# Patient Record
Sex: Male | Born: 1935 | Race: White | Hispanic: No | Marital: Married | Smoking: Former smoker
Health system: Southern US, Community
[De-identification: ages and names within clinical notes are randomized; demographics above are authoritative.]

## PROBLEM LIST (undated history)

## (undated) DIAGNOSIS — H9190 Unspecified hearing loss, unspecified ear: Secondary | ICD-10-CM

## (undated) DIAGNOSIS — Z9889 Other specified postprocedural states: Secondary | ICD-10-CM

## (undated) DIAGNOSIS — M1990 Unspecified osteoarthritis, unspecified site: Secondary | ICD-10-CM

## (undated) DIAGNOSIS — G4730 Sleep apnea, unspecified: Secondary | ICD-10-CM

## (undated) DIAGNOSIS — I255 Ischemic cardiomyopathy: Secondary | ICD-10-CM

## (undated) DIAGNOSIS — I739 Peripheral vascular disease, unspecified: Secondary | ICD-10-CM

## (undated) DIAGNOSIS — K219 Gastro-esophageal reflux disease without esophagitis: Secondary | ICD-10-CM

## (undated) DIAGNOSIS — G629 Polyneuropathy, unspecified: Secondary | ICD-10-CM

## (undated) DIAGNOSIS — I480 Paroxysmal atrial fibrillation: Secondary | ICD-10-CM

## (undated) DIAGNOSIS — I2510 Atherosclerotic heart disease of native coronary artery without angina pectoris: Secondary | ICD-10-CM

## (undated) DIAGNOSIS — I10 Essential (primary) hypertension: Secondary | ICD-10-CM

## (undated) DIAGNOSIS — Z950 Presence of cardiac pacemaker: Secondary | ICD-10-CM

## (undated) DIAGNOSIS — I350 Nonrheumatic aortic (valve) stenosis: Secondary | ICD-10-CM

## (undated) DIAGNOSIS — E7800 Pure hypercholesterolemia, unspecified: Secondary | ICD-10-CM

## (undated) HISTORY — PX: REPLACEMENT TOTAL KNEE BILATERAL: SUR1225

## (undated) HISTORY — PX: MANDIBLE SURGERY: SHX707

## (undated) HISTORY — PX: JOINT REPLACEMENT: SHX530

## (undated) HISTORY — PX: NECK SURGERY: SHX720

## (undated) HISTORY — PX: KNEE SURGERY: SHX244

## (undated) HISTORY — PX: OTHER SURGICAL HISTORY: SHX169

## (undated) HISTORY — PX: INSERT / REPLACE / REMOVE PACEMAKER: SUR710

---

## 1998-05-09 ENCOUNTER — Encounter: Admission: RE | Admit: 1998-05-09 | Discharge: 1998-08-07 | Payer: Self-pay | Admitting: Pulmonary Disease

## 1999-03-08 ENCOUNTER — Encounter: Admission: RE | Admit: 1999-03-08 | Discharge: 1999-06-06 | Payer: Self-pay | Admitting: Anesthesiology

## 2001-11-13 HISTORY — PX: OTHER SURGICAL HISTORY: SHX169

## 2004-07-04 ENCOUNTER — Ambulatory Visit: Payer: Self-pay | Admitting: Unknown Physician Specialty

## 2006-04-23 ENCOUNTER — Ambulatory Visit: Payer: Self-pay | Admitting: Internal Medicine

## 2007-06-17 ENCOUNTER — Ambulatory Visit: Payer: Self-pay | Admitting: Vascular Surgery

## 2007-07-02 ENCOUNTER — Ambulatory Visit: Payer: Self-pay | Admitting: Unknown Physician Specialty

## 2010-06-15 ENCOUNTER — Ambulatory Visit: Payer: Self-pay | Admitting: Internal Medicine

## 2010-06-28 ENCOUNTER — Ambulatory Visit: Payer: Self-pay | Admitting: Pain Medicine

## 2010-06-28 ENCOUNTER — Ambulatory Visit: Payer: Self-pay | Admitting: Internal Medicine

## 2010-10-26 ENCOUNTER — Other Ambulatory Visit: Payer: Self-pay | Admitting: Internal Medicine

## 2010-10-26 DIAGNOSIS — M545 Low back pain: Secondary | ICD-10-CM

## 2010-11-01 ENCOUNTER — Other Ambulatory Visit: Payer: Self-pay | Admitting: Internal Medicine

## 2010-11-01 ENCOUNTER — Ambulatory Visit
Admission: RE | Admit: 2010-11-01 | Discharge: 2010-11-01 | Disposition: A | Discharge status: Home / Self Care | Payer: Medicare Other | Source: Ambulatory Visit | Attending: Internal Medicine | Admitting: Internal Medicine

## 2010-11-01 DIAGNOSIS — M545 Low back pain: Secondary | ICD-10-CM

## 2010-11-01 DIAGNOSIS — M549 Dorsalgia, unspecified: Secondary | ICD-10-CM

## 2010-11-22 ENCOUNTER — Ambulatory Visit
Admission: RE | Admit: 2010-11-22 | Discharge: 2010-11-22 | Disposition: A | Discharge status: Home / Self Care | Payer: Medicare Other | Source: Ambulatory Visit | Attending: Internal Medicine | Admitting: Internal Medicine

## 2010-11-22 DIAGNOSIS — M545 Low back pain: Secondary | ICD-10-CM

## 2011-04-23 ENCOUNTER — Other Ambulatory Visit: Payer: Self-pay | Admitting: Internal Medicine

## 2011-04-23 DIAGNOSIS — M549 Dorsalgia, unspecified: Secondary | ICD-10-CM

## 2011-04-26 ENCOUNTER — Ambulatory Visit
Admission: RE | Admit: 2011-04-26 | Discharge: 2011-04-26 | Disposition: A | Discharge status: Home / Self Care | Payer: Medicare Other | Source: Ambulatory Visit | Attending: Internal Medicine | Admitting: Internal Medicine

## 2011-04-26 DIAGNOSIS — M549 Dorsalgia, unspecified: Secondary | ICD-10-CM

## 2011-04-26 MED ORDER — IOHEXOL 180 MG/ML  SOLN
1.0000 mL | Freq: Once | INTRAMUSCULAR | Status: AC | PRN
  Administered 2011-04-26: 1 mL via EPIDURAL

## 2011-04-26 MED ORDER — METHYLPREDNISOLONE ACETATE 40 MG/ML INJ SUSP (RADIOLOG
120.0000 mg | Freq: Once | INTRAMUSCULAR | Status: AC
  Administered 2011-04-26: 120 mg via EPIDURAL

## 2011-09-01 ENCOUNTER — Encounter (HOSPITAL_COMMUNITY): Payer: Self-pay | Admitting: *Deleted

## 2011-09-01 ENCOUNTER — Emergency Department (HOSPITAL_COMMUNITY)
Admission: EM | Admit: 2011-09-01 | Discharge: 2011-09-01 | Disposition: A | Discharge status: Rehabilitation Facility | Payer: Medicare Other | Attending: Emergency Medicine | Admitting: Emergency Medicine

## 2011-09-01 DIAGNOSIS — I82409 Acute embolism and thrombosis of unspecified deep veins of unspecified lower extremity: Secondary | ICD-10-CM | POA: Insufficient documentation

## 2011-09-01 DIAGNOSIS — R609 Edema, unspecified: Secondary | ICD-10-CM | POA: Insufficient documentation

## 2011-09-01 DIAGNOSIS — Z96659 Presence of unspecified artificial knee joint: Secondary | ICD-10-CM | POA: Insufficient documentation

## 2011-09-01 DIAGNOSIS — M79609 Pain in unspecified limb: Secondary | ICD-10-CM | POA: Insufficient documentation

## 2011-09-01 DIAGNOSIS — E7800 Pure hypercholesterolemia, unspecified: Secondary | ICD-10-CM | POA: Insufficient documentation

## 2011-09-01 DIAGNOSIS — I10 Essential (primary) hypertension: Secondary | ICD-10-CM | POA: Insufficient documentation

## 2011-09-01 DIAGNOSIS — M25569 Pain in unspecified knee: Secondary | ICD-10-CM | POA: Insufficient documentation

## 2011-09-01 HISTORY — DX: Essential (primary) hypertension: I10

## 2011-09-01 HISTORY — DX: Pure hypercholesterolemia, unspecified: E78.00

## 2011-09-01 LAB — DIFFERENTIAL
Eosinophils Absolute: 0.5 10*3/uL (ref 0.0–0.7)
Eosinophils Relative: 5 % (ref 0–5)
Lymphs Abs: 1.5 10*3/uL (ref 0.7–4.0)
Monocytes Relative: 10 % (ref 3–12)

## 2011-09-01 LAB — CBC
Hemoglobin: 10.7 g/dL — ABNORMAL LOW (ref 13.0–17.0)
MCH: 30.9 pg (ref 26.0–34.0)
MCV: 89.6 fL (ref 78.0–100.0)
RBC: 3.46 MIL/uL — ABNORMAL LOW (ref 4.22–5.81)

## 2011-09-01 LAB — PROTIME-INR
INR: 1.17 (ref 0.00–1.49)
Prothrombin Time: 15.1 seconds (ref 11.6–15.2)

## 2011-09-01 LAB — BASIC METABOLIC PANEL
BUN: 13 mg/dL (ref 6–23)
Calcium: 9.1 mg/dL (ref 8.4–10.5)
GFR calc non Af Amer: 84 mL/min — ABNORMAL LOW (ref >90)
Glucose, Bld: 115 mg/dL — ABNORMAL HIGH (ref 70–99)
Potassium: 3.9 mEq/L (ref 3.5–5.1)

## 2011-09-01 MED ORDER — ENOXAPARIN SODIUM 80 MG/0.8ML ~~LOC~~ SOLN: 80.0000 mg | Freq: Two times a day (BID) | SUBCUTANEOUS | Status: DC

## 2011-09-01 MED ORDER — WARFARIN SODIUM 5 MG PO TABS
5.0000 mg | ORAL_TABLET | Freq: Once | ORAL | Status: AC
  Administered 2011-09-01: 5 mg via ORAL
  Filled 2011-09-01 (×2): qty 1

## 2011-09-01 MED ORDER — WARFARIN SODIUM 5 MG PO TABS: 5.0000 mg | ORAL_TABLET | Freq: Once | ORAL | Status: DC

## 2011-09-01 MED ORDER — ENOXAPARIN SODIUM 80 MG/0.8ML ~~LOC~~ SOLN
80.0000 mg | Freq: Once | SUBCUTANEOUS | Status: AC
  Administered 2011-09-01: 80 mg via SUBCUTANEOUS
  Filled 2011-09-01 (×2): qty 0.8

## 2011-09-01 MED ORDER — HYDROCODONE-ACETAMINOPHEN 5-325 MG PO TABS
2.0000 | ORAL_TABLET | Freq: Once | ORAL | Status: AC
  Administered 2011-09-01: 2 via ORAL
  Filled 2011-09-01: qty 2

## 2011-09-01 NOTE — ED Notes (Signed)
Per EMS, pt from [facility] c/o left knee pain and swelling for 2 days, pt had surgery on left knee at [facility] about a week ago.

## 2011-09-01 NOTE — ED Notes (Signed)
Vascular tech at bedside. °

## 2011-09-01 NOTE — ED Notes (Signed)
JYN:[id number]<AO> Expected date:<BR> Expected time:<BR> Means of arrival:<BR> Comments:<BR> EMS post surgery

## 2011-09-01 NOTE — ED Notes (Signed)
Misty Stanley, Unit Secretary aware of venous duplex, to call vascular tech, will monitor.

## 2011-09-01 NOTE — ED Provider Notes (Signed)
History     CSN: [id number]  Arrival date & time 09/01/11  1024   First MD Initiated Contact with Patient 09/01/11 1033      Chief Complaint  Patient presents with  . Knee Pain    Left    (Consider location/radiation/quality/duration/timing/severity/associated sxs/prior treatment) HPI Comments: The patient presents today with a history of left calf pain x4 days. He underwent a total knee replacement for his left knee at [facility] 8 days ago and has been going to physical therapy everyday at his rehab facility since the surgery. About 4 days ago, at therapy, he noticed pain with dorsiflexion of his left foot and tenderness of his left calf. The onset was gradual and the pain has been worsening for the past 4 days. He describes the pain as achy as rest and sharp with dorsiflexion of the left foot. He denies any fever, CP, shortness of breath.   Patient is a 78 y.o. male presenting with knee pain. The history is provided by the patient.  Knee Pain Pertinent negatives include no chest pain or fever.    Past Medical History  Diagnosis Date  . Hypertension   . High cholesterol     Past Surgical History  Procedure Date  . Knee surgery     Left  . Neck surgery   . Carotid endarderectomy     Bilateral    History reviewed. No pertinent family history.  History  Substance Use Topics  . Smoking status: Former Smoker    Types: Cigarettes    Quit date: 08/31/1984  . Smokeless tobacco: Never Used  . Alcohol Use: No      Review of Systems  Constitutional: Negative for fever.  Respiratory: Negative for shortness of breath and wheezing.   Cardiovascular: Negative for chest pain.  All other systems reviewed and are negative.    Allergies  Percocet; Iodinated diagnostic agents; and Iodine  Home Medications   Current Outpatient Rx  Name Route Sig Dispense Refill  . AMLODIPINE BESY-BENAZEPRIL HCL 10-20 MG PO CAPS Oral Take 1 capsule by mouth daily.    . ASPIRIN EC 81 MG PO  TBEC Oral Take 81 mg by mouth daily.    . ATENOLOL 25 MG PO TABS Oral Take 25 mg by mouth daily.    Marland Kitchen CILOSTAZOL 100 MG PO TABS Oral Take 100 mg by mouth 2 (two) times daily.    Marland Kitchen CINNAMON 500 MG PO TABS Oral Take 500 mg by mouth daily.    . DUTASTERIDE 0.5 MG PO CAPS Oral Take 0.5 mg by mouth daily.    Marland Kitchen HYDROCODONE-ACETAMINOPHEN 7.5-325 MG PO TABS Oral Take 1 tablet by mouth every 6 (six) hours as needed. Take 1 tablet by mouth every 4 to 6 hours as needed for pain    . METHOCARBAMOL 500 MG PO TABS Oral Take 500 mg by mouth 3 (three) times daily as needed. For muscle spasms    . NIACIN ER (ANTIHYPERLIPIDEMIC) 1000 MG PO TBCR Oral Take 1,000 mg by mouth at bedtime.    . OXYCODONE-ACETAMINOPHEN 10-325 MG PO TABS Oral Take 1 tablet by mouth every 4 (four) hours as needed.      BP 138/73  Pulse 94  Temp(Src) 98 F (36.7 C) (Oral)  Resp 18  SpO2 99%  Physical Exam  Nursing note and vitals reviewed. Constitutional: He is oriented to person, place, and time. He appears well-developed and well-nourished.  HENT:  Head: Normocephalic and atraumatic.  Neck: Neck supple.  Cardiovascular:  Normal rate, regular rhythm and normal heart sounds.   Pulmonary/Chest: Breath sounds normal. No respiratory distress. He has no wheezes. He has no rales. He exhibits no tenderness.  Abdominal: Soft. Bowel sounds are normal. There is no tenderness.  Musculoskeletal:       Left lower leg: He exhibits tenderness and edema. He exhibits no bony tenderness.       Left knee with surgical incision, staples in place.  Incision is clean, dry, and intact.  Pt has appropriate active ROM for 8 days postop.  Distal pulses are intact.  Left calf with mild swelling, diffuse tenderness to palpation.    Neurological: He is alert and oriented to person, place, and time.    ED Course  Procedures (including critical care time)  Labs Reviewed  CBC - Abnormal; Notable for the following:    RBC 3.46 (*)    Hemoglobin 10.7 (*)     HCT 31.0 (*)    Platelets 448 (*)    All other components within normal limits  BASIC METABOLIC PANEL - Abnormal; Notable for the following:    Glucose, Bld 115 (*)    GFR calc non Af Amer 84 (*)    All other components within normal limits  DIFFERENTIAL  PROTIME-INR   No results found.  11:36 AM Patient seen and examined, discussed with Dr Juleen China who has also seen the patient.   12:22 PM Pt positive for DVT.   Dr Juleen China and I have both discussed the diagnosis and plan with patient.  Patient verbalizes understanding of treatment and plan.     1. DVT (deep venous thrombosis)       MDM  Patient with hx recent total left knee replacement with 3 days of left calf pain and mild swelling.  [place] is positive for early non-occlusive DVT.  Patient's surgical incision is clean, dry, and intact, the knee swelling appears appropriate for post-op day 8.  Pt is afebrile, does not have increased knee pain or complaints associated with his knee - doubt postop infection or septic joint.  Patient also denies chest pain and SOB - doubt PE.  Pt started on Lovenox and coumadin in ED and therapy explained to patient.  Pt currently in [facility] for rehab.  Pt have INR in 3-5 days at [facility] and will continue with PCP in [facility] for future INR checks and therapy adjustment.  Pt d/c back to rehab.  Patient verbalizes understanding and agrees with plan.          Rise Patience, [place] 09/01/11 1421

## 2011-09-01 NOTE — ED Provider Notes (Signed)
Medical screening examination/treatment/procedure(s) were conducted as a shared visit with non-physician practitioner(s) and myself.  I personally evaluated the patient during the encounter  73yM male with left calf pain. Patient is status post left knee surgery on February 7. Patient began having worsening calf pain about 2-3 days ago. Gradual onset and progressive. Pain is worse with palpation to the area and when ambulating. No fevers or chills.  No increased redness or drainage from incision. No nausea or vomiting. No numbness . Consider septic joint but doubt. Patient states his knee pain has been stable since surgery. There is some mild pain with range of motion of his knee but this seems appropriate given recent surgery. He is afebrile. Clinically well appearing. Ultrasound was done to evaluate for DVT. Discussed the ultrasound tech and she states that there may be early thrombus formation. Will start patient on Lovenox with a bridge to Coumadin. Outpatient followup.   Raeford Razor, MD 09/01/11 [phone]

## 2011-09-01 NOTE — Discharge Instructions (Signed)
Continue to take your previously prescribed pain medication as directed for pain.  Please take the coumadin by mouth once daily and the injection in your belly twice daily.  You will need to have your blood drawn for an INR check in 3-5 days and your doctor may then need to adjust your coumadin dosage.  If you develop chest pain or shortness of breath, return to the ER immediately.  You may return to the ER at any time for worsening condition or any new symptoms that concern you.       Deep Vein Thrombosis A deep vein thrombosis (DVT) is a blood clot (thrombus) that develops in a deep vein. A DVT is a clot in the deep, larger veins of the leg, arm, or pelvis. These are more dangerous than clots that might form in veins on the surface of the body. Deep vein thrombosis can lead to complications if the clot breaks off and travels in the bloodstream to the lungs. CAUSES Blood clots form in a vein for different reasons. Usually several things cause blood clots. They include:  The flow of blood slows down.   The inside of the vein is damaged in some way.   The person has a condition that makes blood clot more easily. These conditions may include:   Older age (especially over 52 years old).   Having a history of blood clots.   Having major or lengthy surgery. Hip surgery is particularly high-risk.   Breaking a hip or leg.   Sitting or lying still for a long time.   Cancer or cancer treatment.   Having a long, thin tube (catheter) placed inside a vein during a medical procedure.   Being overweight (obese).   Pregnancy and childbirth.   Medicines with estrogen.   Smoking.   Other circulation or heart problems.  SYMPTOMS When a clot forms, it can either partially or totally block the blood flow in that vein. Symptoms of a DVT can include:  Swelling of the leg or arm, especially if one side is much worse.   Warmth and redness of the leg or arm, especially if one side is much worse.     Pain in an arm or leg. If the clot is in the leg, symptoms may be more noticeable or worse when standing or walking.  If the blood clot travels to the lung, it may cause:  Shortness of breath.   Chest pain. The pain may be worsened by deep breaths.   Coughing up thick mucus (phlegm), possibly flecked with blood.  Anyone with these symptoms should get emergency medical treatment right away. Call your local emergency services (911 in [place] if you have these symptoms. DIAGNOSIS If a DVT is suspected, your caregiver will take a full medical history. He or she will also perform a physical exam. Tests that also may be required include:  Studies of the clotting properties of the blood.   An ultrasound scan.   X-rays to show the flow of blood when special dye is injected into the veins (venography).   Studies of your lungs if you have any chest symptoms.  PREVENTION  Exercise the legs regularly. Take a brisk 30 minute walk every day.   Maintain a weight that is appropriate for your height.   Avoid sitting or lying in bed for long periods of time without moving your legs.   Women, particularly those over the age of 70, should consider the risks and benefits of taking estrogen  medicines, including birth control pills.   Do not smoke, especially if you take estrogen medicines.   Long-distance travel can increase your risk. You should exercise your legs by walking or pumping the muscles every hour.   In hospital prevention:   Prevention may include medical and nonmedical measures.  TREATMENT  The most common treatment for DVT is blood thinning (anticoagulant) medicine, which reduces the blood's tendency to clot. Anticoagulants can stop new blood clots from forming and old ones from growing. They cannot dissolve existing clots. Your body does this by itself over time. Anticoagulants can be given by mouth, by intravenous (IV) access, or by injection. Your caregiver will determine the best  program for you.   Less commonly, clot-dissolving drugs (thrombolytics) are used to dissolve a DVT. They carry a high risk of bleeding, so they are used mainly in severe cases.   Very rarely, a blood clot in the leg needs to be removed surgically.   If you are unable to take anticoagulants, your caregiver may arrange for you to have a filter placed in a main vein in your belly (abdomen). This filter prevents clots from traveling to your lungs.  HOME CARE INSTRUCTIONS  Take all medicines prescribed by your caregiver. Follow the directions carefully.   You will most likely continue taking anticoagulants after you leave the hospital. Your caregiver will advise you on the length of treatment (usually 3 to 6 months, sometimes for life).   Taking too much or too little of an anticoagulant is dangerous. While taking this type of medicine, you will need to have regular blood tests to be sure the dose is correct. The dose can change for many reasons. It is critically important that you take this medicine exactly as prescribed, and that you have blood tests exactly as directed.   Many foods can interfere with anticoagulants. These include foods high in vitamin K, such as spinach, kale, broccoli, cabbage, collard and turnip greens, Brussels sprouts, peas, cauliflower, seaweed, parsley, beef and pork liver, green tea, and soybean oil. Your caregiver should discuss limits on these foods with you or you should arrange a visit with a dietician to answer your questions.   Many medicines can interfere with anticoagulants. You must tell your caregiver about any and all medicines you take. This includes all vitamins and supplements. Be especially cautious with aspirin and anti-inflammatory medicines. Ask your caregiver before taking these.   Anticoagulants can have side effects, mostly excessive bruising or bleeding. You will need to hold pressure over cuts for longer than usual. Avoid alcoholic drinks or consume  only very small amounts while taking this medicine.   If you are taking an anticoagulant:   Wear a medical alert bracelet.   Notify your dentist or other caregivers before procedures.   Avoid contact sports.   Ask your caregiver how soon you can go back to normal activities. Not being active can lead to new clots. Ask for a list of what you should and should not do.   Exercise your lower leg muscles. This is important while traveling.   You may need to wear compression stockings. These are tight elastic stockings that apply pressure to the lower legs. This can help keep the blood in the legs from clotting.   If you are a smoker, you should quit.   Learn as much as you can about DVT.  SEEK MEDICAL CARE IF:  You have unusual bruising or any bleeding problems.   The swelling or pain  in your affected arm or leg is not gradually improving.   You anticipate surgery or long-distance travel. You should get specific advice on DVT prevention.   You discover other family members with blood clots. This may require further testing for inherited diseases or conditions.  SEEK IMMEDIATE MEDICAL CARE IF:  You develop chest pain.   You develop severe shortness of breath.   You begin to cough up bloody mucus or phlegm (sputum).   You feel dizzy or faint.   You develop swelling or pain in the leg.   You have breathing problems after traveling.  MAKE SURE YOU:  Understand these instructions.   Will watch your condition.   Will get help right away if you are not doing well or get worse.  Document Released: 07/02/2005 Document Revised: 03/14/2011 Document Reviewed: 08/24/2010 [facility] Patient Information 2012 [place], [place].

## 2011-09-01 NOTE — Progress Notes (Signed)
VASCULAR LAB PRELIMINARY  PRELIMINARY  PRELIMINARY  PRELIMINARY  Left lower extremity venous Doppler completed.    Preliminary report:  There appears to be early, non occlusive, acute DVT in the mid posterior tibial vein of the left lower extremity.  All other vein appear thrombus free.  Sherren Kerns [place], 09/01/2011, 12:10 PM

## 2011-09-01 NOTE — Progress Notes (Signed)
PHARMACIST - PHYSICIAN COMMUNICATION CONCERNING: Pharmacy Care Issues Regarding Warfarin Labs  RECOMMENDATION (Action Taken): A baseline and daily protime for three days has been ordered to meet the [facility] Patient safety goal and comply with the current [facility] Pharmacy & Therapeutics Committee policy.   The Pharmacy will defer all warfarin dose order changes and follow up of lab results to the prescriber unless an additional order to initiate a "pharmacy Coumadin consult" is placed.  DESCRIPTION:  While hospitalized, to be in compliance with The Joint Commission National Patient Safety Goals, all patients on warfarin must have a baseline and/or current protime prior to the administration of warfarin. Pharmacy has received your order for warfarin without these required laboratory assessments.   Geoffry Paradise, PharmD.   Pager:  [phone] 1:11 PM

## 2011-09-02 NOTE — ED Provider Notes (Signed)
Medical screening examination/treatment/procedure(s) were conducted as a shared visit with non-physician practitioner(s) and myself.  I personally evaluated the patient during the encounter.  Please see completed note for this encounter.  Raeford Razor, MD 09/02/11 [phone]

## 2011-12-24 DIAGNOSIS — Z8249 Family history of ischemic heart disease and other diseases of the circulatory system: Secondary | ICD-10-CM | POA: Insufficient documentation

## 2012-02-28 ENCOUNTER — Other Ambulatory Visit: Payer: Self-pay | Admitting: Internal Medicine

## 2012-02-28 DIAGNOSIS — M5416 Radiculopathy, lumbar region: Secondary | ICD-10-CM

## 2012-03-12 ENCOUNTER — Ambulatory Visit
Admission: RE | Admit: 2012-03-12 | Discharge: 2012-03-12 | Disposition: A | Discharge status: Home / Self Care | Payer: Medicare Other | Source: Ambulatory Visit | Attending: Internal Medicine | Admitting: Internal Medicine

## 2012-03-12 DIAGNOSIS — M5416 Radiculopathy, lumbar region: Secondary | ICD-10-CM

## 2012-03-12 MED ORDER — METHYLPREDNISOLONE ACETATE 40 MG/ML INJ SUSP (RADIOLOG
120.0000 mg | Freq: Once | INTRAMUSCULAR | Status: AC
  Administered 2012-03-12: 120 mg via EPIDURAL

## 2012-03-12 MED ORDER — IOHEXOL 180 MG/ML  SOLN
1.0000 mL | Freq: Once | INTRAMUSCULAR | Status: AC | PRN
  Administered 2012-03-12: 1 mL via EPIDURAL

## 2012-04-07 DIAGNOSIS — N403 Nodular prostate with lower urinary tract symptoms: Secondary | ICD-10-CM | POA: Insufficient documentation

## 2012-04-07 DIAGNOSIS — N138 Other obstructive and reflux uropathy: Secondary | ICD-10-CM | POA: Insufficient documentation

## 2012-04-11 ENCOUNTER — Other Ambulatory Visit: Payer: Self-pay | Admitting: Internal Medicine

## 2012-04-11 DIAGNOSIS — M5416 Radiculopathy, lumbar region: Secondary | ICD-10-CM

## 2012-04-23 ENCOUNTER — Ambulatory Visit
Admission: RE | Admit: 2012-04-23 | Discharge: 2012-04-23 | Disposition: A | Discharge status: Home / Self Care | Payer: Medicare Other | Source: Ambulatory Visit | Attending: Internal Medicine | Admitting: Internal Medicine

## 2012-04-23 DIAGNOSIS — M5416 Radiculopathy, lumbar region: Secondary | ICD-10-CM

## 2012-04-23 MED ORDER — IOHEXOL 180 MG/ML  SOLN
1.0000 mL | Freq: Once | INTRAMUSCULAR | Status: AC | PRN
  Administered 2012-04-23: 1 mL via EPIDURAL

## 2012-04-23 MED ORDER — METHYLPREDNISOLONE ACETATE 40 MG/ML INJ SUSP (RADIOLOG
120.0000 mg | Freq: Once | INTRAMUSCULAR | Status: AC
  Administered 2012-04-23: 120 mg via EPIDURAL

## 2012-11-27 ENCOUNTER — Ambulatory Visit: Payer: Self-pay | Admitting: Unknown Physician Specialty

## 2012-11-28 LAB — PATHOLOGY REPORT

## 2014-09-29 DIAGNOSIS — I10 Essential (primary) hypertension: Secondary | ICD-10-CM | POA: Insufficient documentation

## 2014-09-29 DIAGNOSIS — E782 Mixed hyperlipidemia: Secondary | ICD-10-CM | POA: Insufficient documentation

## 2016-05-14 DIAGNOSIS — I739 Peripheral vascular disease, unspecified: Secondary | ICD-10-CM | POA: Insufficient documentation

## 2016-05-14 DIAGNOSIS — N400 Enlarged prostate without lower urinary tract symptoms: Secondary | ICD-10-CM | POA: Insufficient documentation

## 2016-05-14 DIAGNOSIS — Z86718 Personal history of other venous thrombosis and embolism: Secondary | ICD-10-CM | POA: Insufficient documentation

## 2016-05-31 DIAGNOSIS — Z96651 Presence of right artificial knee joint: Secondary | ICD-10-CM | POA: Insufficient documentation

## 2016-10-24 DIAGNOSIS — I350 Nonrheumatic aortic (valve) stenosis: Secondary | ICD-10-CM | POA: Insufficient documentation

## 2016-11-01 ENCOUNTER — Other Ambulatory Visit: Payer: Self-pay | Admitting: Ophthalmology

## 2016-11-01 DIAGNOSIS — G43111 Migraine with aura, intractable, with status migrainosus: Secondary | ICD-10-CM

## 2016-11-07 ENCOUNTER — Ambulatory Visit
Admission: RE | Admit: 2016-11-07 | Discharge: 2016-11-07 | Disposition: A | Discharge status: Home / Self Care | Payer: Medicare Other | Source: Ambulatory Visit | Attending: Ophthalmology | Admitting: Ophthalmology

## 2016-11-07 ENCOUNTER — Ambulatory Visit: Admission: RE | Admit: 2016-11-07 | Payer: Medicare Other | Source: Ambulatory Visit

## 2016-11-07 DIAGNOSIS — R51 Headache: Secondary | ICD-10-CM | POA: Diagnosis not present

## 2016-11-07 DIAGNOSIS — G43111 Migraine with aura, intractable, with status migrainosus: Secondary | ICD-10-CM

## 2016-11-07 MED ORDER — IOPAMIDOL (ISOVUE-300) INJECTION 61%
75.0000 mL | Freq: Once | INTRAVENOUS | Status: AC | PRN
  Administered 2016-11-07: 75 mL via INTRAVENOUS

## 2017-04-26 ENCOUNTER — Emergency Department: Payer: Medicare Other

## 2017-04-26 ENCOUNTER — Inpatient Hospital Stay
Admission: EM | Admit: 2017-04-26 | Discharge: 2017-04-29 | DRG: 286 | Disposition: A | Discharge status: Other Acute Inpatient Hospital | Payer: Medicare Other | Attending: Internal Medicine | Admitting: Internal Medicine

## 2017-04-26 DIAGNOSIS — I248 Other forms of acute ischemic heart disease: Secondary | ICD-10-CM | POA: Diagnosis present

## 2017-04-26 DIAGNOSIS — I4891 Unspecified atrial fibrillation: Principal | ICD-10-CM | POA: Diagnosis present

## 2017-04-26 DIAGNOSIS — I739 Peripheral vascular disease, unspecified: Secondary | ICD-10-CM | POA: Diagnosis present

## 2017-04-26 DIAGNOSIS — Z885 Allergy status to narcotic agent status: Secondary | ICD-10-CM

## 2017-04-26 DIAGNOSIS — H9190 Unspecified hearing loss, unspecified ear: Secondary | ICD-10-CM | POA: Diagnosis present

## 2017-04-26 DIAGNOSIS — I110 Hypertensive heart disease with heart failure: Secondary | ICD-10-CM | POA: Diagnosis present

## 2017-04-26 DIAGNOSIS — I5023 Acute on chronic systolic (congestive) heart failure: Secondary | ICD-10-CM | POA: Diagnosis present

## 2017-04-26 DIAGNOSIS — I509 Heart failure, unspecified: Secondary | ICD-10-CM

## 2017-04-26 DIAGNOSIS — Z91041 Radiographic dye allergy status: Secondary | ICD-10-CM | POA: Diagnosis not present

## 2017-04-26 DIAGNOSIS — E876 Hypokalemia: Secondary | ICD-10-CM | POA: Diagnosis present

## 2017-04-26 DIAGNOSIS — G629 Polyneuropathy, unspecified: Secondary | ICD-10-CM | POA: Diagnosis present

## 2017-04-26 DIAGNOSIS — J9601 Acute respiratory failure with hypoxia: Secondary | ICD-10-CM | POA: Diagnosis present

## 2017-04-26 DIAGNOSIS — K8020 Calculus of gallbladder without cholecystitis without obstruction: Secondary | ICD-10-CM | POA: Diagnosis present

## 2017-04-26 DIAGNOSIS — R0902 Hypoxemia: Secondary | ICD-10-CM

## 2017-04-26 DIAGNOSIS — Z87891 Personal history of nicotine dependence: Secondary | ICD-10-CM

## 2017-04-26 DIAGNOSIS — K824 Cholesterolosis of gallbladder: Secondary | ICD-10-CM | POA: Diagnosis present

## 2017-04-26 DIAGNOSIS — I2510 Atherosclerotic heart disease of native coronary artery without angina pectoris: Secondary | ICD-10-CM | POA: Diagnosis present

## 2017-04-26 DIAGNOSIS — R17 Unspecified jaundice: Secondary | ICD-10-CM

## 2017-04-26 DIAGNOSIS — E7800 Pure hypercholesterolemia, unspecified: Secondary | ICD-10-CM | POA: Diagnosis present

## 2017-04-26 DIAGNOSIS — I350 Nonrheumatic aortic (valve) stenosis: Secondary | ICD-10-CM | POA: Diagnosis present

## 2017-04-26 HISTORY — DX: Other specified postprocedural states: Z98.890

## 2017-04-26 HISTORY — DX: Unspecified hearing loss, unspecified ear: H91.90

## 2017-04-26 HISTORY — DX: Polyneuropathy, unspecified: G62.9

## 2017-04-26 HISTORY — DX: Peripheral vascular disease, unspecified: I73.9

## 2017-04-26 LAB — COMPREHENSIVE METABOLIC PANEL
ALK PHOS: 79 U/L (ref 38–126)
ALT: 23 U/L (ref 17–63)
ANION GAP: 11 (ref 5–15)
AST: 31 U/L (ref 15–41)
Albumin: 4.5 g/dL (ref 3.5–5.0)
BILIRUBIN TOTAL: 2.4 mg/dL — AB (ref 0.3–1.2)
BUN: 17 mg/dL (ref 6–20)
CALCIUM: 9.5 mg/dL (ref 8.9–10.3)
CO2: 20 mmol/L — AB (ref 22–32)
Chloride: 109 mmol/L (ref 101–111)
Creatinine, Ser: 1.23 mg/dL (ref 0.61–1.24)
GFR calc non Af Amer: 53 mL/min — ABNORMAL LOW (ref >60)
Glucose, Bld: 149 mg/dL — ABNORMAL HIGH (ref 65–99)
POTASSIUM: 3.7 mmol/L (ref 3.5–5.1)
SODIUM: 140 mmol/L (ref 135–145)
TOTAL PROTEIN: 7.4 g/dL (ref 6.5–8.1)

## 2017-04-26 LAB — CBC
HCT: 45.2 % (ref 40.0–52.0)
HEMOGLOBIN: 15.3 g/dL (ref 13.0–18.0)
MCH: 30.3 pg (ref 26.0–34.0)
MCHC: 33.9 g/dL (ref 32.0–36.0)
MCV: 89.3 fL (ref 80.0–100.0)
Platelets: 275 10*3/uL (ref 150–440)
RBC: 5.06 MIL/uL (ref 4.40–5.90)
RDW: 14.6 % — AB (ref 11.5–14.5)
WBC: 12.7 10*3/uL — ABNORMAL HIGH (ref 3.8–10.6)

## 2017-04-26 LAB — TROPONIN I
TROPONIN I: 0.06 ng/mL — AB (ref <0.03)
Troponin I: <0.03 ng/mL (ref <0.03)
Troponin I: 0.08 ng/mL (ref <0.03)

## 2017-04-26 LAB — PROTIME-INR
INR: 1.17
PROTHROMBIN TIME: 14.8 s (ref 11.4–15.2)

## 2017-04-26 LAB — APTT: APTT: 28 s (ref 24–36)

## 2017-04-26 LAB — MAGNESIUM: MAGNESIUM: 2 mg/dL (ref 1.7–2.4)

## 2017-04-26 LAB — TSH: TSH: 1.882 u[IU]/mL (ref 0.350–4.500)

## 2017-04-26 LAB — BRAIN NATRIURETIC PEPTIDE: B NATRIURETIC PEPTIDE 5: 304 pg/mL — AB (ref 0.0–100.0)

## 2017-04-26 MED ORDER — HEPARIN BOLUS VIA INFUSION
4400.0000 [IU] | Freq: Once | INTRAVENOUS | Status: DC
  Filled 2017-04-26: qty 4400

## 2017-04-26 MED ORDER — HEPARIN (PORCINE) IN NACL 100-0.45 UNIT/ML-% IJ SOLN
14.0000 [IU]/kg/h | INTRAMUSCULAR | Status: DC
  Filled 2017-04-26: qty 250

## 2017-04-26 MED ORDER — DEXTROSE 5 % IV SOLN
5.0000 mg/h | Freq: Once | INTRAVENOUS | Status: AC
  Administered 2017-04-26: 5 mg/h via INTRAVENOUS
  Filled 2017-04-26: qty 100

## 2017-04-26 MED ORDER — ALBUTEROL SULFATE (2.5 MG/3ML) 0.083% IN NEBU: 5.0000 mg | INHALATION_SOLUTION | Freq: Once | RESPIRATORY_TRACT | Status: DC

## 2017-04-26 MED ORDER — ONDANSETRON HCL 4 MG PO TABS: 4.0000 mg | ORAL_TABLET | Freq: Four times a day (QID) | ORAL | Status: DC | PRN

## 2017-04-26 MED ORDER — APIXABAN 5 MG PO TABS
5.0000 mg | ORAL_TABLET | Freq: Two times a day (BID) | ORAL | Status: DC
  Administered 2017-04-26 – 2017-04-27 (×3): 5 mg via ORAL
  Filled 2017-04-26 (×3): qty 1

## 2017-04-26 MED ORDER — DILTIAZEM HCL 30 MG PO TABS
60.0000 mg | ORAL_TABLET | Freq: Four times a day (QID) | ORAL | Status: DC
  Administered 2017-04-26 – 2017-04-27 (×3): 60 mg via ORAL
  Filled 2017-04-26 (×3): qty 2

## 2017-04-26 MED ORDER — ALBUTEROL SULFATE (2.5 MG/3ML) 0.083% IN NEBU
2.5000 mg | INHALATION_SOLUTION | RESPIRATORY_TRACT | Status: DC | PRN
  Administered 2017-04-26 – 2017-04-27 (×2): 2.5 mg via RESPIRATORY_TRACT
  Filled 2017-04-26 (×2): qty 3

## 2017-04-26 MED ORDER — FUROSEMIDE 10 MG/ML IJ SOLN
60.0000 mg | Freq: Two times a day (BID) | INTRAMUSCULAR | Status: DC
  Administered 2017-04-26 – 2017-04-28 (×5): 60 mg via INTRAVENOUS
  Filled 2017-04-26 (×5): qty 6

## 2017-04-26 MED ORDER — ACETAMINOPHEN 650 MG RE SUPP: 650.0000 mg | Freq: Four times a day (QID) | RECTAL | Status: DC | PRN

## 2017-04-26 MED ORDER — FUROSEMIDE 10 MG/ML IJ SOLN
60.0000 mg | Freq: Once | INTRAMUSCULAR | Status: AC
  Administered 2017-04-26: 60 mg via INTRAVENOUS
  Filled 2017-04-26: qty 8

## 2017-04-26 MED ORDER — ONDANSETRON HCL 4 MG/2ML IJ SOLN: 4.0000 mg | Freq: Four times a day (QID) | INTRAMUSCULAR | Status: DC | PRN

## 2017-04-26 MED ORDER — DILTIAZEM HCL 25 MG/5ML IV SOLN
10.0000 mg | Freq: Once | INTRAVENOUS | Status: AC
  Administered 2017-04-26: 10 mg via INTRAVENOUS
  Filled 2017-04-26: qty 5

## 2017-04-26 MED ORDER — ACETAMINOPHEN 325 MG PO TABS: 650.0000 mg | ORAL_TABLET | Freq: Four times a day (QID) | ORAL | Status: DC | PRN

## 2017-04-26 MED ORDER — POLYETHYLENE GLYCOL 3350 17 G PO PACK
17.0000 g | PACK | Freq: Every day | ORAL | Status: DC | PRN
  Administered 2017-04-28: 17 g via ORAL
  Filled 2017-04-26: qty 1

## 2017-04-26 MED ORDER — SODIUM CHLORIDE 0.9% FLUSH
3.0000 mL | Freq: Two times a day (BID) | INTRAVENOUS | Status: DC
  Administered 2017-04-26 – 2017-04-28 (×6): 3 mL via INTRAVENOUS

## 2017-04-26 MED ORDER — METOPROLOL TARTRATE 25 MG PO TABS
25.0000 mg | ORAL_TABLET | Freq: Two times a day (BID) | ORAL | Status: DC
  Administered 2017-04-26 – 2017-04-29 (×7): 25 mg via ORAL
  Filled 2017-04-26 (×7): qty 1

## 2017-04-26 NOTE — Progress Notes (Signed)
ANTICOAGULATION CONSULT NOTE  Pharmacy Consult for Heparin Indication: atrial fibrillation  Allergies  Allergen Reactions  . Percocet [Oxycodone-Acetaminophen] Other (See Comments)    Confusion/hallucinations  . Iodinated Diagnostic Agents Other (See Comments)     Was very "flushed", denies itching, swelling or hives. He states this was over 30 years ago with Ionic contrast and not the current non-ionic contrast. SPM    Patient Measurements: Height: 6' (182.9 cm) Weight: 195 lb (88.5 kg) IBW/kg (Calculated) : 77.6 Heparin Dosing Weight: 88.5 kg  Vital Signs: Temp: 97.7 F (36.5 C) (10/12 0904) Temp Source: Oral (10/12 0904) BP: 126/79 (10/12 1200) Pulse Rate: 92 (10/12 1200)  Labs:  Recent Labs  04/26/17 0920  HGB 15.3  HCT 45.2  PLT 275  CREATININE 1.23  TROPONINI <0.03    Estimated Creatinine Clearance: 50.8 mL/min (by C-G formula based on SCr of 1.23 mg/dL).   Medical History: Past Medical History:  Diagnosis Date  . H/O carotid endarterectomy   . Hearing loss   . High cholesterol   . Hypertension   . Neuropathy   . PAD (peripheral artery disease) [facility])     Assessment: 83 y/o M with new-onset atrial fibrillation and no h/o anticoagulants.   Goal of Therapy:  Heparin level 0.3-0.7 units/ml Monitor platelets by anticoagulation protocol: Yes   Plan:  Give 4400 units bolus x 1 Start heparin infusion at 1250 units/hr Check anti-Xa level in 8 hours and daily while on heparin Continue to monitor H&H and platelets  Luisa Hart D 04/26/2017,12:19 PM

## 2017-04-26 NOTE — Consult Note (Signed)
[facility] Cardiology  CARDIOLOGY CONSULT NOTE  Patient ID: Justin Mendez MRN: [id number] DOB/AGE: [birth year] 83 y.o.  Admit date: 04/26/2017 Referring Physician Sudini Primary Physician [facility] Primary Cardiologist  Reason for Consultation Atrial fibrillation  HPI: The patient is an 83 year old gentleman referred for evaluation of atrial fibrillation and congestive heart failure. The patient was seen by his primary care physician on 04/26/19 mg daily and aspirin. The patient returned today to [facility] ED with tachycardia and shortness of breath. The patient was noted to be in atrial fibrillation at a rapid ventricular rate of 130-140 bpm. This x-ray revealed evidence for pulmonary edema. She'll labs were notable for troponin less than 0.03. The patient denies chest pain. Most recent 2-D echocardiogram 04/26/2016 revealed LVEF of 45% with evidence of mild aortic stenosis, calculated aortic valve area 1.6 cm with a mean gradient 10 mmHg and peak gradient of 21 mmHg. The patient was started on a Cardizem drip, current heart rate is 90 to 100 BPM. Patient received a dose of intravenous Lasix with modest diuresis and overall clinical improvement.  Review of systems complete and found to be negative unless listed above     Past Medical History:  Diagnosis Date  . H/O carotid endarterectomy   . Hearing loss   . High cholesterol   . Hypertension   . Neuropathy   . PAD (peripheral artery disease) (HCC)     Past Surgical History:  Procedure Laterality Date  . carotid endarderectomy     Bilateral  . KNEE SURGERY     Left  . NECK SURGERY      Prescriptions Prior to Admission  Medication Sig Dispense Refill Last Dose  . aspirin EC 81 MG tablet Take 81 mg by mouth daily.   04/25/2017 at 0800  . cilostazol (PLETAL) 100 MG tablet Take 100 mg by mouth 2 (two) times daily.   04/25/2017 at 0800  . Cinnamon 500 MG capsule Take 500 mg by mouth daily.    04/25/2017 at 0800  . Co-Enzyme Q-10 30 MG CAPS Take  by mouth daily.    04/25/2017 at 0800  . diltiazem (DILACOR XR) 180 MG 24 hr capsule Take 180 mg by mouth daily.   04/25/2017 at 0800  . metoprolol succinate (TOPROL-XL) 50 MG 24 hr tablet Take 25 mg by mouth daily. Take with or immediately following a meal.   04/25/2017 at 0800  . niacin (NIASPAN) 1000 MG CR tablet Take 1,000 mg by mouth daily.   04/25/2017 at 0800  . HYDROcodone-acetaminophen (NORCO) 7.5-325 MG per tablet Take 1 tablet by mouth every 6 (six) hours as needed. Take 1 tablet by mouth every 4 to 6 hours as needed for pain   Not Taking at Unknown time  . traMADol (ULTRAM) 50 MG tablet Take by mouth every 8 (eight) hours as needed.   prn at prn   Social History   Social History  . Marital status: Married    Spouse name: N/A  . Number of children: N/A  . Years of education: N/A   Occupational History  . Not on file.   Social History Main Topics  . Smoking status: Former Smoker    Types: Cigarettes    Quit date: 08/31/1984  . Smokeless tobacco: Never Used  . Alcohol use No  . Drug use: No  . Sexual activity: Not on file   Other Topics Concern  . Not on file   Social History Narrative  . No narrative on file    Family  History  Problem Relation Age of Onset  . Lung cancer Father       Review of systems complete and found to be negative unless listed above      PHYSICAL EXAM  General: Well developed, well nourished, in no acute distress HEENT:  Normocephalic and atramatic Neck:  No JVD.  Lungs: Clear bilaterally to auscultation and percussion. Heart: HRRR . Normal S1 and S2 without gallops or murmurs.  Abdomen: Bowel sounds are positive, abdomen soft and non-tender  Msk:  Back normal, normal gait. Normal strength and tone for age. Extremities: No clubbing, cyanosis or edema.   Neuro: Alert and oriented X 3. Psych:  Good affect, responds appropriately  Labs:   Lab Results  Component Value Date   WBC 12.7 (H) 04/26/2017   HGB 15.3 04/26/2017   HCT  45.2 04/26/2017   MCV 89.3 04/26/2017   PLT 275 04/26/2017    Recent Labs Lab 04/26/17 0920  NA 140  K 3.7  CL 109  CO2 20*  BUN 17  CREATININE 1.23  CALCIUM 9.5  PROT 7.4  BILITOT 2.4*  ALKPHOS 79  ALT 23  AST 31  GLUCOSE 149*   Lab Results  Component Value Date   TROPONINI <0.03 04/26/2017   No results found for: CHOL No results found for: HDL No results found for: LDLCALC No results found for: TRIG No results found for: CHOLHDL No results found for: LDLDIRECT    Radiology: Dg Chest Portable 1 View  Result Date: 04/26/2017 CLINICAL DATA:  Shortness of breath. EXAM: PORTABLE CHEST 1 VIEW COMPARISON:  None. FINDINGS: The cardiomediastinal silhouette is borderline enlarged. Mild pulmonary vascular congestion. Slightly coarsened interstitial markings. Bibasilar atelectasis. No definite consolidation. No pneumothorax or pleural effusion. No acute osseous abnormality. IMPRESSION: Mild pulmonary vascular congestion and interstitial edema. Electronically Signed   By: Obie Dredge M.D.   On: 04/26/2017 10:01    EKG: Atrial fibrillation  ASSESSMENT AND PLAN:   1. New-onset atrial fibrillation, hemoglobin and crit stable, chads Vasc of 4, rate improved on Cardizem drip 2. Congestive heart failure, likely exacerbated by rapid ventricular rate, with baseline LV ejection fraction of 45%, modestly improved after initial diuresis  Recommendations  1. Start Eliquis 5 mg twice a day. Discussed the risks, benefits alternatives of chronic anticoagulation, as well as, the advantages and disadvantages of warfarin versus a novel anticoagulants. The patient agrees to start Eliquis 5 mg twice a day. 2. Start Cardizem 60 mg every 6 hours 3. DC Cardizem drip 4. Continue metoprolol 5. Continue diuresis 6. Carefully monitor renal status 7. Review 2-D echocardiogram    Signed: Marcina Millard MD,PhD, [facility] 04/26/2017, 1:18 PM

## 2017-04-26 NOTE — Progress Notes (Signed)
Pt alert and oriented x4, no complaints of pain or discomfort.  Bed in low position, call bell within reach.  Bed alarms on and functioning.  Assessment done and charted.  Will continue to monitor and do hourly rounding throughout the shift 

## 2017-04-26 NOTE — H&P (Signed)
[facility] - [place] at [facility]   PATIENT NAME: Justin Mendez    MR#:  [id number]  DATE OF BIRTH:  1932-11-03  DATE OF ADMISSION:  04/26/2017  PRIMARY CARE PHYSICIAN: Danella Penton, MD   REQUESTING/REFERRING PHYSICIAN:   CHIEF COMPLAINT:   Chief Complaint  Patient presents with  . Shortness of Breath    HISTORY OF PRESENT ILLNESS:  Justin Mendez  is a 83 y.o. male with a known history of Hypertension, systolic CHF with ejection fraction 45% presents to the emergency room due to worsening shortness of breath, orthopnea and palpitations. Patient was seen by his primary care physician yesterday in the office and started on Cardizem 180 mg with new onset atrial fibrillation along with aspirin. Today patient has tachycardia in the 140s with acute hypoxic respiratory failure chest x-ray showing pulmonary edema and patient is being admitted for A. fib and CHF. No history of stroke.   PAST MEDICAL HISTORY:   Past Medical History:  Diagnosis Date  . H/O carotid endarterectomy   . Hearing loss   . High cholesterol   . Hypertension   . Neuropathy   . PAD (peripheral artery disease) (HCC)     PAST SURGICAL HISTORY:   Past Surgical History:  Procedure Laterality Date  . carotid endarderectomy     Bilateral  . KNEE SURGERY     Left  . NECK SURGERY      SOCIAL HISTORY:   Social History  Substance Use Topics  . Smoking status: Former Smoker    Types: Cigarettes    Quit date: 08/31/1984  . Smokeless tobacco: Never Used  . Alcohol use No    FAMILY HISTORY:   Family History  Problem Relation Age of Onset  . Lung cancer Father     DRUG ALLERGIES:   Allergies  Allergen Reactions  . Percocet [Oxycodone-Acetaminophen] Other (See Comments)    Confusion/hallucinations  . Iodinated Diagnostic Agents Other (See Comments)     Was very "flushed", denies itching, swelling or hives. He states this was over 30 years ago with Ionic contrast and not the  current non-ionic contrast. SPM    REVIEW OF SYSTEMS:   Review of Systems  Constitutional: Positive for malaise/fatigue. Negative for chills and fever.  HENT: Positive for hearing loss. Negative for sore throat.   Eyes: Negative for blurred vision, double vision and pain.  Respiratory: Positive for shortness of breath. Negative for cough, hemoptysis and wheezing.   Cardiovascular: Positive for chest pain, palpitations, orthopnea and leg swelling.  Gastrointestinal: Negative for abdominal pain, constipation, diarrhea, heartburn, nausea and vomiting.  Genitourinary: Negative for dysuria and hematuria.  Musculoskeletal: Negative for back pain and joint pain.  Skin: Negative for rash.  Neurological: Positive for weakness. Negative for sensory change, speech change, focal weakness and headaches.  Endo/Heme/Allergies: Does not bruise/bleed easily.  Psychiatric/Behavioral: Negative for depression. The patient is not nervous/anxious.     MEDICATIONS AT HOME:   Prior to Admission medications   Medication Sig Start Date End Date Taking? Authorizing Provider  aspirin EC 81 MG tablet Take 81 mg by mouth daily.   Yes [provider]  cilostazol (PLETAL) 100 MG tablet Take 100 mg by mouth 2 (two) times daily.   Yes [provider]  Cinnamon 500 MG capsule Take 500 mg by mouth daily.    Yes [provider]  Co-Enzyme Q-10 30 MG CAPS Take by mouth daily.    Yes [provider]  diltiazem (DILACOR XR)  180 MG 24 hr capsule Take 180 mg by mouth daily.   Yes [provider]  metoprolol succinate (TOPROL-XL) 50 MG 24 hr tablet Take 25 mg by mouth daily. Take with or immediately following a meal.   Yes [provider]  niacin (NIASPAN) 1000 MG CR tablet Take 1,000 mg by mouth daily.   Yes [provider]  HYDROcodone-acetaminophen (NORCO) 7.5-325 MG per tablet Take 1 tablet by mouth every 6 (six) hours as needed. Take 1 tablet by mouth every 4  to 6 hours as needed for pain    [provider]  traMADol (ULTRAM) 50 MG tablet Take by mouth every 8 (eight) hours as needed.    [provider]     VITAL SIGNS:  Blood pressure (!) 128/95, pulse (!) 130, temperature 97.7 F (36.5 C), temperature source Oral, resp. rate (!) 34, height 6' (1.829 m), weight 88.5 kg (195 lb).  PHYSICAL EXAMINATION:  Physical Exam  GENERAL:  83 y.o.-year-old patient lying in the bed with no acute distress.  EYES: Pupils equal, round, reactive to light and accommodation. No scleral icterus. Extraocular muscles intact.  HEENT: Head atraumatic, normocephalic. Oropharynx and nasopharynx clear. No oropharyngeal erythema, moist oral mucosa  NECK:  Supple, no jugular venous distention. No thyroid enlargement, no tenderness.  LUNGS: Decreased air entry bilaterally  CARDIOVASCULAR: S1, S2 irregular, tcahycardia. No murmurs, rubs, or gallops.  ABDOMEN: Soft, nontender, nondistended. Bowel sounds present. No organomegaly or mass.  EXTREMITIES: No pedal edema, cyanosis, or clubbing. + 2 pedal & radial pulses b/l.   NEUROLOGIC: Cranial nerves II through XII are intact. No focal Motor or sensory deficits appreciated b/l PSYCHIATRIC: The patient is alert and oriented x 3. Good affect.  SKIN: No obvious rash, lesion, or ulcer.   LABORATORY PANEL:   CBC  Recent Labs Lab 04/26/17 0920  WBC 12.7*  HGB 15.3  HCT 45.2  PLT 275   ------------------------------------------------------------------------------------------------------------------  Chemistries   Recent Labs Lab 04/26/17 0920  NA 140  K 3.7  CL 109  CO2 20*  GLUCOSE 149*  BUN 17  CREATININE 1.23  CALCIUM 9.5  MG 2.0  AST 31  ALT 23  ALKPHOS 79  BILITOT 2.4*   ------------------------------------------------------------------------------------------------------------------  Cardiac Enzymes  Recent Labs Lab 04/26/17 0920  TROPONINI <0.03    ------------------------------------------------------------------------------------------------------------------  RADIOLOGY:  Dg Chest Portable 1 View  Result Date: 04/26/2017 CLINICAL DATA:  Shortness of breath. EXAM: PORTABLE CHEST 1 VIEW COMPARISON:  None. FINDINGS: The cardiomediastinal silhouette is borderline enlarged. Mild pulmonary vascular congestion. Slightly coarsened interstitial markings. Bibasilar atelectasis. No definite consolidation. No pneumothorax or pleural effusion. No acute osseous abnormality. IMPRESSION: Mild pulmonary vascular congestion and interstitial edema. Electronically Signed   By: Obie Dredge M.D.   On: 04/26/2017 10:01     IMPRESSION AND PLAN:   * Afib with RVR Hold home cardizem Start cardizem drip. Added lopressor  Start heparin drip. Will need oral anticoagulation. Cardiology consulted.   * Acute on chronic systolic chf - IV Lasix, Beta blockers - Input and Output - Counseled to limit fluids and Salt - Monitor Bun/Cr and Potassium - Echo-ordered -Cardiology consult. Dr. Shawna Clamp   * Hypertension On cardizem drip and added lopressor  All the records are reviewed and case discussed with ED provider. Management plans discussed with the patient, family and they are in agreement.  CODE STATUS: FULL CODE  TOTAL CC TIME TAKING CARE OF THIS PATIENT: 40 minutes.   Milagros Loll R M.D on 04/26/2017  at 12:07 PM  Between 7am to 6pm - Pager - [phone]  After 6pm go to [email or web address] - password [email or web address]  [facility]  Office  [phone]  CC: Primary care physician; Danella Penton, MD  Note: This dictation was prepared with Dragon dictation along with smaller phrase technology. Any transcriptional errors that result from this process are unintentional.

## 2017-04-26 NOTE — ED Triage Notes (Signed)
Pt presented via [facility] from home. Pt is labored breathing, pt has a hx of AFIB and placed on new meds yesterday, diltziem 2 doses have been taken last night and today at 0700. Pt presented with 4L EMS acute, pt placed on 4L .

## 2017-04-26 NOTE — ED Provider Notes (Signed)
[facility] Emergency Department Provider Note  Time seen: 9:18 AM  I have reviewed the triage vital signs and the nursing notes.   HISTORY  Chief Complaint Shortness of Breath    HPI Justin Mendez is a 83 y.o. male With a past medical history of hypertension, hyperlipidemia, new onset atrial fibrillation diagnosed yesterday, presents to the emergency Department for shortness of breath and palpitations. According to the patient for the past several weeks she has been feeling shortness of breath especially with exertion along with heart palpitations/fluttering. Patient was seen by his doctor yesterday for the same and diagnosed with new onset atrial fibrillation. Patient takes 81 mg aspirin as his only anticoagulant, and was started on diltiazem which he took last night as well as this morning. Patient states this morning his shortness breath worsens significantly, he was having labored breathing so they called EMS. EMS states upon arrival patient appears to have an irregular heartbeat, with mild respiratory distress satting in the mid 80s on room air. No home O2 requirement. No history of CHF or COPD known to the patient. Patient denies any chest pain. Patient continues to state moderate shortness of breath, currently on several liters of oxygen with an oxygen saturation in the low to mid 90s.  Past Medical History:  Diagnosis Date  . High cholesterol   . Hypertension     There are no active problems to display for this patient.   Past Surgical History:  Procedure Laterality Date  . carotid endarderectomy     Bilateral  . KNEE SURGERY     Left  . NECK SURGERY      Prior to Admission medications   Medication Sig Start Date End Date Taking? Authorizing Provider  amLODipine-benazepril (LOTREL) 10-20 MG per capsule Take 1 capsule by mouth daily.    [provider]  aspirin EC 81 MG tablet Take 81 mg by mouth daily.    [provider]   atenolol (TENORMIN) 25 MG tablet Take 25 mg by mouth daily.    [provider]  Cinnamon 500 MG TABS Take 500 mg by mouth daily.    [provider]  dutasteride (AVODART) 0.5 MG capsule Take 0.5 mg by mouth daily.    [provider]  HYDROcodone-acetaminophen (NORCO) 7.5-325 MG per tablet Take 1 tablet by mouth every 6 (six) hours as needed. Take 1 tablet by mouth every 4 to 6 hours as needed for pain    [provider]  niacin (NIASPAN) 1000 MG CR tablet Take 1,000 mg by mouth at bedtime.    [provider]    Allergies  Allergen Reactions  . Percocet [Oxycodone-Acetaminophen] Other (See Comments)    Confusion/hallucinations  . Iodinated Diagnostic Agents Other (See Comments)     Was very "flushed", denies itching, swelling or hives. He states this was over 30 years ago with Ionic contrast and not the current non-ionic contrast. SPM    No family history on file.  Social History Social History  Substance Use Topics  . Smoking status: Former Smoker    Types: Cigarettes    Quit date: 08/31/1984  . Smokeless tobacco: Never Used  . Alcohol use No    Review of Systems Constitutional: Negative for fever. Cardiovascular: Negative for chest pain. positive for palpitations Respiratory: positive for shortness of breath. Negative for cough. Gastrointestinal: Negative for abdominal pain Musculoskeletal: negative for leg pain or swelling. All other ROS negative  ____________________________________________   PHYSICAL EXAM:  VITAL SIGNS:  ED Triage Vitals  Enc Vitals Group     BP --      Pulse Rate 04/26/17 0859 (!) 130     Resp --      Temp 04/26/17 0904 97.7 F (36.5 C)     Temp Source 04/26/17 0904 Oral     SpO2 --      Weight 04/26/17 0859 195 lb (88.5 kg)     Height 04/26/17 0859 6' (1.829 m)     Head Circumference --      Peak Flow --      Pain Score --      Pain Loc --      Pain Edu? --      Excl. in GC? --      Constitutional: Alert and oriented. Well appearing, sitting upright in bed, mild distress occasionally is to take a deep breath, appears short of breath. Eyes: Normal exam ENT   Head: Normocephalic and atraumatic.   Mouth/Throat: Mucous membranes are moist. Cardiovascular: irregular rhythm, rate around 130 bpm. Respiratory: mild tachypnea but breath sounds are clear. No rales or rhonchi. No wheeze. Gastrointestinal: Soft and nontender. No distention.   Musculoskeletal: Nontender with normal range of motion in all extremities. No lower extremity tenderness or edema. Neurologic:  Normal speech and language. No gross focal neurologic deficits Skin:  Skin is warm, dry and intact.  Psychiatric: Mood and affect are normal.  ____________________________________________    EKG  EKG reviewed and interpreted by myself shows atrial fibrillation at 124 bpm with a slightly widened QRS, normal axis, largely normal intervals besides slight QTC prolongation, nonspecific ST changes without elevation.  ____________________________________________    RADIOLOGY  interstitial edema on x-ray  ____________________________________________   INITIAL IMPRESSION / ASSESSMENT AND PLAN / ED COURSE  Pertinent labs & imaging results that were available during my care of the patient were reviewed by me and considered in my medical decision making (see chart for details).  the patient presents to the emergency department with shortness of breath and palpitations. On arrival patient is an 85% room air saturation, low 90s on several liters of oxygen. Patient was diagnosed with new onset atrial fibrillation yesterday started on diltiazem which she has taken 2 doses of including this morning. Differential at this time would include CHF, ACS, pulmonary edema, pulmonary embolus, pneumonia, pneumothorax. We will obtain a chest x-ray, labs and continue to closely monitor on telemetry. Given the patient's rapid  heartbeat and being symptomatic we will also treat with diltiazem in the emergency department.  patient's heart rate is decreased to currently around 100-110 bpm on diltiazem. We will continue to titrate as needed. Patient's lab work shows an elevated BNP of 304 but a normal troponin. Chest x-ray shows interstitial edema. Workup is most consistent with congestive heart failure. We will dose IV Lasix. We will discuss with cardiology and then admit to the hospital for further treatment/workup.  discussed with cardiology. Patient will be admitted to the hospitalist service.  ____________________________________________   FINAL CLINICAL IMPRESSION(S) / ED DIAGNOSES  Congestive Heart Failure new-onset atrial fibrillation with rapid ventricular response Hypoxia Dyspnea    Minna Antis, MD 04/26/17 1029

## 2017-04-26 NOTE — Progress Notes (Signed)
Pt complaining of shortness of breath and excessive secretions going down his throat.  Lasix and a PRN breathing treatment was given and pt states that he felt better after these treatments

## 2017-04-26 NOTE — Progress Notes (Signed)
Pt wife in the room with the pt.  No verbal complaints.  Pt assisted to the bathroom to void.  Tolerated the ambulation well.

## 2017-04-26 NOTE — Progress Notes (Signed)
Lab called with the troponins lab of 0.06  Dr Elpidio Anis called and made aware

## 2017-04-27 ENCOUNTER — Inpatient Hospital Stay
Admit: 2017-04-27 | Discharge: 2017-04-27 | Disposition: A | Discharge status: Home / Self Care | Payer: Medicare Other | Attending: Internal Medicine | Admitting: Internal Medicine

## 2017-04-27 LAB — BASIC METABOLIC PANEL
ANION GAP: 8 (ref 5–15)
BUN: 18 mg/dL (ref 6–20)
CHLORIDE: 110 mmol/L (ref 101–111)
CO2: 24 mmol/L (ref 22–32)
Calcium: 9.4 mg/dL (ref 8.9–10.3)
Creatinine, Ser: 1.24 mg/dL (ref 0.61–1.24)
GFR calc non Af Amer: 52 mL/min — ABNORMAL LOW (ref >60)
Glucose, Bld: 130 mg/dL — ABNORMAL HIGH (ref 65–99)
POTASSIUM: 3.4 mmol/L — AB (ref 3.5–5.1)
Sodium: 142 mmol/L (ref 135–145)

## 2017-04-27 LAB — ECHOCARDIOGRAM COMPLETE
Height: 72 in
Weight: 3032 oz

## 2017-04-27 LAB — CBC
HEMATOCRIT: 43 % (ref 40.0–52.0)
HEMOGLOBIN: 14.8 g/dL (ref 13.0–18.0)
MCH: 31 pg (ref 26.0–34.0)
MCHC: 34.4 g/dL (ref 32.0–36.0)
MCV: 89.9 fL (ref 80.0–100.0)
Platelets: 255 10*3/uL (ref 150–440)
RBC: 4.78 MIL/uL (ref 4.40–5.90)
RDW: 14.6 % — ABNORMAL HIGH (ref 11.5–14.5)
WBC: 10 10*3/uL (ref 3.8–10.6)

## 2017-04-27 LAB — HEPARIN LEVEL (UNFRACTIONATED)

## 2017-04-27 LAB — MAGNESIUM: MAGNESIUM: 2.2 mg/dL (ref 1.7–2.4)

## 2017-04-27 MED ORDER — SODIUM CHLORIDE 0.9% FLUSH
3.0000 mL | Freq: Two times a day (BID) | INTRAVENOUS | Status: DC
  Administered 2017-04-27 – 2017-04-28 (×3): 3 mL via INTRAVENOUS

## 2017-04-27 MED ORDER — HEPARIN (PORCINE) IN NACL 100-0.45 UNIT/ML-% IJ SOLN
15.0000 [IU]/kg/h | INTRAMUSCULAR | Status: DC
  Administered 2017-04-27 – 2017-04-28 (×2): 15 [IU]/kg/h via INTRAVENOUS
  Filled 2017-04-27 (×3): qty 250

## 2017-04-27 MED ORDER — HEPARIN (PORCINE) IN NACL 100-0.45 UNIT/ML-% IJ SOLN: 10.0000 [IU]/kg/h | INTRAMUSCULAR | Status: DC

## 2017-04-27 MED ORDER — SODIUM CHLORIDE 0.9 % IV SOLN: 250.0000 mL | INTRAVENOUS | Status: DC | PRN

## 2017-04-27 MED ORDER — DILTIAZEM HCL ER COATED BEADS 120 MG PO CP24
240.0000 mg | ORAL_CAPSULE | Freq: Every day | ORAL | Status: DC
  Administered 2017-04-27 – 2017-04-29 (×3): 240 mg via ORAL
  Filled 2017-04-27 (×2): qty 2

## 2017-04-27 MED ORDER — ASPIRIN 81 MG PO CHEW: 81.0000 mg | CHEWABLE_TABLET | ORAL | Status: AC

## 2017-04-27 MED ORDER — SODIUM CHLORIDE 0.9 % WEIGHT BASED INFUSION: 1.0000 mL/kg/h | INTRAVENOUS | Status: DC

## 2017-04-27 MED ORDER — GUAIFENESIN-DM 100-10 MG/5ML PO SYRP
5.0000 mL | ORAL_SOLUTION | ORAL | Status: DC | PRN
  Administered 2017-04-27: 5 mL via ORAL
  Filled 2017-04-27: qty 5

## 2017-04-27 MED ORDER — SODIUM CHLORIDE 0.9% FLUSH
3.0000 mL | INTRAVENOUS | Status: DC | PRN
  Administered 2017-04-27: 3 mL via INTRAVENOUS
  Filled 2017-04-27: qty 3

## 2017-04-27 MED ORDER — POTASSIUM CHLORIDE CRYS ER 20 MEQ PO TBCR
40.0000 meq | EXTENDED_RELEASE_TABLET | Freq: Once | ORAL | Status: AC
  Administered 2017-04-27: 40 meq via ORAL
  Filled 2017-04-27: qty 2

## 2017-04-27 MED ORDER — SODIUM CHLORIDE 0.9 % WEIGHT BASED INFUSION: 3.0000 mL/kg/h | INTRAVENOUS | Status: AC

## 2017-04-27 MED ORDER — SODIUM CHLORIDE 0.9 % WEIGHT BASED INFUSION: 3.0000 mL/kg/h | INTRAVENOUS | Status: DC

## 2017-04-27 NOTE — Progress Notes (Signed)
ANTICOAGULATION CONSULT NOTE - Initial Consult  Pharmacy Consult for heparin gtt after apixaban Indication: atrial fibrillation  Allergies  Allergen Reactions  . Percocet [Oxycodone-Acetaminophen] Other (See Comments)    Confusion/hallucinations  . Iodinated Diagnostic Agents Other (See Comments)     Was very "flushed", denies itching, swelling or hives. He states this was over 30 years ago with Ionic contrast and not the current non-ionic contrast. SPM    Patient Measurements: Height: 6' (182.9 cm) Weight: 189 lb 8 oz (86 kg) IBW/kg (Calculated) : 77.6   Vital Signs: Temp: 97.6 F (36.4 C) (10/13 1223) Temp Source: Oral (10/13 1223) BP: 152/103 (10/13 1223) Pulse Rate: 77 (10/13 1223)  Labs:  Recent Labs  04/26/17 0920 04/26/17 1238 04/26/17 1520 04/26/17 2045 04/27/17 0638  HGB 15.3  --   --   --  14.8  HCT 45.2  --   --   --  43.0  PLT 275  --   --   --  255  APTT  --  28  --   --   --   LABPROT  --  14.8  --   --   --   INR  --  1.17  --   --   --   CREATININE 1.23  --   --   --  1.24  TROPONINI <0.03  --  0.06* 0.08*  --     Estimated Creatinine Clearance: 50.4 mL/min (by C-G formula based on SCr of 1.24 mg/dL).   Medical History: Past Medical History:  Diagnosis Date  . H/O carotid endarterectomy   . Hearing loss   . High cholesterol   . Hypertension   . Neuropathy   . PAD (peripheral artery disease) (HCC)     Medications:  Prescriptions Prior to Admission  Medication Sig Dispense Refill Last Dose  . aspirin EC 81 MG tablet Take 81 mg by mouth daily.   04/25/2017 at 0800  . cilostazol (PLETAL) 100 MG tablet Take 100 mg by mouth 2 (two) times daily.   04/25/2017 at 0800  . Cinnamon 500 MG capsule Take 500 mg by mouth daily.    04/25/2017 at 0800  . Co-Enzyme Q-10 30 MG CAPS Take by mouth daily.    04/25/2017 at 0800  . diltiazem (DILACOR XR) 180 MG 24 hr capsule Take 180 mg by mouth daily.   04/25/2017 at 0800  . metoprolol succinate (TOPROL-XL)  50 MG 24 hr tablet Take 25 mg by mouth daily. Take with or immediately following a meal.   04/25/2017 at 0800  . niacin (NIASPAN) 1000 MG CR tablet Take 1,000 mg by mouth daily.   04/25/2017 at 0800  . HYDROcodone-acetaminophen (NORCO) 7.5-325 MG per tablet Take 1 tablet by mouth every 6 (six) hours as needed. Take 1 tablet by mouth every 4 to 6 hours as needed for pain   Not Taking at Unknown time  . traMADol (ULTRAM) 50 MG tablet Take by mouth every 8 (eight) hours as needed.   prn at prn   Scheduled:  . diltiazem  240 mg Oral Daily  . furosemide  60 mg Intravenous BID  . heparin  4,400 Units Intravenous Once  . metoprolol tartrate  25 mg Oral BID  . sodium chloride flush  3 mL Intravenous Q12H   Infusions:  . heparin     PRN: acetaminophen **OR** acetaminophen, albuterol, guaiFENesin-dextromethorphan, ondansetron **OR** ondansetron (ZOFRAN) IV, polyethylene glycol Anti-infectives    None      Assessment: Patient  had am dose of apixaban at 10am, will start heparin gtt at 2200 per protocol. No bolus, Dr Jeri Lager is going to do cath Monday AM he told me via phone. Target 0.3-0.7 anti Xa level, but will need to adjust via aPTT until levels correlate.    Goal of Therapy:  Heparin level 0.3-0.7 units/ml Monitor platelets by anticoagulation protocol: Yes   Plan:  Start heparin infusion at 1300 units/hr Check anti-Xa level in 8 hours and daily while on heparin Continue to monitor H&H and platelets  Gerre Pebbles Jaquae Rieves 04/27/2017,3:56 PM

## 2017-04-27 NOTE — Progress Notes (Signed)
[facility] Cardiology  SUBJECTIVE: Patient doing well, with improved breathing, denies chest pain or palpitations   Vitals:   04/26/17 1932 04/27/17 0421 04/27/17 0500 04/27/17 1016  BP: 137/60 116/65  (!) 154/61  Pulse: 82 66  83  Resp:    20  Temp: 97.8 F (36.6 C) 97.7 F (36.5 C)  98.5 F (36.9 C)  TempSrc: Oral Oral  Oral  SpO2: 93% 92%  93%  Weight:   86 kg (189 lb 8 oz)   Height:         Intake/Output Summary (Last 24 hours) at 04/27/17 1108 Last data filed at 04/27/17 1025  Gross per 24 hour  Intake             12.5 ml  Output             2125 ml  Net          -2112.5 ml      PHYSICAL EXAM  General: Well developed, well nourished, in no acute distress HEENT:  Normocephalic and atramatic Neck:  No JVD.  Lungs: Clear bilaterally to auscultation and percussion. Heart: Irregularly irregular rhythm . Normal S1 and S2 without gallops or murmurs.  Abdomen: Bowel sounds are positive, abdomen soft and non-tender  Msk:  Back normal, normal gait. Normal strength and tone for age. Extremities: No clubbing, cyanosis or edema.   Neuro: Alert and oriented X 3. Psych:  Good affect, responds appropriately   LABS: Basic Metabolic Panel:  Recent Labs  86/57/84 0920 04/27/17 0638  NA 140 142  K 3.7 3.4*  CL 109 110  CO2 20* 24  GLUCOSE 149* 130*  BUN 17 18  CREATININE 1.23 1.24  CALCIUM 9.5 9.4  MG 2.0 2.2   Liver Function Tests:  Recent Labs  04/26/17 0920  AST 31  ALT 23  ALKPHOS 79  BILITOT 2.4*  PROT 7.4  ALBUMIN 4.5   No results for input(s): LIPASE, AMYLASE in the last 72 hours. CBC:  Recent Labs  04/26/17 0920 04/27/17 0638  WBC 12.7* 10.0  HGB 15.3 14.8  HCT 45.2 43.0  MCV 89.3 89.9  PLT 275 255   Cardiac Enzymes:  Recent Labs  04/26/17 0920 04/26/17 1520 04/26/17 2045  TROPONINI <0.03 0.06* 0.08*   BNP: Invalid input(s): POCBNP D-Dimer: No results for input(s): DDIMER in the last 72 hours. Hemoglobin A1C: No results for  input(s): HGBA1C in the last 72 hours. Fasting Lipid Panel: No results for input(s): CHOL, HDL, LDLCALC, TRIG, CHOLHDL, LDLDIRECT in the last 72 hours. Thyroid Function Tests:  Recent Labs  04/26/17 0920  TSH 1.882   Anemia Panel: No results for input(s): VITAMINB12, FOLATE, FERRITIN, TIBC, IRON, RETICCTPCT in the last 72 hours.  Dg Chest Portable 1 View  Result Date: 04/26/2017 CLINICAL DATA:  Shortness of breath. EXAM: PORTABLE CHEST 1 VIEW COMPARISON:  None. FINDINGS: The cardiomediastinal silhouette is borderline enlarged. Mild pulmonary vascular congestion. Slightly coarsened interstitial markings. Bibasilar atelectasis. No definite consolidation. No pneumothorax or pleural effusion. No acute osseous abnormality. IMPRESSION: Mild pulmonary vascular congestion and interstitial edema. Electronically Signed   By: Obie Dredge M.D.   On: 04/26/2017 10:01     Echo pending  TELEMETRY: Atrial fibrillation 80-90 bpm:  ASSESSMENT AND PLAN:  Active Problems:   A-fib (HCC)    1. New onset atrial fibrillation, chads Vascor 4, on Eliquis for stroke prevention, on metoprolol and Cardizem for rate control 2. Congestive heart failure, with known baseline LV ejection fraction of  45%, likely due to atrial fibrillation with a rapid ventricular rate, improving after diuresis 3. Mild aortic stenosis 4. Borderline elevated troponin, likely demand supply ischemia  Recommendations  1. Continue Eliquis for stroke prevention 2. Continue metoprolol and Cardizem CD for rate control 3. Continue diuresis 4. Closely monitor renal status 5. Review 2-D echocardiogram 6. We'll likely proceed with ETT Myoview as outpatient   Marcina Millard, MD, PhD, [facility] 04/27/2017 11:08 AM

## 2017-04-27 NOTE — Progress Notes (Addendum)
[employer] - [place] at [facility]   PATIENT NAME: Justin Mendez    MR#:  [id number]  DATE OF BIRTH:  [birth year]  SUBJECTIVE:  CHIEF COMPLAINT:   Chief Complaint  Patient presents with  . Shortness of Breath   The patient still has shortness breath and cough, on O2 [place] 4L. REVIEW OF SYSTEMS:  Review of Systems  Constitutional: Negative for chills, fever and malaise/fatigue.  HENT: Negative for sore throat.   Eyes: Negative for blurred vision and double vision.  Respiratory: Positive for cough, sputum production and shortness of breath. Negative for hemoptysis, wheezing and stridor.   Cardiovascular: Negative for chest pain, palpitations, orthopnea and leg swelling.  Gastrointestinal: Negative for abdominal pain, blood in stool, diarrhea, melena, nausea and vomiting.  Genitourinary: Negative for dysuria, flank pain and hematuria.  Musculoskeletal: Negative for back pain and joint pain.  Skin: Negative for rash.  Neurological: Negative for dizziness, sensory change, focal weakness, seizures, loss of consciousness, weakness and headaches.  Endo/Heme/Allergies: Negative for polydipsia.  Psychiatric/Behavioral: Negative for depression. The patient is not nervous/anxious.     DRUG ALLERGIES:   Allergies  Allergen Reactions  . Percocet [Oxycodone-Acetaminophen] Other (See Comments)    Confusion/hallucinations  . Iodinated Diagnostic Agents Other (See Comments)     Was very "flushed", denies itching, swelling or hives. He states this was over 30 years ago with Ionic contrast and not the current non-ionic contrast. SPM   VITALS:  Blood pressure (!) 152/103, pulse 77, temperature 97.6 F (36.4 C), temperature source Oral, resp. rate 16, height 6' (1.829 m), weight 189 lb 8 oz (86 kg), SpO2 93 %. PHYSICAL EXAMINATION:  Physical Exam  Constitutional: He is oriented to person, place, and time and well-developed, well-nourished, and in no distress.  HENT:  Head:  Normocephalic.  Mouth/Throat: Oropharynx is clear and moist.  Eyes: Pupils are equal, round, and reactive to light. Conjunctivae and EOM are normal. No scleral icterus.  Neck: Normal range of motion. Neck supple. No JVD present. No tracheal deviation present.  Cardiovascular: Normal rate and normal heart sounds.  Exam reveals no gallop.   No murmur heard. Irregular rate and rhythm  Pulmonary/Chest: Effort normal. No respiratory distress. He has no wheezes. He has rales.  Abdominal: Soft. Bowel sounds are normal. He exhibits no distension. There is no tenderness. There is no rebound.  Musculoskeletal: Normal range of motion. He exhibits no edema or tenderness.  Neurological: He is alert and oriented to person, place, and time. No cranial nerve deficit.  Skin: No rash noted. No erythema.  Psychiatric: Affect normal.   LABORATORY PANEL:  Male CBC  Recent Labs Lab 04/27/17 0638  WBC 10.0  HGB 14.8  HCT 43.0  PLT 255   ------------------------------------------------------------------------------------------------------------------ Chemistries   Recent Labs Lab 04/26/17 0920 04/27/17 0638  NA 140 142  K 3.7 3.4*  CL 109 110  CO2 20* 24  GLUCOSE 149* 130*  BUN 17 18  CREATININE 1.23 1.24  CALCIUM 9.5 9.4  MG 2.0 2.2  AST 31  --   ALT 23  --   ALKPHOS 79  --   BILITOT 2.4*  --    RADIOLOGY:  No results found. ASSESSMENT AND PLAN:   * Afib with RVR He was on cardizem drip. Added lopressor  Started heparin drip and Changed to Eliquis. Per Dr. Darrold Junker, Continue metoprolol and Cardizem CD for rate control. Continue Eliquis for stroke prevention. likely proceed with ETT Myoview as outpatient. I explained about  the benefits and side effects of anticoagulation to the patient.  * Acute on chronic systolic CHF, LV EF: 30% -   35% Continue IV Lasix, Beta blockers, ACEI per Dr. Darrold Junker.  * Acute respiratory failure with hypoxia due to above. Try to wean off oxygen,  CPAP when necessary. Continue Lasix.  * Hypertension Continue metoprolol and Cardizem CD lopressor  Elevated troponin. Due to demanding ischemia. Elevated bilirubin. Unclear etiology. Abdominal ultrasound. Hypokalemia. Give potassium supplement Magnesium is normal.  All the records are reviewed and case discussed with Care Management/Social Worker. Management plans discussed with the patient, family and they are in agreement.  CODE STATUS: Full Code  TOTAL TIME TAKING CARE OF THIS PATIENT: 37 minutes.   More than 50% of the time was spent in counseling/coordination of care: YES  POSSIBLE D/C IN 2 DAYS, DEPENDING ON CLINICAL CONDITION.   Shaune Pollack M.D on 04/27/2017 at 3:30 PM  Between 7am to 6pm - Pager - [phone]  After 6pm go to [email or web address] - Therapist, nutritional Hospitalists

## 2017-04-28 ENCOUNTER — Inpatient Hospital Stay: Payer: Medicare Other

## 2017-04-28 LAB — CBC
HCT: 49.4 % (ref 40.0–52.0)
Hemoglobin: 17.1 g/dL (ref 13.0–18.0)
MCH: 31 pg (ref 26.0–34.0)
MCHC: 34.7 g/dL (ref 32.0–36.0)
MCV: 89.3 fL (ref 80.0–100.0)
PLATELETS: 328 10*3/uL (ref 150–440)
RBC: 5.53 MIL/uL (ref 4.40–5.90)
RDW: 14.7 % — ABNORMAL HIGH (ref 11.5–14.5)
WBC: 12.3 10*3/uL — AB (ref 3.8–10.6)

## 2017-04-28 LAB — BASIC METABOLIC PANEL
Anion gap: 13 (ref 5–15)
BUN: 16 mg/dL (ref 6–20)
CALCIUM: 9.4 mg/dL (ref 8.9–10.3)
CO2: 24 mmol/L (ref 22–32)
CREATININE: 1.06 mg/dL (ref 0.61–1.24)
Chloride: 102 mmol/L (ref 101–111)
GFR calc Af Amer: >60 mL/min (ref >60)
GLUCOSE: 112 mg/dL — AB (ref 65–99)
Potassium: 3.4 mmol/L — ABNORMAL LOW (ref 3.5–5.1)
Sodium: 139 mmol/L (ref 135–145)

## 2017-04-28 LAB — BILIRUBIN, TOTAL: Total Bilirubin: 2.1 mg/dL — ABNORMAL HIGH (ref 0.3–1.2)

## 2017-04-28 LAB — HEPARIN LEVEL (UNFRACTIONATED)
HEPARIN UNFRACTIONATED: 3.46 [IU]/mL — AB (ref 0.30–0.70)
Heparin Unfractionated: 3.02 IU/mL — ABNORMAL HIGH (ref 0.30–0.70)

## 2017-04-28 LAB — APTT
APTT: 68 s — AB (ref 24–36)
aPTT: 74 seconds — ABNORMAL HIGH (ref 24–36)

## 2017-04-28 MED ORDER — POTASSIUM CHLORIDE CRYS ER 20 MEQ PO TBCR
20.0000 meq | EXTENDED_RELEASE_TABLET | Freq: Every day | ORAL | Status: DC
Start: 2017-04-28 — End: 2017-04-29
  Administered 2017-04-28: 20 meq via ORAL
  Filled 2017-04-28: qty 1

## 2017-04-28 NOTE — Progress Notes (Signed)
[facility] Cardiology  SUBJECTIVE: patient doing well, denies chest pain, reports improved breathing   Vitals:   04/27/17 1756 04/27/17 1951 04/28/17 0354 04/28/17 0858  BP: (!) 119/59 136/61 111/82 135/76  Pulse:  (!) 37 67 (!) 46  Resp:  16 18   Temp:  97.8 F (36.6 C) 98 F (36.7 C) 97.8 F (36.6 C)  TempSrc:   Oral Oral  SpO2: 92% 93% 92% 92%  Weight:   83.9 kg (184 lb 14.4 oz)   Height:         Intake/Output Summary (Last 24 hours) at 04/28/17 1039 Last data filed at 04/28/17 1028  Gross per 24 hour  Intake              360 ml  Output             2300 ml  Net            -1940 ml      PHYSICAL EXAM  General: Well developed, well nourished, in no acute distress HEENT:  Normocephalic and atramatic Neck:  No JVD.  Lungs: Clear bilaterally to auscultation and percussion. Heart: HRRR . Grade 2/6 systolic murmur Abdomen: Bowel sounds are positive, abdomen soft and non-tender  Msk:  Back normal, normal gait. Normal strength and tone for age. Extremities: No clubbing, cyanosis or edema.   Neuro: Alert and oriented X 3. Psych:  Good affect, responds appropriately   LABS: Basic Metabolic Panel:  Recent Labs  40/98/11 0920 04/27/17 0638 04/28/17 0308  NA 140 142 139  K 3.7 3.4* 3.4*  CL 109 110 102  CO2 20* 24 24  GLUCOSE 149* 130* 112*  BUN CREATININE 1.23 1.24 1.06  CALCIUM 9.5 9.4 9.4  MG 2.0 2.2  --    Liver Function Tests:  Recent Labs  04/26/17 0920 04/28/17 0308  AST 31  --   ALT 23  --   ALKPHOS 79  --   BILITOT 2.4* 2.1*  PROT 7.4  --   ALBUMIN 4.5  --    No results for input(s): LIPASE, AMYLASE in the last 72 hours. CBC:  Recent Labs  04/26/17 0920 04/27/17 0638  WBC 12.7* 10.0  HGB 15.3 14.8  HCT 45.2 43.0  MCV 89.3 89.9  PLT 275 255   Cardiac Enzymes:  Recent Labs  04/26/17 0920 04/26/17 1520 04/26/17 2045  TROPONINI <0.03 0.06* 0.08*   BNP: Invalid input(s): POCBNP D-Dimer: No results for input(s): DDIMER in  the last 72 hours. Hemoglobin A1C: No results for input(s): HGBA1C in the last 72 hours. Fasting Lipid Panel: No results for input(s): CHOL, HDL, LDLCALC, TRIG, CHOLHDL, LDLDIRECT in the last 72 hours. Thyroid Function Tests:  Recent Labs  04/26/17 0920  TSH 1.882   Anemia Panel: No results for input(s): VITAMINB12, FOLATE, FERRITIN, TIBC, IRON, RETICCTPCT in the last 72 hours.  US Abdomen Limited Ruq  Result Date: 04/28/2017 CLINICAL DATA:  Elevated bilirubin level. EXAM: ULTRASOUND ABDOMEN LIMITED RIGHT UPPER QUADRANT COMPARISON:  None. FINDINGS: Gallbladder: 1.9 cm gallstone is noted in neck of gallbladder. 3 mm polyp is noted. No gallbladder wall thickening or pericholecystic fluid is noted. No sonographic Murphy's sign is noted. Common bile duct: Diameter: 3 mm which is within normal limits. Liver: No focal lesion identified. Within normal limits in parenchymal echogenicity. Portal vein is patent on color Doppler imaging with normal direction of blood flow towards the liver. IMPRESSION: Solitary gallstone seen. Small gallbladder polyp. No evidence of  cholecystitis. No other abnormality seen in the right upper quadrant of the abdomen. Electronically Signed   By: Lupita Raider, M.D.   On: 04/28/2017 10:16     Echo  LVEF 30-35% mild-mod AS  TELEMETRY: atrial fibrillation 80 bpm:  ASSESSMENT AND PLAN:  Active Problems:   A-fib (HCC)    1. New-onset atrial fibrillation, chads Vasc 4,rate control and metoprolol and Cardizem, on heparin 2. Acute systolic congestive heart failure, likely exacerbated by atrial fibrillation, with LVEF of 30-35%, with evidence of regional wall abnormalities suggestive of underlying coronary artery disease,with borderline elevated troponin 3.  Mild to moderate aortic stenosis  Recommendations  1. Continue current therapy 2. Continue diuresis 3. Carefully monitor renal status 4. Proceed with right and left heart cardiac catheterization on  10/15/018. The risks, benefits and alternatives to cardiac catheterization and possible PCI were explained to the patient a informed written consentwas obtained.  Marcina Millard, MD, PhD, [facility] 04/28/2017 10:39 AM

## 2017-04-28 NOTE — Progress Notes (Signed)
[employer] - [place] at [facility]   PATIENT NAME: Justin Mendez    MR#:  [id number]  DATE OF BIRTH:  [birth year]  SUBJECTIVE:  CHIEF COMPLAINT:   Chief Complaint  Patient presents with  . Shortness of Breath   Better shortness breath and cough, on O2 [place] 2 L. REVIEW OF SYSTEMS:  Review of Systems  Constitutional: Negative for chills, fever and malaise/fatigue.  HENT: Negative for sore throat.   Eyes: Negative for blurred vision and double vision.  Respiratory: Positive for cough and shortness of breath. Negative for hemoptysis, sputum production, wheezing and stridor.   Cardiovascular: Negative for chest pain, palpitations, orthopnea and leg swelling.  Gastrointestinal: Negative for abdominal pain, blood in stool, diarrhea, melena, nausea and vomiting.  Genitourinary: Negative for dysuria, flank pain and hematuria.  Musculoskeletal: Negative for back pain and joint pain.  Skin: Negative for rash.  Neurological: Negative for dizziness, sensory change, focal weakness, seizures, loss of consciousness, weakness and headaches.  Endo/Heme/Allergies: Negative for polydipsia.  Psychiatric/Behavioral: Negative for depression. The patient is not nervous/anxious.     DRUG ALLERGIES:   Allergies  Allergen Reactions  . Percocet [Oxycodone-Acetaminophen] Other (See Comments)    Confusion/hallucinations  . Iodinated Diagnostic Agents Other (See Comments)     Was very "flushed", denies itching, swelling or hives. He states this was over 30 years ago with Ionic contrast and not the current non-ionic contrast. SPM   VITALS:  Blood pressure 135/76, pulse (!) 108, temperature 97.8 F (36.6 C), temperature source Oral, resp. rate 18, height 6' (1.829 m), weight 184 lb 14.4 oz (83.9 kg), SpO2 92 %. PHYSICAL EXAMINATION:  Physical Exam  Constitutional: He is oriented to person, place, and time and well-developed, well-nourished, and in no distress.  HENT:  Head:  Normocephalic.  Mouth/Throat: Oropharynx is clear and moist.  Eyes: Pupils are equal, round, and reactive to light. Conjunctivae and EOM are normal. No scleral icterus.  Neck: Normal range of motion. Neck supple. No JVD present. No tracheal deviation present.  Cardiovascular: Normal rate and normal heart sounds.  Exam reveals no gallop.   No murmur heard. Irregular rate and rhythm  Pulmonary/Chest: Effort normal. No respiratory distress. He has no wheezes. He has no rales.  Abdominal: Soft. Bowel sounds are normal. He exhibits no distension. There is no tenderness. There is no rebound.  Musculoskeletal: Normal range of motion. He exhibits no edema or tenderness.  Neurological: He is alert and oriented to person, place, and time. No cranial nerve deficit.  Skin: No rash noted. No erythema.  Psychiatric: Affect normal.   LABORATORY PANEL:  Male CBC  Recent Labs Lab 04/27/17 0638  WBC 10.0  HGB 14.8  HCT 43.0  PLT 255   ------------------------------------------------------------------------------------------------------------------ Chemistries   Recent Labs Lab 04/26/17 0920 04/27/17 0638 04/28/17 0308  NA 140 142 139  K 3.7 3.4* 3.4*  CL 109 110 102  CO2 20* 24 24  GLUCOSE 149* 130* 112*  BUN CREATININE 1.23 1.24 1.06  CALCIUM 9.5 9.4 9.4  MG 2.0 2.2  --   AST 31  --   --   ALT 23  --   --   ALKPHOS 79  --   --   BILITOT 2.4*  --  2.1*   RADIOLOGY:  US Abdomen Limited Ruq  Result Date: 04/28/2017 CLINICAL DATA:  Elevated bilirubin level. EXAM: ULTRASOUND ABDOMEN LIMITED RIGHT UPPER QUADRANT COMPARISON:  None. FINDINGS: Gallbladder: 1.9 cm gallstone is noted  in neck of gallbladder. 3 mm polyp is noted. No gallbladder wall thickening or pericholecystic fluid is noted. No sonographic Murphy's sign is noted. Common bile duct: Diameter: 3 mm which is within normal limits. Liver: No focal lesion identified. Within normal limits in parenchymal echogenicity.  Portal vein is patent on color Doppler imaging with normal direction of blood flow towards the liver. IMPRESSION: Solitary gallstone seen. Small gallbladder polyp. No evidence of cholecystitis. No other abnormality seen in the right upper quadrant of the abdomen. Electronically Signed   By: Lupita Raider, M.D.   On: 04/28/2017 10:16   ASSESSMENT AND PLAN:   * Afib with RVR He was on cardizem drip. Added lopressor  Started heparin drip. Per Dr. Darrold Junker, Continue metoprolol and Cardizem CD for rate control. Continue Heparin drip and cardiac catheter tomorrow.  * Acute on chronic systolic CHF, LV EF: 30% -   35% Continue IV Lasix, Beta blockers. Defer ACEI to Dr. Darrold Junker.  * Acute respiratory failure with hypoxia due to above. Try to wean off oxygen, CPAP when necessary. Continue Lasix.  * Hypertension Continue metoprolol and Cardizem CD lopressor  Elevated troponin. Due to demanding ischemia. Elevated bilirubin. Unclear etiology. Abdominal ultrasound: Cholelithiasis and gallbladder polyp. Hypokalemia. Give potassium supplement and follow-up level Magnesium is normal.  All the records are reviewed and case discussed with Care Management/Social Worker. Management plans discussed with the patient, his Daughter, daughter-in-law and wife and they are in agreement.  CODE STATUS: Full Code  TOTAL TIME TAKING CARE OF THIS PATIENT: 42 minutes.   More than 50% of the time was spent in counseling/coordination of care: YES  POSSIBLE D/C IN 2 DAYS, DEPENDING ON CLINICAL CONDITION.   Shaune Pollack M.D on 04/28/2017 at 2:08 PM  Between 7am to 6pm - Pager - [phone]  After 6pm go to [email or web address] - Therapist, nutritional Hospitalists

## 2017-04-28 NOTE — Progress Notes (Signed)
ANTICOAGULATION CONSULT NOTE - Initial Consult  Pharmacy Consult for heparin gtt after apixaban Indication: atrial fibrillation  Allergies  Allergen Reactions  . Percocet [Oxycodone-Acetaminophen] Other (See Comments)    Confusion/hallucinations  . Iodinated Diagnostic Agents Other (See Comments)     Was very "flushed", denies itching, swelling or hives. He states this was over 30 years ago with Ionic contrast and not the current non-ionic contrast. SPM    Patient Measurements: Height: 6' (182.9 cm) Weight: 184 lb 14.4 oz (83.9 kg) IBW/kg (Calculated) : 77.6   Vital Signs: Temp: 97.8 F (36.6 C) (10/14 0858) Temp Source: Oral (10/14 0858) BP: 135/76 (10/14 0858) Pulse Rate: 108 (10/14 1132)  Labs:  Recent Labs  04/26/17 0920 04/26/17 1238 04/26/17 1520 04/26/17 2045 04/27/17 0638 04/27/17 1641 04/28/17 0308 04/28/17 1100  HGB 15.3  --   --   --  14.8  --   --   --   HCT 45.2  --   --   --  43.0  --   --   --   PLT 275  --   --   --  255  --   --   --   APTT  --  28  --   --   --   --  68* 74*  LABPROT  --  14.8  --   --   --   --   --   --   INR  --  1.17  --   --   --   --   --   --   HEPARINUNFRC  --   --   --   --   --  >3.60* 3.46* 3.02*  CREATININE 1.23  --   --   --  1.24  --  1.06  --   TROPONINI <0.03  --  0.06* 0.08*  --   --   --   --     Estimated Creatinine Clearance: 59 mL/min (by C-G formula based on SCr of 1.06 mg/dL).   Medical History: Past Medical History:  Diagnosis Date  . H/O carotid endarterectomy   . Hearing loss   . High cholesterol   . Hypertension   . Neuropathy   . PAD (peripheral artery disease) (HCC)     Assessment: Patient had am dose of apixaban at 10am, will start heparin gtt at 2200 per protocol. No bolus, Dr Jeri Lager is going to do cath Monday AM he told me via phone.  Target 0.3-0.7 anti Xa level, but will need to adjust via aPTT until levels correlate.   Goal of Therapy:  APTT 66 - 109 sec Heparin level 0.3-0.7  units/ml Monitor platelets by anticoagulation protocol: Yes   Plan:  Current orders for heparin 15units/kg/hr. APTT therapeutic x 2. Continue current rate and recheck APTT, HL, and CBC with AM labs.  Garlon Hatchet, PharmD, BCPS Clinical Pharmacist  04/28/2017 2:16 PM

## 2017-04-28 NOTE — Progress Notes (Signed)
Patient NPO since midnight for [place] of abd today.

## 2017-04-28 NOTE — Progress Notes (Signed)
ANTICOAGULATION CONSULT NOTE - Initial Consult  Pharmacy Consult for heparin gtt after apixaban Indication: atrial fibrillation  Allergies  Allergen Reactions  . Percocet [Oxycodone-Acetaminophen] Other (See Comments)    Confusion/hallucinations  . Iodinated Diagnostic Agents Other (See Comments)     Was very "flushed", denies itching, swelling or hives. He states this was over 30 years ago with Ionic contrast and not the current non-ionic contrast. SPM    Patient Measurements: Height: 6' (182.9 cm) Weight: 189 lb 8 oz (86 kg) IBW/kg (Calculated) : 77.6   Vital Signs: Temp: 98 F (36.7 C) (10/14 0354) Temp Source: Oral (10/14 0354) BP: 111/82 (10/14 0354) Pulse Rate: 67 (10/14 0354)  Labs:  Recent Labs  04/26/17 0920 04/26/17 1238 04/26/17 1520 04/26/17 2045 04/27/17 0638 04/27/17 1641 04/28/17 0308  HGB 15.3  --   --   --  14.8  --   --   HCT 45.2  --   --   --  43.0  --   --   PLT 275  --   --   --  255  --   --   APTT  --  28  --   --   --   --  68*  LABPROT  --  14.8  --   --   --   --   --   INR  --  1.17  --   --   --   --   --   HEPARINUNFRC  --   --   --   --   --  >3.60*  --   CREATININE 1.23  --   --   --  1.24  --  1.06  TROPONINI <0.03  --  0.06* 0.08*  --   --   --     Estimated Creatinine Clearance: 59 mL/min (by C-G formula based on SCr of 1.06 mg/dL).   Medical History: Past Medical History:  Diagnosis Date  . H/O carotid endarterectomy   . Hearing loss   . High cholesterol   . Hypertension   . Neuropathy   . PAD (peripheral artery disease) (HCC)     Medications:  Prescriptions Prior to Admission  Medication Sig Dispense Refill Last Dose  . aspirin EC 81 MG tablet Take 81 mg by mouth daily.   04/25/2017 at 0800  . cilostazol (PLETAL) 100 MG tablet Take 100 mg by mouth 2 (two) times daily.   04/25/2017 at 0800  . Cinnamon 500 MG capsule Take 500 mg by mouth daily.    04/25/2017 at 0800  . Co-Enzyme Q-10 30 MG CAPS Take by mouth daily.     04/25/2017 at 0800  . diltiazem (DILACOR XR) 180 MG 24 hr capsule Take 180 mg by mouth daily.   04/25/2017 at 0800  . metoprolol succinate (TOPROL-XL) 50 MG 24 hr tablet Take 25 mg by mouth daily. Take with or immediately following a meal.   04/25/2017 at 0800  . niacin (NIASPAN) 1000 MG CR tablet Take 1,000 mg by mouth daily.   04/25/2017 at 0800  . HYDROcodone-acetaminophen (NORCO) 7.5-325 MG per tablet Take 1 tablet by mouth every 6 (six) hours as needed. Take 1 tablet by mouth every 4 to 6 hours as needed for pain   Not Taking at Unknown time  . traMADol (ULTRAM) 50 MG tablet Take by mouth every 8 (eight) hours as needed.   prn at prn   Scheduled:  . aspirin  81 mg Oral Pre-Cath  .  diltiazem  240 mg Oral Daily  . furosemide  60 mg Intravenous BID  . metoprolol tartrate  25 mg Oral BID  . sodium chloride flush  3 mL Intravenous Q12H  . sodium chloride flush  3 mL Intravenous Q12H   Infusions:  . sodium chloride    . [START ON 04/29/2017] sodium chloride     Followed by  . [START ON 04/29/2017] sodium chloride    . heparin 15 Units/kg/hr (04/27/17 2148)   PRN: sodium chloride, acetaminophen **OR** acetaminophen, albuterol, guaiFENesin-dextromethorphan, ondansetron **OR** ondansetron (ZOFRAN) IV, polyethylene glycol, sodium chloride flush Anti-infectives    None      Assessment: Patient had am dose of apixaban at 10am, will start heparin gtt at 2200 per protocol. No bolus, Dr Jeri Lager is going to do cath Monday AM he told me via phone. Target 0.3-0.7 anti Xa level, but will need to adjust via aPTT until levels correlate.    Goal of Therapy:  APTT 66 - 109 sec Heparin level 0.3-0.7 units/ml Monitor platelets by anticoagulation protocol: Yes   Plan:  Start heparin infusion at 1300 units/hr Check anti-Xa level in 8 hours and daily while on heparin Continue to monitor H&H and platelets   10/14 @ 0300 aPTT 68 therapeutic. Will continue current rate and will recheck HL/aPTT @  1100.  Thomasene Ripple, PharmD, BCPS Clinical Pharmacist 04/28/2017

## 2017-04-29 ENCOUNTER — Encounter
Admission: EM | Disposition: A | Discharge status: Other Acute Inpatient Hospital | Payer: Self-pay | Source: Home / Self Care | Attending: Internal Medicine

## 2017-04-29 ENCOUNTER — Encounter: Payer: Self-pay | Admitting: Certified Registered"

## 2017-04-29 ENCOUNTER — Encounter: Payer: Self-pay | Admitting: Cardiology

## 2017-04-29 DIAGNOSIS — I5023 Acute on chronic systolic (congestive) heart failure: Secondary | ICD-10-CM | POA: Insufficient documentation

## 2017-04-29 HISTORY — PX: RIGHT/LEFT HEART CATH AND CORONARY ANGIOGRAPHY: CATH118266

## 2017-04-29 LAB — BASIC METABOLIC PANEL
Anion gap: 11 (ref 5–15)
BUN: 16 mg/dL (ref 6–20)
CHLORIDE: 103 mmol/L (ref 101–111)
CO2: 26 mmol/L (ref 22–32)
CREATININE: 1.15 mg/dL (ref 0.61–1.24)
Calcium: 9.7 mg/dL (ref 8.9–10.3)
GFR calc non Af Amer: 57 mL/min — ABNORMAL LOW (ref >60)
Glucose, Bld: 112 mg/dL — ABNORMAL HIGH (ref 65–99)
Potassium: 3.4 mmol/L — ABNORMAL LOW (ref 3.5–5.1)
SODIUM: 140 mmol/L (ref 135–145)

## 2017-04-29 LAB — CBC
HCT: 46.1 % (ref 40.0–52.0)
HEMOGLOBIN: 15.8 g/dL (ref 13.0–18.0)
MCH: 30.7 pg (ref 26.0–34.0)
MCHC: 34.3 g/dL (ref 32.0–36.0)
MCV: 89.6 fL (ref 80.0–100.0)
PLATELETS: 268 10*3/uL (ref 150–440)
RBC: 5.14 MIL/uL (ref 4.40–5.90)
RDW: 14.4 % (ref 11.5–14.5)
WBC: 10.1 10*3/uL (ref 3.8–10.6)

## 2017-04-29 LAB — HEPARIN LEVEL (UNFRACTIONATED): HEPARIN UNFRACTIONATED: 1.64 [IU]/mL — AB (ref 0.30–0.70)

## 2017-04-29 LAB — APTT: APTT: 90 s — AB (ref 24–36)

## 2017-04-29 SURGERY — RIGHT/LEFT HEART CATH AND CORONARY ANGIOGRAPHY
Anesthesia: Moderate Sedation

## 2017-04-29 MED ORDER — HEPARIN (PORCINE) IN NACL 2-0.9 UNIT/ML-% IJ SOLN
INTRAMUSCULAR | Status: AC
  Filled 2017-04-29: qty 500

## 2017-04-29 MED ORDER — SODIUM CHLORIDE 0.9 % IV SOLN: 250.0000 mL | INTRAVENOUS | Status: DC | PRN

## 2017-04-29 MED ORDER — FENTANYL CITRATE (PF) 100 MCG/2ML IJ SOLN
INTRAMUSCULAR | Status: AC
  Filled 2017-04-29: qty 2

## 2017-04-29 MED ORDER — SODIUM CHLORIDE 0.9 % WEIGHT BASED INFUSION
3.0000 mL/kg/h | INTRAVENOUS | Status: AC
  Administered 2017-04-29: 3 mL/kg/h via INTRAVENOUS

## 2017-04-29 MED ORDER — METHYLPREDNISOLONE SODIUM SUCC 125 MG IJ SOLR
125.0000 mg | Freq: Once | INTRAMUSCULAR | Status: AC
  Administered 2017-04-29: 125 mg via INTRAVENOUS

## 2017-04-29 MED ORDER — IOPAMIDOL (ISOVUE-300) INJECTION 61%
INTRAVENOUS | Status: DC | PRN
  Administered 2017-04-29: 110 mL via INTRAVENOUS

## 2017-04-29 MED ORDER — FUROSEMIDE 20 MG PO TABS: 20.0000 mg | ORAL_TABLET | Freq: Two times a day (BID) | ORAL | 0 refills | Status: DC

## 2017-04-29 MED ORDER — SODIUM CHLORIDE 0.9% FLUSH: 3.0000 mL | INTRAVENOUS | Status: DC | PRN

## 2017-04-29 MED ORDER — SODIUM CHLORIDE 0.9% FLUSH: 3.0000 mL | Freq: Two times a day (BID) | INTRAVENOUS | Status: DC

## 2017-04-29 MED ORDER — METOPROLOL SUCCINATE ER 50 MG PO TB24: 50.0000 mg | ORAL_TABLET | Freq: Every day | ORAL | 0 refills | Status: DC

## 2017-04-29 MED ORDER — FENTANYL CITRATE (PF) 100 MCG/2ML IJ SOLN
INTRAMUSCULAR | Status: DC | PRN
  Administered 2017-04-29: 25 ug via INTRAVENOUS

## 2017-04-29 MED ORDER — HEPARIN (PORCINE) IN NACL 100-0.45 UNIT/ML-% IJ SOLN: 15.0000 [IU]/kg/h | INTRAMUSCULAR | 0 refills | Status: DC

## 2017-04-29 MED ORDER — ASPIRIN 81 MG PO CHEW
81.0000 mg | CHEWABLE_TABLET | ORAL | Status: AC
  Administered 2017-04-29: 81 mg via ORAL

## 2017-04-29 MED ORDER — SODIUM CHLORIDE 0.9 % WEIGHT BASED INFUSION: 1.0000 mL/kg/h | INTRAVENOUS | Status: DC

## 2017-04-29 MED ORDER — METHYLPREDNISOLONE SODIUM SUCC 125 MG IJ SOLR
INTRAMUSCULAR | Status: AC
  Administered 2017-04-29: 14:00:00
  Filled 2017-04-29: qty 2

## 2017-04-29 MED ORDER — FAMOTIDINE 20 MG PO TABS
ORAL_TABLET | ORAL | Status: AC
  Administered 2017-04-29: 40 mg via ORAL
  Filled 2017-04-29: qty 1

## 2017-04-29 MED ORDER — DIPHENHYDRAMINE HCL 50 MG/ML IJ SOLN
25.0000 mg | Freq: Once | INTRAMUSCULAR | Status: AC
  Administered 2017-04-29: 25 mg via INTRAVENOUS

## 2017-04-29 MED ORDER — ASPIRIN 81 MG PO CHEW: 81.0000 mg | CHEWABLE_TABLET | ORAL | Status: DC

## 2017-04-29 MED ORDER — SODIUM CHLORIDE 0.9 % WEIGHT BASED INFUSION: 3.0000 mL/kg/h | INTRAVENOUS | Status: DC

## 2017-04-29 MED ORDER — FAMOTIDINE 20 MG PO TABS
40.0000 mg | ORAL_TABLET | Freq: Every day | ORAL | Status: DC
  Administered 2017-04-29 (×2): 40 mg via ORAL
  Filled 2017-04-29: qty 2

## 2017-04-29 MED ORDER — MIDAZOLAM HCL 2 MG/2ML IJ SOLN
INTRAMUSCULAR | Status: DC | PRN
  Administered 2017-04-29: 1 mg via INTRAVENOUS

## 2017-04-29 MED ORDER — POTASSIUM CHLORIDE CRYS ER 20 MEQ PO TBCR
40.0000 meq | EXTENDED_RELEASE_TABLET | Freq: Every day | ORAL | Status: DC
  Administered 2017-04-29: 40 meq via ORAL
  Filled 2017-04-29: qty 2

## 2017-04-29 MED ORDER — ASPIRIN 81 MG PO CHEW
CHEWABLE_TABLET | ORAL | Status: AC
  Administered 2017-04-29: 81 mg via ORAL
  Filled 2017-04-29: qty 1

## 2017-04-29 MED ORDER — MIDAZOLAM HCL 2 MG/2ML IJ SOLN
INTRAMUSCULAR | Status: AC
  Filled 2017-04-29: qty 2

## 2017-04-29 MED ORDER — DILTIAZEM HCL ER COATED BEADS 240 MG PO CP24: 240.0000 mg | ORAL_CAPSULE | Freq: Every day | ORAL | 0 refills | Status: DC

## 2017-04-29 MED ORDER — POTASSIUM CHLORIDE CRYS ER 20 MEQ PO TBCR: 40.0000 meq | EXTENDED_RELEASE_TABLET | Freq: Every day | ORAL | 0 refills | Status: DC

## 2017-04-29 MED ORDER — DIPHENHYDRAMINE HCL 50 MG/ML IJ SOLN
INTRAMUSCULAR | Status: AC
  Administered 2017-04-29: 14:00:00
  Filled 2017-04-29: qty 1

## 2017-04-29 SURGICAL SUPPLY — 14 items
CATH 5FR JL4 DIAGNOSTIC (CATHETERS) ×2 IMPLANT
CATH INFINITI JR4 5F (CATHETERS) ×2 IMPLANT
CATH LANGSTON DUAL LUM PIG 6FR (CATHETERS) ×2 IMPLANT
CATH SWANZ 7F THERMO (CATHETERS) ×2 IMPLANT
GUIDEWIRE EMER 3M J .025X150CM (WIRE) ×2 IMPLANT
KIT MANI 3VAL PERCEP (MISCELLANEOUS) ×3 IMPLANT
KIT RIGHT HEART (MISCELLANEOUS) ×2 IMPLANT
NDL PERC 18GX7CM (NEEDLE) IMPLANT
NEEDLE PERC 18GX7CM (NEEDLE) ×3 IMPLANT
PACK CARDIAC CATH (CUSTOM PROCEDURE TRAY) ×3 IMPLANT
SHEATH AVANTI 6FR X 11CM (SHEATH) ×2 IMPLANT
SHEATH PINNACLE 7F 10CM (SHEATH) ×2 IMPLANT
WIRE EMERALD 3MM-J .035X150CM (WIRE) ×2 IMPLANT
WIRE EMERALD ST .035X150CM (WIRE) ×2 IMPLANT

## 2017-04-29 NOTE — Discharge Summary (Signed)
[facility] [place] [facility]   PATIENT NAME: Lyndell Allaire    MR#:  [id number]  DATE OF BIRTH:  1932-07-17  DATE OF ADMISSION:  04/26/2017 ADMITTING PHYSICIAN: Milagros Loll, MD  DATE OF DISCHARGE: 04/29/17  PRIMARY CARE PHYSICIAN: Danella Penton, MD    ADMISSION DIAGNOSIS:  Hypoxia [R09.02] Atrial fibrillation with rapid ventricular response (HCC) [I48.91] Acute congestive heart failure, unspecified heart failure type (HCC) [I50.9]  DISCHARGE DIAGNOSIS:  Acute congestive heart failure systolic New-onset A. Fib with RVR Coronary artery disease---severe three-vessel disease as noted on cardiaccatheterization--- CABG recommended  SECONDARY DIAGNOSIS:   Past Medical History:  Diagnosis Date  . H/O carotid endarterectomy   . Hearing loss   . High cholesterol   . Hypertension   . Neuropathy   . PAD (peripheral artery disease) [facility])     HOSPITAL COURSE:  Ulyses Panico  is a 83 y.o. male with a known history of Hypertension, systolic CHF with ejection fraction 45% presents to the emergency room due to worsening shortness of breath, orthopnea and palpitations. Patient was seen by his primary care physician recently in the office and started on Cardizem 180 mg with new onset atrial fibrillation along with aspirin  * Afib with RVR--new He was on cardizem drip--now on Cardizem CD 240 daily Continue Toprol-XL 25 mg daily -on iv heparin drip. Per Dr. Darrold Junker, Continue metoprolol and Cardizem CD for rate control.  *elevated troponin second demand ischemia from A. Fib with RVR congestive heart failure and cardiac catheterization showing coronary artery disease three-vessel -Patient to be transferred to [facility] CABG -Excepting cardio thoracic surgeon is Dr. Mallie Darting  * Acute on chronic systolic CHF, LV EF: 30% - 35% Continue IV Lasix, Beta blockers. Defer ACEI at present -change to oral Lasix 20 twice a  day -consider AICD placement later date per cardiology recommendation.  * Acute respiratory failure with hypoxia due to above. Try to wean off oxygen, CPAP when necessary. Continue Lasix.  * Hypertension Continue metoprolol and Cardizem CD lopressor  *Elevated bilirubin. Unclear etiology. Abdominal ultrasound: Cholelithiasis (solitary stone noted at the neck of the GB) and gallbladder polyp.  *hypokalemia -ccontinue daily potassium supplement  Discussed with patient's wife regarding transfer to [facility] for CABG she is agreeable with the plan  Patient is a full code  CONSULTS OBTAINED:  Treatment Team:  Marcina Millard, MD  DRUG ALLERGIES:   Allergies  Allergen Reactions  . Percocet [Oxycodone-Acetaminophen] Other (See Comments)    Confusion/hallucinations  . Iodinated Diagnostic Agents Other (See Comments)     Was very "flushed", denies itching, swelling or hives. He states this was over 30 years ago with Ionic contrast and not the current non-ionic contrast. SPM    DISCHARGE MEDICATIONS:   Current Discharge Medication List    START taking these medications   Details  diltiazem (CARDIZEM CD) 240 MG 24 hr capsule Take 1 capsule (240 mg total) by mouth daily. Qty: 30 capsule, Refills: 0    furosemide (LASIX) 20 MG tablet Take 1 tablet (20 mg total) by mouth 2 (two) times daily. Qty: 60 tablet, Refills: 0    heparin 100-0.45 UNIT/ML-% infusion Inject 1,300 Units/hr into the vein continuous. Qty: 250 mL, Refills: 0    potassium chloride SA (K-DUR,KLOR-CON) 20 MEQ tablet Take 2 tablets (40 mEq total) by mouth daily. Qty: 30 tablet, Refills: 0      CONTINUE these medications which have CHANGED   Details  metoprolol succinate (TOPROL-XL)  50 MG 24 hr tablet Take 1 tablet (50 mg total) by mouth daily. Take with or immediately following a meal. Qty: 30 tablet, Refills: 0      CONTINUE these medications which have NOT CHANGED   Details  aspirin EC 81 MG tablet  Take 81 mg by mouth daily.    cilostazol (PLETAL) 100 MG tablet Take 100 mg by mouth 2 (two) times daily.    Cinnamon 500 MG capsule Take 500 mg by mouth daily.     Co-Enzyme Q-10 30 MG CAPS Take by mouth daily.     niacin (NIASPAN) 1000 MG CR tablet Take 1,000 mg by mouth daily.    traMADol (ULTRAM) 50 MG tablet Take by mouth every 8 (eight) hours as needed.      STOP taking these medications     diltiazem (DILACOR XR) 180 MG 24 hr capsule      HYDROcodone-acetaminophen (NORCO) 7.5-325 MG per tablet         If you experience worsening of your admission symptoms, develop shortness of breath, life threatening emergency, suicidal or homicidal thoughts you must seek medical attention immediately by calling 911 or calling your MD immediately  if symptoms less severe.  You Must read complete instructions/literature along with all the possible adverse reactions/side effects for all the Medicines you take and that have been prescribed to you. Take any new Medicines after you have completely understood and accept all the possible adverse reactions/side effects.   Please note  You were cared for by a hospitalist during your hospital stay. If you have any questions about your discharge medications or the care you received while you were in the hospital after you are discharged, you can call the unit and asked to speak with the hospitalist on call if the hospitalist that took care of you is not available. Once you are discharged, your primary care physician will handle any further medical issues. Please note that NO REFILLS for any discharge medications will be authorized once you are discharged, as it is imperative that you return to your primary care physician (or establish a relationship with a primary care physician if you do not have one) for your aftercare needs so that they can reassess your need for medications and monitor your lab values. Today   SUBJECTIVE   Patient is somewhat sleepy  after cardiac cath Wife in the room Vitals stable VITAL SIGNS:  Blood pressure 118/78, pulse 85, temperature 98.3 F (36.8 C), resp. rate 18, height 6' (1.829 m), weight 82.6 kg (182 lb), SpO2 96 %.  I/O:   Intake/Output Summary (Last 24 hours) at 04/29/17 1202 Last data filed at 04/29/17 0356  Gross per 24 hour  Intake           478.12 ml  Output             1200 ml  Net          -721.88 ml    PHYSICAL EXAMINATION:  GENERAL:  83 y.o.-year-old patient lying in the bed with no acute distress.  EYES: Pupils equal, round, reactive to light and accommodation. No scleral icterus. Extraocular muscles intact.  HEENT: Head atraumatic, normocephalic. Oropharynx and nasopharynx clear.  NECK:  Supple, no jugular venous distention. No thyroid enlargement, no tenderness.  LUNGS: Normal breath sounds bilaterally, no wheezing, rales,rhonchi or crepitation. No use of accessory muscles of respiration.  CARDIOVASCULAR: S1, S2 normal. No murmurs, rubs, or gallops.  ABDOMEN: Soft, non-tender, non-distended. Bowel sounds present. No  organomegaly or mass.  EXTREMITIES: No pedal edema, cyanosis, or clubbing.  NEUROLOGIC: Cranial nerves II through XII are intact. Muscle strength 5/5 in all extremities. Sensation intact. Gait not checked.  PSYCHIATRIC: The patient is alert and oriented x 3.  SKIN: No obvious rash, lesion, or ulcer.   DATA REVIEW:   CBC   Recent Labs Lab 04/29/17 0515  WBC 10.1  HGB 15.8  HCT 46.1  PLT 268    Chemistries   Recent Labs Lab 04/26/17 0920 04/27/17 0638 04/28/17 0308 04/29/17 0515  NA 140 142 139 140  K 3.7 3.4* 3.4* 3.4*  CL 109 110 102 103  CO2 20* GLUCOSE 149* 130* 112* 112*  BUN CREATININE 1.23 1.24 1.06 1.15  CALCIUM 9.5 9.4 9.4 9.7  MG 2.0 2.2  --   --   AST 31  --   --   --   ALT 23  --   --   --   ALKPHOS 79  --   --   --   BILITOT 2.4*  --  2.1*  --     Microbiology Results   No results found for this or any  previous visit (from the past 240 hour(s)).  RADIOLOGY:  US Abdomen Limited Ruq  Result Date: 04/28/2017 CLINICAL DATA:  Elevated bilirubin level. EXAM: ULTRASOUND ABDOMEN LIMITED RIGHT UPPER QUADRANT COMPARISON:  None. FINDINGS: Gallbladder: 1.9 cm gallstone is noted in neck of gallbladder. 3 mm polyp is noted. No gallbladder wall thickening or pericholecystic fluid is noted. No sonographic Murphy's sign is noted. Common bile duct: Diameter: 3 mm which is within normal limits. Liver: No focal lesion identified. Within normal limits in parenchymal echogenicity. Portal vein is patent on color Doppler imaging with normal direction of blood flow towards the liver. IMPRESSION: Solitary gallstone seen. Small gallbladder polyp. No evidence of cholecystitis. No other abnormality seen in the right upper quadrant of the abdomen. Electronically Signed   By: Lupita Raider, M.D.   On: 04/28/2017 10:16     Management plans discussed with the patient, family and they are in agreement.  CODE STATUS:     Code Status Orders        Start     Ordered   04/26/17 1106  Full code  Continuous     04/26/17 1110    Code Status History    Date Active Date Inactive Code Status Order ID Comments User Context   This patient has a current code status but no historical code status.    Advance Directive Documentation     Most Recent Value  Type of Advance Directive  Healthcare Power of Attorney, Living will Denton Lank will bring document into the facility on next visit]  Pre-existing out of facility DNR order (yellow form or pink MOST form)  -  "MOST" Form in Place?  -      TOTAL TIME TAKING CARE OF THIS PATIENT: *40 minutes.    Kenlei Safi M.D on 04/29/2017 at 12:02 PM  Between 7am to 6pm - Pager - [phone] After 6pm go to [email or web address] - Social research officer, government  [facility]  Office  [phone]  CC: Primary care physician; Danella Penton, MD

## 2017-04-29 NOTE — Care Management Important Message (Signed)
Important Message  Patient Details  Name: Justin Mendez MRN: [id number] Date of Birth: 11/29/1932   Medicare Important Message Given:  Yes    Gwenette Greet, RN 04/29/2017, 9:42 AM

## 2017-04-29 NOTE — Progress Notes (Signed)
Pt transferred to [facility] room 7120 via [facility]. Report given to [facility] and RN assuming care at [facility]. Pt's family updated and at the bedside at time of transfer. Heparin gtt infusing at 51ml/hr on transfer. VSS.

## 2017-04-29 NOTE — Progress Notes (Signed)
ANTICOAGULATION CONSULT NOTE - Initial Consult  Pharmacy Consult for heparin gtt after apixaban Indication: atrial fibrillation  Allergies  Allergen Reactions  . Percocet [Oxycodone-Acetaminophen] Other (See Comments)    Confusion/hallucinations  . Iodinated Diagnostic Agents Other (See Comments)     Was very "flushed", denies itching, swelling or hives. He states this was over 30 years ago with Ionic contrast and not the current non-ionic contrast. SPM    Patient Measurements: Height: 6' (182.9 cm) Weight: 182 lb (82.6 kg) IBW/kg (Calculated) : 77.6   Vital Signs: Temp: 97.9 F (36.6 C) (10/15 0735) Temp Source: Oral (10/15 0735) BP: 118/74 (10/15 0945) Pulse Rate: 69 (10/15 0945)  Labs:  Recent Labs  04/26/17 1238 04/26/17 1520 04/26/17 2045  04/27/17 0638  04/28/17 0308 04/28/17 1100 04/28/17 1621 04/29/17 0515  HGB  --   --   --   < > 14.8  --   --   --  17.1 15.8  HCT  --   --   --   --  43.0  --   --   --  49.4 46.1  PLT  --   --   --   --  255  --   --   --  328 268  APTT 28  --   --   --   --   --  68* 74*  --  90*  LABPROT 14.8  --   --   --   --   --   --   --   --   --   INR 1.17  --   --   --   --   --   --   --   --   --   HEPARINUNFRC  --   --   --   --   --   < > 3.46* 3.02*  --  1.64*  CREATININE  --   --   --   --  1.24  --  1.06  --   --  1.15  TROPONINI  --  0.06* 0.08*  --   --   --   --   --   --   --   < > = values in this interval not displayed.  Estimated Creatinine Clearance: 54.4 mL/min (by C-G formula based on SCr of 1.15 mg/dL).   Medical History: Past Medical History:  Diagnosis Date  . H/O carotid endarterectomy   . Hearing loss   . High cholesterol   . Hypertension   . Neuropathy   . PAD (peripheral artery disease) (HCC)     Assessment: Patient had am dose of apixaban at 10am, will start heparin gtt at 2200 per protocol. No bolus, Dr Jeri Lager is going to do cath Monday AM he told me via phone.  Target 0.3-0.7 anti Xa  level, but will need to adjust via aPTT until levels correlate.   Goal of Therapy:  APTT 66 - 109 sec Heparin level 0.3-0.7 units/ml Monitor platelets by anticoagulation protocol: Yes   Plan:  Current orders for heparin 15units/kg/hr. APTT therapeutic. Continue current rate. HL and APTT do not correlate. Continue to dose off of APTT. Recheck HL and APTT/CBC tomorrow AM.  Olene Floss, Pharm.D, BCPS Clinical Pharmacist  04/29/2017 9:53 AM

## 2017-04-29 NOTE — Care Management Note (Signed)
Case Management Note  Patient Details  Name: Justin Mendez MRN: [id number] Date of Birth: June 08, 1933  Subjective/Objective:      Admitted to [facility] with the diagnosis of A-Fib. Lives with wife, Leonel Ramsay [phone]). Last seen Dr Hyacinth Meeker on Thursday of last week. Prescriptions are filled per [facility]. Home Health per [facility] in the past. No skilled facility. No medical equipment in the home. Takes care of all basic activities of daily living himself, drives. Works at [employer]. No falls. 15 pound weight loss.                Action/Plan: Transferring to [facility] for further work-up  Expected Discharge Date:  04/28/17               Expected Discharge Plan:     In-House Referral:     Discharge planning Services     Post Acute Care Choice:    Choice offered to:     DME Arranged:    DME Agency:     HH Arranged:    HH Agency:     Status of Service:     If discussed at [employer], dates discussed:    Additional Comments:  Gwenette Greet, RN MSN CCM Care Management [phone] 04/29/2017, 11:32 AM

## 2017-04-29 NOTE — Progress Notes (Signed)
Pt has returned from cath procedure. Denies pain. VSS. R groin dressing clean, dry and intact. Surrounding area of site is soft with no bruising. Pt will be transferring to [facility] later today. Pt's family at the bedside and have been updated on the plan of care.

## 2017-05-02 ENCOUNTER — Encounter (INDEPENDENT_AMBULATORY_CARE_PROVIDER_SITE_OTHER): Payer: Self-pay | Admitting: Vascular Surgery

## 2017-05-07 DIAGNOSIS — I2510 Atherosclerotic heart disease of native coronary artery without angina pectoris: Secondary | ICD-10-CM | POA: Insufficient documentation

## 2017-05-07 HISTORY — PX: CARDIAC CATHETERIZATION: SHX172

## 2017-06-27 ENCOUNTER — Encounter: Payer: Self-pay | Admitting: *Deleted

## 2017-06-27 ENCOUNTER — Encounter: Payer: Medicare Other | Attending: Internal Medicine | Admitting: *Deleted

## 2017-06-27 VITALS — Ht 72.5 in | Wt 181.6 lb

## 2017-06-27 DIAGNOSIS — I5022 Chronic systolic (congestive) heart failure: Secondary | ICD-10-CM | POA: Diagnosis not present

## 2017-06-27 DIAGNOSIS — G609 Hereditary and idiopathic neuropathy, unspecified: Secondary | ICD-10-CM | POA: Insufficient documentation

## 2017-06-27 NOTE — Progress Notes (Signed)
Daily Session Note  Patient Details  Name: Dagmawi Venable MRN: [id number] Date of Birth: Apr 25, 1933 Referring Provider:     Cardiac Rehab from 06/27/2017 in [facility] Cardiac and Pulmonary Rehab  Referring Provider  Sabra Heck      Encounter Date: 06/27/2017  Check In: Session Check In - 06/27/17 1337      Check-In   Location  [facility]-Cardiac & Pulmonary Rehab    Staff Present  Renita Papa, RN Vickki Hearing, BA, ACSM CEP, Exercise Physiologist    Supervising physician immediately available to respond to emergencies  See telemetry face sheet for immediately available ER MD    Medication changes reported      No    Fall or balance concerns reported     No    Warm-up and Cool-down  Performed as group-led instruction    Resistance Training Performed  Yes    VAD Patient?  No      Pain Assessment   Currently in Pain?  No/denies        Exercise Prescription Changes - 06/27/17 1400      Response to Exercise   Blood Pressure (Admit)  114/66    Blood Pressure (Exercise)  154/72    Blood Pressure (Exit)  128/70    Heart Rate (Admit)  81 bpm    Heart Rate (Exercise)  119 bpm    Heart Rate (Exit)  82 bpm    Oxygen Saturation (Admit)  97 %    Oxygen Saturation (Exit)  99 %    Rating of Perceived Exertion (Exercise)  14       Social History   Tobacco Use  Smoking Status Former Smoker  . Types: Cigarettes  . Last attempt to quit: 08/31/1984  . Years since quitting: 32.8  Smokeless Tobacco Never Used    Goals Met:  Proper associated with RPD/PD & O2 Sat Exercise tolerated well No report of cardiac concerns or symptoms Strength training completed today  Goals Unmet:  Not Applicable  Comments: Med Review completed    Dr. Emily Filbert is Medical Director for [place] [facility].

## 2017-06-27 NOTE — Progress Notes (Signed)
Cardiac Individual Treatment Plan  Patient Details  Name: Justin Mendez MRN: [id number] Date of Birth: June 01, 1933 Referring Provider:     Cardiac Rehab from 06/27/2017 in [facility] Cardiac and Pulmonary Rehab  Referring Provider  Hyacinth Meeker      Initial Encounter Date:    Cardiac Rehab from 06/27/2017 in [facility] Cardiac and Pulmonary Rehab  Date  06/27/17  Referring Provider  Hyacinth Meeker      Visit Diagnosis: Heart failure, chronic systolic (HCC)  Patient's Home Medications on Admission:  Current Outpatient Medications:  .  atorvastatin (LIPITOR) 80 MG tablet, , Disp: , Rfl: 2 .  clopidogrel (PLAVIX) 75 MG tablet, , Disp: , Rfl: 2 .  Co-Enzyme Q-10 30 MG CAPS, Take by mouth daily. , Disp: , Rfl:  .  ELIQUIS 5 MG TABS tablet, , Disp: , Rfl: 11 .  fluticasone (FLONASE) 50 MCG/ACT nasal spray, SHAKE LQ AND U 1 SPR IEN Q 12 H, Disp: , Rfl: 2 .  isosorbide mononitrate (IMDUR) 30 MG 24 hr tablet, , Disp: , Rfl: 2 .  losartan (COZAAR) 25 MG tablet, Take 25 mg by mouth daily., Disp: , Rfl: 3 .  metoprolol succinate (TOPROL-XL) 50 MG 24 hr tablet, Take 1 tablet (50 mg total) by mouth daily. Take with or immediately following a meal., Disp: 30 tablet, Rfl: 0 .  metoprolol succinate (TOPROL-XL) 50 MG 24 hr tablet, Take by mouth., Disp: , Rfl:  .  nitroGLYCERIN (NITROSTAT) 0.4 MG SL tablet, DIS 1 T UNT FOR CHEST PAIN UTD. MAY TAKE UP TO 3 DOSES, Disp: , Rfl: 0 .  pantoprazole (PROTONIX) 40 MG tablet, Take 40 mg by mouth daily., Disp: , Rfl: 2 .  sertraline (ZOLOFT) 25 MG tablet, , Disp: , Rfl: 2 .  spironolactone (ALDACTONE) 25 MG tablet, , Disp: , Rfl: 2 .  traMADol (ULTRAM) 50 MG tablet, Take by mouth every 8 (eight) hours as needed., Disp: , Rfl:  .  aspirin EC 81 MG tablet, Take 81 mg by mouth daily., Disp: , Rfl:  .  cilostazol (PLETAL) 100 MG tablet, Take 100 mg by mouth 2 (two) times daily., Disp: , Rfl:  .  Cinnamon 500 MG capsule, Take 500 mg by mouth daily. , Disp: , Rfl:  .  diltiazem  (CARDIZEM CD) 240 MG 24 hr capsule, Take 1 capsule (240 mg total) by mouth daily. (Patient not taking: Reported on 06/27/2017), Disp: 30 capsule, Rfl: 0 .  furosemide (LASIX) 20 MG tablet, Take 1 tablet (20 mg total) by mouth 2 (two) times daily., Disp: 60 tablet, Rfl: 0 .  heparin 100-0.45 UNIT/ML-% infusion, Inject 1,300 Units/hr into the vein continuous. (Patient not taking: Reported on 06/27/2017), Disp: 250 mL, Rfl: 0 .  niacin (NIASPAN) 1000 MG CR tablet, Take 1,000 mg by mouth daily., Disp: , Rfl:  .  potassium chloride SA (K-DUR,KLOR-CON) 20 MEQ tablet, Take 2 tablets (40 mEq total) by mouth daily. (Patient not taking: Reported on 06/27/2017), Disp: 30 tablet, Rfl: 0  Past Medical History: Past Medical History:  Diagnosis Date  . H/O carotid endarterectomy   . Hearing loss   . High cholesterol   . Hypertension   . Neuropathy   . PAD (peripheral artery disease) (HCC)     Tobacco Use: Social History   Tobacco Use  Smoking Status Former Smoker  . Types: Cigarettes  . Last attempt to quit: 08/31/1984  . Years since quitting: 32.8  Smokeless Tobacco Never Used    Labs: Recent Review Advice worker  There is no flowsheet data to display.       Exercise Target Goals: Date: 06/27/17  Exercise Program Goal: Individual exercise prescription set with THRR, safety & activity barriers. Participant demonstrates ability to understand and report RPE using BORG scale, to self-measure pulse accurately, and to acknowledge the importance of the exercise prescription.  Exercise Prescription Goal: Starting with aerobic activity 30 plus minutes a day, 3 days per week for initial exercise prescription. Provide home exercise prescription and guidelines that participant acknowledges understanding prior to discharge.  Activity Barriers & Risk Stratification: Activity Barriers & Cardiac Risk Stratification - 06/27/17 1415      Activity Barriers & Cardiac Risk Stratification   Activity  Barriers  Left Knee Replacement;Right Knee Replacement    Cardiac Risk Stratification  High       6 Minute Walk: 6 Minute Walk    Row Name 06/27/17 1424         6 Minute Walk   Distance  1088 feet     Walk Time  6 minutes     # of Rest Breaks  0     MPH  2.06     METS  2.56     RPE  14     Perceived Dyspnea   1     VO2 Peak  8.98     Symptoms  No     Resting HR  68 bpm     Resting BP  114/66     Resting Oxygen Saturation   97 %     Exercise Oxygen Saturation  during 6 min walk  99 %     Max Ex. HR  119 bpm     Max Ex. BP  154/72     2 Minute Post BP  128/70        Oxygen Initial Assessment:   Oxygen Re-Evaluation:   Oxygen Discharge (Final Oxygen Re-Evaluation):   Initial Exercise Prescription: Initial Exercise Prescription - 06/27/17 1400      Date of Initial Exercise RX and Referring Provider   Date  06/27/17    Referring Provider  Hyacinth Meeker      Treadmill   MPH  2    Grade  0    Minutes  15    METs  2.53      Recumbant Bike   Level  1    RPM  60    Watts  15    Minutes  15    METs  2.5      NuStep   Level  2    SPM  80    Minutes  15    METs  2.5      Prescription Details   Frequency (times per week)  3    Duration  Progress to 45 minutes of aerobic exercise without signs/symptoms of physical distress      Intensity   THRR 40-80% of Max Heartrate  96-124    Ratings of Perceived Exertion  11-13    Perceived Dyspnea  0-4      Resistance Training   Training Prescription  Yes    Weight  3 lb    Reps  10-15       Perform Capillary Blood Glucose checks as needed.  Exercise Prescription Changes: Exercise Prescription Changes    Row Name 06/27/17 1400             Response to Exercise   Blood Pressure (Admit)  114/66  Blood Pressure (Exercise)  154/72       Blood Pressure (Exit)  128/70       Heart Rate (Admit)  81 bpm       Heart Rate (Exercise)  119 bpm       Heart Rate (Exit)  82 bpm       Oxygen Saturation (Admit)  97  %       Oxygen Saturation (Exit)  99 %       Rating of Perceived Exertion (Exercise)  14          Exercise Comments:   Exercise Goals and Review: Exercise Goals    Row Name 06/27/17 1423             Exercise Goals   Increase Physical Activity  Yes       Intervention  Provide advice, education, support and counseling about physical activity/exercise needs.;Develop an individualized exercise prescription for aerobic and resistive training based on initial evaluation findings, risk stratification, comorbidities and participant's personal goals.       Expected Outcomes  Achievement of increased cardiorespiratory fitness and enhanced flexibility, muscular endurance and strength shown through measurements of functional capacity and personal statement of participant.       Increase Strength and Stamina  Yes       Intervention  Provide advice, education, support and counseling about physical activity/exercise needs.;Develop an individualized exercise prescription for aerobic and resistive training based on initial evaluation findings, risk stratification, comorbidities and participant's personal goals.       Expected Outcomes  Achievement of increased cardiorespiratory fitness and enhanced flexibility, muscular endurance and strength shown through measurements of functional capacity and personal statement of participant.       Able to understand and use rate of perceived exertion (RPE) scale  Yes       Intervention  Provide education and explanation on how to use RPE scale       Expected Outcomes  Short Term: Able to use RPE daily in rehab to express subjective intensity level;Long Term:  Able to use RPE to guide intensity level when exercising independently       Able to understand and use Dyspnea scale  Yes       Intervention  Provide education and explanation on how to use Dyspnea scale       Expected Outcomes  Short Term: Able to use Dyspnea scale daily in rehab to express subjective sense  of shortness of breath during exertion;Long Term: Able to use Dyspnea scale to guide intensity level when exercising independently       Knowledge and understanding of Target Heart Rate Range (THRR)  Yes       Intervention  Provide education and explanation of THRR including how the numbers were predicted and where they are located for reference       Expected Outcomes  Short Term: Able to state/look up THRR;Short Term: Able to use daily as guideline for intensity in rehab;Long Term: Able to use THRR to govern intensity when exercising independently       Able to check pulse independently  Yes       Intervention  Review the importance of being able to check your own pulse for safety during independent exercise;Provide education and demonstration on how to check pulse in carotid and radial arteries.       Expected Outcomes  Short Term: Able to explain why pulse checking is important during independent exercise;Long Term: Able to check pulse independently  and accurately       Understanding of Exercise Prescription  Yes       Intervention  Provide education, explanation, and written materials on patient's individual exercise prescription       Expected Outcomes  Short Term: Able to explain program exercise prescription;Long Term: Able to explain home exercise prescription to exercise independently          Exercise Goals Re-Evaluation :   Discharge Exercise Prescription (Final Exercise Prescription Changes): Exercise Prescription Changes - 06/27/17 1400      Response to Exercise   Blood Pressure (Admit)  114/66    Blood Pressure (Exercise)  154/72    Blood Pressure (Exit)  128/70    Heart Rate (Admit)  81 bpm    Heart Rate (Exercise)  119 bpm    Heart Rate (Exit)  82 bpm    Oxygen Saturation (Admit)  97 %    Oxygen Saturation (Exit)  99 %    Rating of Perceived Exertion (Exercise)  14       Nutrition:  Target Goals: Understanding of nutrition guidelines, daily intake of sodium 1500mg ,  cholesterol 200mg , calories 30% from fat and 7% or less from saturated fats, daily to have 5 or more servings of fruits and vegetables.  Biometrics: Pre Biometrics - 06/27/17 1423      Pre Biometrics   Height  6' 0.5" (1.842 m)    Weight  181 lb 9.6 oz (82.4 kg)    Waist Circumference  38.5 inches    Hip Circumference  41.5 inches    Waist to Hip Ratio  0.93 %    BMI (Calculated)  24.28    Single Leg Stand  1.35 seconds        Nutrition Therapy Plan and Nutrition Goals:   Nutrition Discharge: Rate Your Plate Scores: Nutrition Assessments - 06/27/17 1407      MEDFICTS Scores   Pre Score  63       Nutrition Goals Re-Evaluation:   Nutrition Goals Discharge (Final Nutrition Goals Re-Evaluation):   Psychosocial: Target Goals: Acknowledge presence or absence of significant depression and/or stress, maximize coping skills, provide positive support system. Participant is able to verbalize types and ability to use techniques and skills needed for reducing stress and depression.   Initial Review & Psychosocial Screening: Initial Psych Review & Screening - 06/27/17 1407      Initial Review   Current issues with  Current Stress Concerns    Source of Stress Concerns  Financial;Occupation;Unable to perform yard/household activities    Comments  He is trying to get back to work part time at [employer]. 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 He hates that his wife is having to work, but is looking forward to getting back to his job.       Family Dynamics   Good Support System?  Yes Wife and children      Screening Interventions   Interventions  Yes;Encouraged to exercise    Expected Outcomes  Short Term goal: Utilizing psychosocial counselor, staff and physician to assist with identification of specific Stressors or current issues interfering with healing process. Setting desired  goal for each stressor or current issue identified.;Long Term Goal: Stressors or current issues are controlled or eliminated.;Short Term goal: Identification and review with participant of any Quality of Life or Depression concerns found by scoring the questionnaire.;Long Term goal:  The participant improves quality of Life and PHQ9 Scores as seen by post scores and/or verbalization of changes       Quality of Life Scores:  Quality of Life - 06/27/17 1411      Quality of Life Scores   Health/Function Pre  22.29 %    Socioeconomic Pre  30 %    Psych/Spiritual Pre  22.5 %    Family Pre  30 %    GLOBAL Pre  25.42 %       PHQ-9: Recent Review Flowsheet Data    Depression screen [facility] 2/9 06/27/2017   Decreased Interest 0   Down, Depressed, Hopeless 2   PHQ - 2 Score 2   Altered sleeping 1   Tired, decreased energy 1   Change in appetite 0   Feeling bad or failure about yourself  0   Trouble concentrating 0   Moving slowly or fidgety/restless 0   Suicidal thoughts 0   PHQ-9 Score 4   Difficult doing work/chores Not difficult at all     Interpretation of Total Score  Total Score Depression Severity:  1-4 = Minimal depression, 5-9 = Mild depression, 10-14 = Moderate depression, 15-19 = Moderately severe depression, 20-27 = Severe depression   Psychosocial Evaluation and Intervention:   Psychosocial Re-Evaluation:   Psychosocial Discharge (Final Psychosocial Re-Evaluation):   Vocational Rehabilitation: Provide vocational rehab assistance to qualifying candidates.   Vocational Rehab Evaluation & Intervention: Vocational Rehab - 06/27/17 1413      Initial Vocational Rehab Evaluation & Intervention   Assessment shows need for Vocational Rehabilitation  No       Education: Education Goals: Education classes will be provided on a variety of topics geared toward better understanding of heart health and risk factor modification. Participant will state understanding/return  demonstration of topics presented as noted by education test scores.  Learning Barriers/Preferences: Learning Barriers/Preferences - 06/27/17 1412      Learning Barriers/Preferences   Learning Barriers  Hearing    Learning Preferences  Individual Instruction;Skilled Demonstration;Written Material       Education Topics: General Nutrition Guidelines/Fats and Fiber: -Group instruction provided by verbal, written material, models and posters to present the general guidelines for heart healthy nutrition. Gives an explanation and review of dietary fats and fiber.   Controlling Sodium/Reading Food Labels: -Group verbal and written material supporting the discussion of sodium use in heart healthy nutrition. Review and explanation with models, verbal and written materials for utilization of the food label.   Exercise Physiology & Risk Factors: - Group verbal and written instruction with models to review the exercise physiology of the cardiovascular system and associated critical values. Details cardiovascular disease risk factors and the goals associated with each risk factor.   Aerobic Exercise & Resistance Training: - Gives group verbal and written discussion on the health impact of inactivity. On the components of aerobic and resistive training programs and the benefits of this training and how to safely progress through these programs.   Flexibility, Balance, General Exercise Guidelines: - Provides group verbal and written instruction on the benefits of flexibility and balance training programs. Provides general exercise guidelines with specific guidelines to those with heart or lung disease. Demonstration and skill practice provided.   Stress Management: - Provides group verbal and written instruction about the health risks of elevated stress, cause of high stress, and healthy ways to reduce stress.   Depression: - Provides group verbal and written instruction on the correlation  between heart/lung disease and  depressed mood, treatment options, and the stigmas associated with seeking treatment.   Anatomy & Physiology of the Heart: - Group verbal and written instruction and models provide basic cardiac anatomy and physiology, with the coronary electrical and arterial systems. Review of: AMI, Angina, Valve disease, Heart Failure, Cardiac Arrhythmia, Pacemakers, and the ICD.   Cardiac Procedures: - Group verbal and written instruction to review commonly prescribed medications for heart disease. Reviews the medication, class of the drug, and side effects. Includes the steps to properly store meds and maintain the prescription regimen. (beta blockers and nitrates)   Cardiac Medications I: - Group verbal and written instruction to review commonly prescribed medications for heart disease. Reviews the medication, class of the drug, and side effects. Includes the steps to properly store meds and maintain the prescription regimen.   Cardiac Medications II: -Group verbal and written instruction to review commonly prescribed medications for heart disease. Reviews the medication, class of the drug, and side effects. (all other drug classes)    Go Sex-Intimacy & Heart Disease, Get SMART - Goal Setting: - Group verbal and written instruction through game format to discuss heart disease and the return to sexual intimacy. Provides group verbal and written material to discuss and apply goal setting through the application of the S.M.A.R.T. Method.   Other Matters of the Heart: - Provides group verbal, written materials and models to describe Heart Failure, Angina, Valve Disease, Peripheral Artery Disease, and Diabetes in the realm of heart disease. Includes description of the disease process and treatment options available to the cardiac patient.   Exercise & Equipment Safety: - Individual verbal instruction and demonstration of equipment use and safety with use of the  equipment.   Cardiac Rehab from 06/27/2017 in [facility] Cardiac and Pulmonary Rehab  Date  06/27/17  Educator  [facility]  Instruction Review Code  1- Verbalizes Understanding      Infection Prevention: - Provides verbal and written material to individual with discussion of infection control including proper hand washing and proper equipment cleaning during exercise session.   Cardiac Rehab from 06/27/2017 in [facility] Cardiac and Pulmonary Rehab  Date  06/27/17  Educator  [facility]  Instruction Review Code  1- Verbalizes Understanding      Falls Prevention: - Provides verbal and written material to individual with discussion of falls prevention and safety.   Cardiac Rehab from 06/27/2017 in [facility] Cardiac and Pulmonary Rehab  Date  06/27/17  Educator  [facility]  Instruction Review Code  1- Verbalizes Understanding      Diabetes: - Individual verbal and written instruction to review signs/symptoms of diabetes, desired ranges of glucose level fasting, after meals and with exercise. Acknowledge that pre and post exercise glucose checks will be done for 3 sessions at entry of program.   Other: -Provides group and verbal instruction on various topics (see comments)    Knowledge Questionnaire Score: Knowledge Questionnaire Score - 06/27/17 1413      Knowledge Questionnaire Score   Pre Score  22/28 correct answers reviewed with patient       Core Components/Risk Factors/Patient Goals at Admission: Personal Goals and Risk Factors at Admission - 06/27/17 1355      Core Components/Risk Factors/Patient Goals on Admission    Weight Management  Weight Maintenance;Yes    Intervention  Weight Management: Develop a combined nutrition and exercise program designed to reach desired caloric intake, while maintaining appropriate intake of nutrient and fiber, sodium and fats, and appropriate energy expenditure required for the weight goal.;Weight  Management: Provide education and appropriate resources to help participant  work on and attain dietary goals.;Weight Management/Obesity: Establish reasonable short term and long term weight goals.    Admit Weight  181 lb (82.1 kg) Weighing with clothes on, usually 174-175 at home. Would like to maintain that weight.    Goal Weight: Short Term  176 lb (79.8 kg)    Goal Weight: Long Term  175 lb (79.4 kg)    Expected Outcomes  Short Term: Continue to assess and modify interventions until short term weight is achieved;Long Term: Adherence to nutrition and physical activity/exercise program aimed toward attainment of established weight goal;Weight Maintenance: Understanding of the daily nutrition guidelines, which includes 25-35% calories from fat, 7% or less cal from saturated fats, less than 200mg  cholesterol, less than 1.5gm of sodium, & 5 or more servings of fruits and vegetables daily;Understanding recommendations for meals to include 15-35% energy as protein, 25-35% energy from fat, 35-60% energy from carbohydrates, less than 200mg  of dietary cholesterol, 20-35 gm of total fiber daily;Understanding of distribution of calorie intake throughout the day with the consumption of 4-5 meals/snacks    Heart Failure  Yes    Intervention  Provide a combined exercise and nutrition program that is supplemented with education, support and counseling about heart failure. Directed toward relieving symptoms such as shortness of breath, decreased exercise tolerance, and extremity edema.    Expected Outcomes  Improve functional capacity of life;Short term: Attendance in program 2-3 days a week with increased exercise capacity. Reported lower sodium intake. Reported increased fruit and vegetable intake. Reports medication compliance.;Short term: Daily weights obtained and reported for increase. Utilizing diuretic protocols set by physician.;Long term: Adoption of self-care skills and reduction of barriers for early signs and symptoms recognition and intervention leading to self-care maintenance.     Hypertension  Yes    Intervention  Provide education on lifestyle modifcations including regular physical activity/exercise, weight management, moderate sodium restriction and increased consumption of fresh fruit, vegetables, and low fat dairy, alcohol moderation, and smoking cessation.;Monitor prescription use compliance.    Expected Outcomes  Short Term: Continued assessment and intervention until BP is < 140/1mm HG in hypertensive participants. < 130/77mm HG in hypertensive participants with diabetes, heart failure or chronic kidney disease.;Long Term: Maintenance of blood pressure at goal levels.    Stress  Yes Trying to get back to work at [employer] part time. He encountered issues with his retirement when he retired early in life, so financial issues ar ebecoming a stressor.     Intervention  Offer individual and/or small group education and counseling on adjustment to heart disease, stress management and health-related lifestyle change. Teach and support self-help strategies.;Refer participants experiencing significant psychosocial distress to appropriate mental health specialists for further evaluation and treatment. When possible, include family members and significant others in education/counseling sessions.    Expected Outcomes  Short Term: Participant demonstrates changes in health-related behavior, relaxation and other stress management skills, ability to obtain effective social support, and compliance with psychotropic medications if prescribed.;Long Term: Emotional wellbeing is indicated by absence of clinically significant psychosocial distress or social isolation.       Core Components/Risk Factors/Patient Goals Review:    Core Components/Risk Factors/Patient Goals at Discharge (Final Review):    ITP Comments: ITP Comments    Row Name 06/27/17 1338           ITP Comments  Med Review completed. Initial ITP created. Diagnosis can be found in [facility] 04/26/17  Comments:  Initial ITP

## 2017-06-27 NOTE — Patient Instructions (Signed)
Patient Instructions  Patient Details  Name: Justin Mendez MRN: [id number] Date of Birth: 04/30/1933 Referring Provider:  Danella Penton, MD  Below are the personal goals you chose as well as exercise and nutrition goals. Our goal is to help you keep on track towards obtaining and maintaining your goals. We will be discussing your progress on these goals with you throughout the program.  Initial Exercise Prescription: Initial Exercise Prescription - 06/27/17 1400      Date of Initial Exercise RX and Referring Provider   Date  06/27/17    Referring Provider  Hyacinth Meeker      Treadmill   MPH  2    Grade  0    Minutes  15    METs  2.53      Recumbant Bike   Level  1    RPM  60    Watts  15    Minutes  15    METs  2.5      NuStep   Level  2    SPM  80    Minutes  15    METs  2.5      Prescription Details   Frequency (times per week)  3    Duration  Progress to 45 minutes of aerobic exercise without signs/symptoms of physical distress      Intensity   THRR 40-80% of Max Heartrate  96-124    Ratings of Perceived Exertion  11-13    Perceived Dyspnea  0-4      Resistance Training   Training Prescription  Yes    Weight  3 lb    Reps  10-15       Exercise Goals: Frequency: Be able to perform aerobic exercise three times per week working toward 3-5 days per week.  Intensity: Work with a perceived exertion of 11 (fairly light) - 15 (hard) as tolerated. Follow your new exercise prescription and watch for changes in prescription as you progress with the program. Changes will be reviewed with you when they are made.  Duration: You should be able to do 30 minutes of continuous aerobic exercise in addition to a 5 minute warm-up and a 5 minute cool-down routine.  Nutrition Goals: Your personal nutrition goals will be established when you do your nutrition analysis with the dietician.  The following are nutrition guidelines to follow: Cholesterol < 200mg /day Sodium <  1500mg /day Fiber: Men over 50 yrs - 30 grams per day  Personal Goals: Personal Goals and Risk Factors at Admission - 06/27/17 1355      Core Components/Risk Factors/Patient Goals on Admission    Weight Management  Weight Maintenance;Yes    Intervention  Weight Management: Develop a combined nutrition and exercise program designed to reach desired caloric intake, while maintaining appropriate intake of nutrient and fiber, sodium and fats, and appropriate energy expenditure required for the weight goal.;Weight Management: Provide education and appropriate resources to help participant work on and attain dietary goals.;Weight Management/Obesity: Establish reasonable short term and long term weight goals.    Admit Weight  181 lb (82.1 kg) Weighing with clothes on, usually 174-175 at home. Would like to maintain that weight.    Goal Weight: Short Term  176 lb (79.8 kg)    Goal Weight: Long Term  175 lb (79.4 kg)    Expected Outcomes  Short Term: Continue to assess and modify interventions until short term weight is achieved;Long Term: Adherence to nutrition and physical activity/exercise program aimed toward attainment of  established weight goal;Weight Maintenance: Understanding of the daily nutrition guidelines, which includes 25-35% calories from fat, 7% or less cal from saturated fats, less than 200mg  cholesterol, less than 1.5gm of sodium, & 5 or more servings of fruits and vegetables daily;Understanding recommendations for meals to include 15-35% energy as protein, 25-35% energy from fat, 35-60% energy from carbohydrates, less than 200mg  of dietary cholesterol, 20-35 gm of total fiber daily;Understanding of distribution of calorie intake throughout the day with the consumption of 4-5 meals/snacks    Heart Failure  Yes    Intervention  Provide a combined exercise and nutrition program that is supplemented with education, support and counseling about heart failure. Directed toward relieving symptoms such  as shortness of breath, decreased exercise tolerance, and extremity edema.    Expected Outcomes  Improve functional capacity of life;Short term: Attendance in program 2-3 days a week with increased exercise capacity. Reported lower sodium intake. Reported increased fruit and vegetable intake. Reports medication compliance.;Short term: Daily weights obtained and reported for increase. Utilizing diuretic protocols set by physician.;Long term: Adoption of self-care skills and reduction of barriers for early signs and symptoms recognition and intervention leading to self-care maintenance.    Hypertension  Yes    Intervention  Provide education on lifestyle modifcations including regular physical activity/exercise, weight management, moderate sodium restriction and increased consumption of fresh fruit, vegetables, and low fat dairy, alcohol moderation, and smoking cessation.;Monitor prescription use compliance.    Expected Outcomes  Short Term: Continued assessment and intervention until BP is < 140/4390mm HG in hypertensive participants. < 130/580mm HG in hypertensive participants with diabetes, heart failure or chronic kidney disease.;Long Term: Maintenance of blood pressure at goal levels.    Stress  Yes Trying to get back to work at [employer] part time. He encountered issues with his retirement when he retired early in life, so financial issues ar ebecoming a stressor.     Intervention  Offer individual and/or small group education and counseling on adjustment to heart disease, stress management and health-related lifestyle change. Teach and support self-help strategies.;Refer participants experiencing significant psychosocial distress to appropriate mental health specialists for further evaluation and treatment. When possible, include family members and significant others in education/counseling sessions.    Expected Outcomes  Short Term: Participant demonstrates changes in health-related behavior, relaxation  and other stress management skills, ability to obtain effective social support, and compliance with psychotropic medications if prescribed.;Long Term: Emotional wellbeing is indicated by absence of clinically significant psychosocial distress or social isolation.       Tobacco Use Initial Evaluation: Social History   Tobacco Use  Smoking Status Former Smoker  . Types: Cigarettes  . Last attempt to quit: 08/31/1984  . Years since quitting: 32.8  Smokeless Tobacco Never Used    Exercise Goals and Review: Exercise Goals    Row Name 06/27/17 1423             Exercise Goals   Increase Physical Activity  Yes       Intervention  Provide advice, education, support and counseling about physical activity/exercise needs.;Develop an individualized exercise prescription for aerobic and resistive training based on initial evaluation findings, risk stratification, comorbidities and participant's personal goals.       Expected Outcomes  Achievement of increased cardiorespiratory fitness and enhanced flexibility, muscular endurance and strength shown through measurements of functional capacity and personal statement of participant.       Increase Strength and Stamina  Yes       Intervention  Provide advice, education, support and counseling about physical activity/exercise needs.;Develop an individualized exercise prescription for aerobic and resistive training based on initial evaluation findings, risk stratification, comorbidities and participant's personal goals.       Expected Outcomes  Achievement of increased cardiorespiratory fitness and enhanced flexibility, muscular endurance and strength shown through measurements of functional capacity and personal statement of participant.       Able to understand and use rate of perceived exertion (RPE) scale  Yes       Intervention  Provide education and explanation on how to use RPE scale       Expected Outcomes  Short Term: Able to use RPE daily in  rehab to express subjective intensity level;Long Term:  Able to use RPE to guide intensity level when exercising independently       Able to understand and use Dyspnea scale  Yes       Intervention  Provide education and explanation on how to use Dyspnea scale       Expected Outcomes  Short Term: Able to use Dyspnea scale daily in rehab to express subjective sense of shortness of breath during exertion;Long Term: Able to use Dyspnea scale to guide intensity level when exercising independently       Knowledge and understanding of Target Heart Rate Range (THRR)  Yes       Intervention  Provide education and explanation of THRR including how the numbers were predicted and where they are located for reference       Expected Outcomes  Short Term: Able to state/look up THRR;Short Term: Able to use daily as guideline for intensity in rehab;Long Term: Able to use THRR to govern intensity when exercising independently       Able to check pulse independently  Yes       Intervention  Review the importance of being able to check your own pulse for safety during independent exercise;Provide education and demonstration on how to check pulse in carotid and radial arteries.       Expected Outcomes  Short Term: Able to explain why pulse checking is important during independent exercise;Long Term: Able to check pulse independently and accurately       Understanding of Exercise Prescription  Yes       Intervention  Provide education, explanation, and written materials on patient's individual exercise prescription       Expected Outcomes  Short Term: Able to explain program exercise prescription;Long Term: Able to explain home exercise prescription to exercise independently          Copy of goals given to participant.

## 2017-07-01 ENCOUNTER — Encounter: Payer: Medicare Other | Admitting: *Deleted

## 2017-07-01 DIAGNOSIS — I5022 Chronic systolic (congestive) heart failure: Secondary | ICD-10-CM | POA: Diagnosis not present

## 2017-07-01 NOTE — Progress Notes (Signed)
Daily Session Note  Patient Details  Name: Justin Mendez MRN: [id number] Date of Birth: 1932/07/20 Referring Provider:     Cardiac Rehab from 06/27/2017 in [facility] Cardiac and Pulmonary Rehab  Referring Provider  Sabra Heck      Encounter Date: 07/01/2017  Check In: Session Check In - 07/01/17 1624      Check-In   Location  [facility]-Cardiac & Pulmonary Rehab    Staff Present  Earlean Shawl, BS, ACSM CEP, Exercise Physiologist;Carroll Enterkin, RN, BSN;Other    Supervising physician immediately available to respond to emergencies  See telemetry face sheet for immediately available ER MD    Medication changes reported      No    Fall or balance concerns reported     No    Warm-up and Cool-down  Performed on first and last piece of equipment    Resistance Training Performed  Yes    VAD Patient?  No      Pain Assessment   Currently in Pain?  No/denies    Multiple Pain Sites  No          Social History   Tobacco Use  Smoking Status Former Smoker  . Types: Cigarettes  . Last attempt to quit: 08/31/1984  . Years since quitting: 32.8  Smokeless Tobacco Never Used    Goals Met:  Exercise tolerated well Personal goals reviewed No report of cardiac concerns or symptoms Strength training completed today  Goals Unmet:  Not Applicable  Comments: First full day of exercise!  Patient was oriented to gym and equipment including functions, settings, policies, and procedures.  Patient's individual exercise prescription and treatment plan were reviewed.  All starting workloads were established based on the results of the 6 minute walk test done at initial orientation visit.  The plan for exercise progression was also introduced and progression will be customized based on patient's performance and goals.    Dr. Emily Filbert is Medical Director for [place] and [facility].

## 2017-07-03 ENCOUNTER — Encounter: Payer: Medicare Other | Admitting: *Deleted

## 2017-07-03 DIAGNOSIS — I5022 Chronic systolic (congestive) heart failure: Secondary | ICD-10-CM | POA: Diagnosis not present

## 2017-07-03 NOTE — Progress Notes (Signed)
Daily Session Note  Patient Details  Name: Justin Mendez MRN: [id number] Date of Birth: [birth year] Referring Provider:     Cardiac Rehab from 06/27/2017 in [facility] Cardiac and Pulmonary Rehab  Referring Provider  Sabra Heck      Encounter Date: 07/03/2017  Check In: Session Check In - 07/03/17 1633      Check-In   Location  [facility]-Cardiac & Pulmonary Rehab    Staff Present  Renita Papa, RN Vickki Hearing, BA, ACSM CEP, Exercise Physiologist;Carroll Enterkin, RN, BSN    Supervising physician immediately available to respond to emergencies  See telemetry face sheet for immediately available ER MD    Medication changes reported      No    Fall or balance concerns reported     No    Warm-up and Cool-down  Performed on first and last piece of equipment    Resistance Training Performed  Yes    VAD Patient?  No      Pain Assessment   Currently in Pain?  No/denies          Social History   Tobacco Use  Smoking Status Former Smoker  . Types: Cigarettes  . Last attempt to quit: 08/31/1984  . Years since quitting: 32.8  Smokeless Tobacco Never Used    Goals Met:  Independence with exercise equipment Exercise tolerated well No report of cardiac concerns or symptoms Strength training completed today  Goals Unmet:  Not Applicable  Comments: Pt able to follow exercise prescription today without complaint.  Will continue to monitor for progression.    Dr. Emily Filbert is Medical Director for [place] and [facility].

## 2017-07-04 DIAGNOSIS — I5022 Chronic systolic (congestive) heart failure: Secondary | ICD-10-CM

## 2017-07-04 NOTE — Progress Notes (Signed)
Daily Session Note  Patient Details  Name: Justin Mendez MRN: [id number] Date of Birth: 04/07/1933 Referring Provider:     Cardiac Rehab from 06/27/2017 in [facility] Cardiac and Pulmonary Rehab  Referring Provider  Sabra Heck      Encounter Date: 07/04/2017  Check In: Session Check In - 07/04/17 1606      Check-In   Location  [facility]-Cardiac & Pulmonary Rehab    Staff Present  Justin Mend Jaci Carrel, BS, ACSM CEP, Exercise Physiologist;Meredith Sherryll Burger, RN BSN    Supervising physician immediately available to respond to emergencies  See telemetry face sheet for immediately available ER MD    Medication changes reported      No    Fall or balance concerns reported     No    Warm-up and Cool-down  Performed on first and last piece of equipment    Resistance Training Performed  Yes    VAD Patient?  No      Pain Assessment   Currently in Pain?  No/denies          Social History   Tobacco Use  Smoking Status Former Smoker  . Types: Cigarettes  . Last attempt to quit: 08/31/1984  . Years since quitting: 32.8  Smokeless Tobacco Never Used    Goals Met:  Exercise tolerated well No report of cardiac concerns or symptoms Strength training completed today  Goals Unmet:  Not Applicable  Comments: Pt able to follow exercise prescription today without complaint.  Will continue to monitor for progression.   Dr. Emily Filbert is Medical Director for [place] and [facility].

## 2017-07-10 ENCOUNTER — Encounter: Payer: Medicare Other | Admitting: *Deleted

## 2017-07-10 DIAGNOSIS — I5022 Chronic systolic (congestive) heart failure: Secondary | ICD-10-CM

## 2017-07-10 NOTE — Progress Notes (Signed)
Daily Session Note  Patient Details  Name: Justin Mendez MRN: [id number] Date of Birth: [birth year] Referring Provider:     Cardiac Rehab from 06/27/2017 in [facility] Cardiac and Pulmonary Rehab  Referring Provider  Sabra Heck      Encounter Date: 07/10/2017  Check In: Session Check In - 07/10/17 1639      Check-In   Location  [facility]-Cardiac & Pulmonary Rehab    Staff Present  Renita Papa, RN Vickki Hearing, BA, ACSM CEP, Exercise Physiologist;Carroll Enterkin, RN, BSN    Supervising physician immediately available to respond to emergencies  See telemetry face sheet for immediately available ER MD    Medication changes reported      No    Fall or balance concerns reported     No    Warm-up and Cool-down  Performed on first and last piece of equipment    Resistance Training Performed  Yes    VAD Patient?  No      Pain Assessment   Currently in Pain?  No/denies        Exercise Prescription Changes - 07/10/17 1200      Response to Exercise   Blood Pressure (Admit)  122/60    Blood Pressure (Exercise)  130/80    Blood Pressure (Exit)  138/72    Heart Rate (Admit)  81 bpm    Heart Rate (Exercise)  92 bpm    Heart Rate (Exit)  77 bpm    Rating of Perceived Exertion (Exercise)  11    Symptoms  none    Duration  Progress to 45 minutes of aerobic exercise without signs/symptoms of physical distress    Intensity  THRR unchanged      Progression   Progression  Continue to progress workloads to maintain intensity without signs/symptoms of physical distress.    Average METs  2.3      Resistance Training   Training Prescription  Yes    Weight  3 lb    Reps  10-15      Interval Training   Interval Training  No      NuStep   Level  2    SPM  80    Minutes  15    METs  2.3       Social History   Tobacco Use  Smoking Status Former Smoker  . Types: Cigarettes  . Last attempt to quit: 08/31/1984  . Years since quitting: 32.8  Smokeless Tobacco Never Used    Goals  Met:  Independence with exercise equipment Exercise tolerated well No report of cardiac concerns or symptoms Strength training completed today  Goals Unmet:  Not Applicable  Comments: Pt able to follow exercise prescription today without complaint.  Will continue to monitor for progression.    Dr. Emily Filbert is Medical Director for [place] and [facility].

## 2017-07-11 ENCOUNTER — Encounter: Payer: Self-pay | Admitting: *Deleted

## 2017-07-11 DIAGNOSIS — I5022 Chronic systolic (congestive) heart failure: Secondary | ICD-10-CM | POA: Diagnosis not present

## 2017-07-11 NOTE — Progress Notes (Signed)
Daily Session Note  Patient Details  Name: Justin Mendez MRN: [id number] Date of Birth: Sep 17, 1932 Referring Provider:     Cardiac Rehab from 06/27/2017 in [facility] Cardiac and Pulmonary Rehab  Referring Provider  Sabra Heck      Encounter Date: 07/11/2017  Check In: Session Check In - 07/11/17 1613      Check-In   Location  [facility]-Cardiac & Pulmonary Rehab    Staff Present  Renita Papa, RN BSN;Laureen Owens Shark, BS, RRT, Respiratory Therapist;Joseph Flavia Shipper    Supervising physician immediately available to respond to emergencies  See telemetry face sheet for immediately available ER MD    Medication changes reported      No    Fall or balance concerns reported     No    Warm-up and Cool-down  Performed on first and last piece of equipment    Resistance Training Performed  Yes    VAD Patient?  No      Pain Assessment   Currently in Pain?  No/denies          Social History   Tobacco Use  Smoking Status Former Smoker  . Types: Cigarettes  . Last attempt to quit: 08/31/1984  . Years since quitting: 32.8  Smokeless Tobacco Never Used    Goals Met:  Independence with exercise equipment Exercise tolerated well No report of cardiac concerns or symptoms Strength training completed today  Goals Unmet:  Not Applicable  Comments: Pt able to follow exercise prescription today without complaint.  Will continue to monitor for progression.   Dr. Emily Filbert is Medical Director for [place] and [facility].

## 2017-07-17 ENCOUNTER — Encounter: Payer: Medicare Other | Attending: Internal Medicine | Admitting: *Deleted

## 2017-07-17 ENCOUNTER — Ambulatory Visit (INDEPENDENT_AMBULATORY_CARE_PROVIDER_SITE_OTHER): Payer: Medicare Other | Admitting: Vascular Surgery

## 2017-07-17 ENCOUNTER — Encounter (INDEPENDENT_AMBULATORY_CARE_PROVIDER_SITE_OTHER): Payer: Self-pay | Admitting: Vascular Surgery

## 2017-07-17 ENCOUNTER — Encounter: Payer: Self-pay | Admitting: *Deleted

## 2017-07-17 VITALS — BP 110/67 | HR 81 | Resp 16 | Ht 72.0 in | Wt 186.0 lb

## 2017-07-17 DIAGNOSIS — I5022 Chronic systolic (congestive) heart failure: Secondary | ICD-10-CM

## 2017-07-17 DIAGNOSIS — I6523 Occlusion and stenosis of bilateral carotid arteries: Secondary | ICD-10-CM | POA: Diagnosis not present

## 2017-07-17 DIAGNOSIS — I739 Peripheral vascular disease, unspecified: Secondary | ICD-10-CM

## 2017-07-17 DIAGNOSIS — I5023 Acute on chronic systolic (congestive) heart failure: Secondary | ICD-10-CM

## 2017-07-17 DIAGNOSIS — E782 Mixed hyperlipidemia: Secondary | ICD-10-CM | POA: Diagnosis not present

## 2017-07-17 DIAGNOSIS — I10 Essential (primary) hypertension: Secondary | ICD-10-CM | POA: Diagnosis not present

## 2017-07-17 NOTE — Progress Notes (Signed)
Cardiac Individual Treatment Plan  Patient Details  Name: Justin Mendez MRN: [id number] Date of Birth: 02/18/1933 Referring Provider:     Cardiac Rehab from 06/27/2017 in [facility] Cardiac and Pulmonary Rehab  Referring Provider  Sabra Heck      Initial Encounter Date:    Cardiac Rehab from 06/27/2017 in [facility] Cardiac and Pulmonary Rehab  Date  06/27/17  Referring Provider  Sabra Heck      Visit Diagnosis: Heart failure, chronic systolic ([place])  Patient's Home Medications on Admission:  Current Outpatient Medications:  .  aspirin EC 81 MG tablet, Take 81 mg by mouth daily., Disp: , Rfl:  .  atorvastatin (LIPITOR) 80 MG tablet, , Disp: , Rfl: 2 .  cilostazol (PLETAL) 100 MG tablet, Take 100 mg by mouth 2 (two) times daily., Disp: , Rfl:  .  Cinnamon 500 MG capsule, Take 500 mg by mouth daily. , Disp: , Rfl:  .  clopidogrel (PLAVIX) 75 MG tablet, , Disp: , Rfl: 2 .  Co-Enzyme Q-10 30 MG CAPS, Take by mouth daily. , Disp: , Rfl:  .  diltiazem (CARDIZEM CD) 240 MG 24 hr capsule, Take 1 capsule (240 mg total) by mouth daily. (Patient not taking: Reported on 06/27/2017), Disp: 30 capsule, Rfl: 0 .  ELIQUIS 5 MG TABS tablet, , Disp: , Rfl: 11 .  fluticasone (FLONASE) 50 MCG/ACT nasal spray, SHAKE LQ AND U 1 SPR IEN Q 12 H, Disp: , Rfl: 2 .  furosemide (LASIX) 20 MG tablet, Take 1 tablet (20 mg total) by mouth 2 (two) times daily., Disp: 60 tablet, Rfl: 0 .  heparin 100-0.45 UNIT/ML-% infusion, Inject 1,300 Units/hr into the vein continuous. (Patient not taking: Reported on 06/27/2017), Disp: 250 mL, Rfl: 0 .  isosorbide mononitrate (IMDUR) 30 MG 24 hr tablet, , Disp: , Rfl: 2 .  losartan (COZAAR) 25 MG tablet, Take 25 mg by mouth daily., Disp: , Rfl: 3 .  metoprolol succinate (TOPROL-XL) 50 MG 24 hr tablet, Take 1 tablet (50 mg total) by mouth daily. Take with or immediately following a meal., Disp: 30 tablet, Rfl: 0 .  metoprolol succinate (TOPROL-XL) 50 MG 24 hr tablet, Take by mouth., Disp: , Rfl:   .  niacin (NIASPAN) 1000 MG CR tablet, Take 1,000 mg by mouth daily., Disp: , Rfl:  .  nitroGLYCERIN (NITROSTAT) 0.4 MG SL tablet, DIS 1 T UNT FOR CHEST PAIN UTD. MAY TAKE UP TO 3 DOSES, Disp: , Rfl: 0 .  pantoprazole (PROTONIX) 40 MG tablet, Take 40 mg by mouth daily., Disp: , Rfl: 2 .  potassium chloride SA (K-DUR,KLOR-CON) 20 MEQ tablet, Take 2 tablets (40 mEq total) by mouth daily. (Patient not taking: Reported on 06/27/2017), Disp: 30 tablet, Rfl: 0 .  sertraline (ZOLOFT) 25 MG tablet, , Disp: , Rfl: 2 .  spironolactone (ALDACTONE) 25 MG tablet, , Disp: , Rfl: 2 .  traMADol (ULTRAM) 50 MG tablet, Take by mouth every 8 (eight) hours as needed., Disp: , Rfl:   Past Medical History: Past Medical History:  Diagnosis Date  . H/O carotid endarterectomy   . Hearing loss   . High cholesterol   . Hypertension   . Neuropathy   . PAD (peripheral artery disease) (HCC)     Tobacco Use: Social History   Tobacco Use  Smoking Status Former Smoker  . Types: Cigarettes  . Last attempt to quit: 08/31/1984  . Years since quitting: 32.8  Smokeless Tobacco Never Used    Labs: Recent Review Scientist, physiological  There is no flowsheet data to display.       Exercise Target Goals:    Exercise Program Goal: Individual exercise prescription set with THRR, safety & activity barriers. Participant demonstrates ability to understand and report RPE using BORG scale, to self-measure pulse accurately, and to acknowledge the importance of the exercise prescription.  Exercise Prescription Goal: Starting with aerobic activity 30 plus minutes a day, 3 days per week for initial exercise prescription. Provide home exercise prescription and guidelines that participant acknowledges understanding prior to discharge.  Activity Barriers & Risk Stratification: Activity Barriers & Cardiac Risk Stratification - 06/27/17 1415      Activity Barriers & Cardiac Risk Stratification   Activity Barriers  Left Knee  Replacement;Right Knee Replacement    Cardiac Risk Stratification  High       6 Minute Walk: 6 Minute Walk    Row Name 06/27/17 1424         6 Minute Walk   Distance  1088 feet     Walk Time  6 minutes     # of Rest Breaks  0     MPH  2.06     METS  2.56     RPE  14     Perceived Dyspnea   1     VO2 Peak  8.98     Symptoms  No     Resting HR  68 bpm     Resting BP  114/66     Resting Oxygen Saturation   97 %     Exercise Oxygen Saturation  during 6 min walk  99 %     Max Ex. HR  119 bpm     Max Ex. BP  154/72     2 Minute Post BP  128/70        Oxygen Initial Assessment:   Oxygen Re-Evaluation:   Oxygen Discharge (Final Oxygen Re-Evaluation):   Initial Exercise Prescription: Initial Exercise Prescription - 06/27/17 1400      Date of Initial Exercise RX and Referring Provider   Date  06/27/17    Referring Provider  Sabra Heck      Treadmill   MPH  2    Grade  0    Minutes  15    METs  2.53      Recumbant Bike   Level  1    RPM  60    Watts  15    Minutes  15    METs  2.5      NuStep   Level  2    SPM  80    Minutes  15    METs  2.5      Prescription Details   Frequency (times per week)  3    Duration  Progress to 45 minutes of aerobic exercise without signs/symptoms of physical distress      Intensity   THRR 40-80% of Max Heartrate  96-124    Ratings of Perceived Exertion  11-13    Perceived Dyspnea  0-4      Resistance Training   Training Prescription  Yes    Weight  3 lb    Reps  10-15       Perform Capillary Blood Glucose checks as needed.  Exercise Prescription Changes: Exercise Prescription Changes    Row Name 06/27/17 1400 07/10/17 1200           Response to Exercise   Blood Pressure (Admit)  114/66  122/60  Blood Pressure (Exercise)  154/72  130/80      Blood Pressure (Exit)  128/70  138/72      Heart Rate (Admit)  81 bpm  81 bpm      Heart Rate (Exercise)  119 bpm  92 bpm      Heart Rate (Exit)  82 bpm  77 bpm       Oxygen Saturation (Admit)  97 %  -      Oxygen Saturation (Exit)  99 %  -      Rating of Perceived Exertion (Exercise)  14  11      Symptoms  -  none      Duration  -  Progress to 45 minutes of aerobic exercise without signs/symptoms of physical distress      Intensity  -  THRR unchanged        Progression   Progression  -  Continue to progress workloads to maintain intensity without signs/symptoms of physical distress.      Average METs  -  2.3        Resistance Training   Training Prescription  -  Yes      Weight  -  3 lb      Reps  -  10-15        Interval Training   Interval Training  -  No        NuStep   Level  -  2      SPM  -  80      Minutes  -  15      METs  -  2.3         Exercise Comments: Exercise Comments    Row Name 07/01/17 1626           Exercise Comments  First full day of exercise!  Patient was oriented to gym and equipment including functions, settings, policies, and procedures.  Patient's individual exercise prescription and treatment plan were reviewed.  All starting workloads were established based on the results of the 6 minute walk test done at initial orientation visit.  The plan for exercise progression was also introduced and progression will be customized based on patient's performance and goals          Exercise Goals and Review: Exercise Goals    Row Name 06/27/17 1423             Exercise Goals   Increase Physical Activity  Yes       Intervention  Provide advice, education, support and counseling about physical activity/exercise needs.;Develop an individualized exercise prescription for aerobic and resistive training based on initial evaluation findings, risk stratification, comorbidities and participant's personal goals.       Expected Outcomes  Achievement of increased cardiorespiratory fitness and enhanced flexibility, muscular endurance and strength shown through measurements of functional capacity and personal statement of  participant.       Increase Strength and Stamina  Yes       Intervention  Provide advice, education, support and counseling about physical activity/exercise needs.;Develop an individualized exercise prescription for aerobic and resistive training based on initial evaluation findings, risk stratification, comorbidities and participant's personal goals.       Expected Outcomes  Achievement of increased cardiorespiratory fitness and enhanced flexibility, muscular endurance and strength shown through measurements of functional capacity and personal statement of participant.       Able to understand and use rate of perceived exertion (RPE) scale  Yes  Intervention  Provide education and explanation on how to use RPE scale       Expected Outcomes  Short Term: Able to use RPE daily in rehab to express subjective intensity level;Long Term:  Able to use RPE to guide intensity level when exercising independently       Able to understand and use Dyspnea scale  Yes       Intervention  Provide education and explanation on how to use Dyspnea scale       Expected Outcomes  Short Term: Able to use Dyspnea scale daily in rehab to express subjective sense of shortness of breath during exertion;Long Term: Able to use Dyspnea scale to guide intensity level when exercising independently       Knowledge and understanding of Target Heart Rate Range (THRR)  Yes       Intervention  Provide education and explanation of THRR including how the numbers were predicted and where they are located for reference       Expected Outcomes  Short Term: Able to state/look up THRR;Short Term: Able to use daily as guideline for intensity in rehab;Long Term: Able to use THRR to govern intensity when exercising independently       Able to check pulse independently  Yes       Intervention  Review the importance of being able to check your own pulse for safety during independent exercise;Provide education and demonstration on how to check  pulse in carotid and radial arteries.       Expected Outcomes  Short Term: Able to explain why pulse checking is important during independent exercise;Long Term: Able to check pulse independently and accurately       Understanding of Exercise Prescription  Yes       Intervention  Provide education, explanation, and written materials on patient's individual exercise prescription       Expected Outcomes  Short Term: Able to explain program exercise prescription;Long Term: Able to explain home exercise prescription to exercise independently          Exercise Goals Re-Evaluation : Exercise Goals Re-Evaluation    Row Name 07/10/17 1211             Exercise Goal Re-Evaluation   Exercise Goals Review  Increase Physical Activity;Increase Strength and Stamina;Able to understand and use rate of perceived exertion (RPE) scale       Comments  Justin Mendez is tolerating exercise well. Staff wil continue to monitor.       Expected Outcomes  Short - Justin Mendez will continue to attend HT Long- Justin Mendez wil be active on a regular basis and improve overall MET level.          Discharge Exercise Prescription (Final Exercise Prescription Changes): Exercise Prescription Changes - 07/10/17 1200      Response to Exercise   Blood Pressure (Admit)  122/60    Blood Pressure (Exercise)  130/80    Blood Pressure (Exit)  138/72    Heart Rate (Admit)  81 bpm    Heart Rate (Exercise)  92 bpm    Heart Rate (Exit)  77 bpm    Rating of Perceived Exertion (Exercise)  11    Symptoms  none    Duration  Progress to 45 minutes of aerobic exercise without signs/symptoms of physical distress    Intensity  THRR unchanged      Progression   Progression  Continue to progress workloads to maintain intensity without signs/symptoms of physical distress.    Average METs  2.3      Resistance Training   Training Prescription  Yes    Weight  3 lb    Reps  10-15      Interval Training   Interval Training  No      NuStep   Level  2     SPM  80    Minutes  15    METs  2.3       Nutrition:  Target Goals: Understanding of nutrition guidelines, daily intake of sodium <1523m, cholesterol <2061m calories 30% from fat and 7% or less from saturated fats, daily to have 5 or more servings of fruits and vegetables.  Biometrics: Pre Biometrics - 06/27/17 1423      Pre Biometrics   Height  6' 0.5" (1.842 m)    Weight  181 lb 9.6 oz (82.4 kg)    Waist Circumference  38.5 inches    Hip Circumference  41.5 inches    Waist to Hip Ratio  0.93 %    BMI (Calculated)  24.28    Single Leg Stand  1.35 seconds        Nutrition Therapy Plan and Nutrition Goals:   Nutrition Discharge: Rate Your Plate Scores: Nutrition Assessments - 06/27/17 1407      MEDFICTS Scores   Pre Score  63       Nutrition Goals Re-Evaluation:   Nutrition Goals Discharge (Final Nutrition Goals Re-Evaluation):   Psychosocial: Target Goals: Acknowledge presence or absence of significant depression and/or stress, maximize coping skills, provide positive support system. Participant is able to verbalize types and ability to use techniques and skills needed for reducing stress and depression.   Initial Review & Psychosocial Screening: Initial Psych Review & Screening - 06/27/17 1407      Initial Review   Current issues with  Current Stress Concerns    Source of Stress Concerns  Financial;Occupation;Unable to perform yard/household activities    Comments  He is trying to get back to work part time at [employer]He works and so does his wife to help with the financial issues he encountered after his retirement funds went soCreightonue to baMuseum/gallery conservatorHe hates that his wife is having to work, but is looking forward to getting back to his job.       Family Dynamics   Good Support System?  Yes Wife and children      Screening Interventions   Interventions  Yes;Encouraged to exercise    Expected Outcomes  Short Term goal: Utilizing psychosocial  counselor, staff and physician to assist with identification of specific Stressors or current issues interfering with healing process. Setting desired goal for each stressor or current issue identified.;Long Term Goal: Stressors or current issues are controlled or eliminated.;Short Term goal: Identification and review with participant of any Quality of Life or Depression concerns found by scoring the questionnaire.;Long Term goal: The participant improves quality of Life and PHQ9 Scores as seen by post scores and/or verbalization of changes       Quality of Life Scores:  Quality of Life - 06/27/17 1411      Quality of Life Scores   Health/Function Pre  22.29 %    Socioeconomic Pre  30 %    Psych/Spiritual Pre  22.5 %    Family Pre  30 %    GLOBAL Pre  25.42 %       PHQ-9: Recent Review Flowsheet Data    [facility] 06/27/2017   Decreased  Interest 0   Down, Depressed, Hopeless 2   PHQ - 2 Score 2   Altered sleeping 1   Tired, decreased energy 1   Change in appetite 0   Feeling bad or failure about yourself  0   Trouble concentrating 0   Moving slowly or fidgety/restless 0   Suicidal thoughts 0   PHQ-9 Score 4   Difficult doing work/chores Not difficult at all     Interpretation of Total Score  Total Score Depression Severity:  1-4 = Minimal depression, 5-9 = Mild depression, 10-14 = Moderate depression, 15-19 = Moderately severe depression, 20-27 = Severe depression   Psychosocial Evaluation and Intervention: Psychosocial Evaluation - 07/01/17 1724      Psychosocial Evaluation & Interventions   Interventions  Stress management education;Relaxation education;Encouraged to exercise with the program and follow exercise prescription    Comments  Counselor met with Justin Mendez Justin Mendez) and his spouse Otilio Saber today for initial psychosocial evaluation.  He is an 84 year old who was diagnosed with CHF 3 months ago and was told he has only 35% of his heart working currently.   Justin Mendez has a great support system with his spouse of over 67 years and a daughter and son-in-law who live locally.  Justin Mendez has sleep apnea but has been sleeping well with medications and his CPAP for awhile.  He has a "GREAT" appetite but spouse is having to learn how to cook differently to help him eat more healthy.  Justin Mendez has had knee replacment surgery 5 years ago.  He denies a history of depression or anxiety and reports he is typically positive most of the time.  Bette reports he can get irritable at times and he agreed as this latest prognosis with his blockages and being told they are inoperable is discouraging.  Justin Mendez reports his health and his finances are his primary stressors currently.  He has goals to strengthen his heart to 45% functioning - if possible.  Staff will follow with Justin Mendez.    Expected Outcomes  Justin Mendez will benefit from consistent exercise to achieve his stated goals.  The educational and psychoeducational components of this program will be helpful in understanding and coping with his condition in a more positive way.      Continue Psychosocial Services   Follow up required by staff       Psychosocial Re-Evaluation:   Psychosocial Discharge (Final Psychosocial Re-Evaluation):   Vocational Rehabilitation: Provide vocational rehab assistance to qualifying candidates.   Vocational Rehab Evaluation & Intervention: Vocational Rehab - 06/27/17 1413      Initial Vocational Rehab Evaluation & Intervention   Assessment shows need for Vocational Rehabilitation  No       Education: Education Goals: Education classes will be provided on a variety of topics geared toward better understanding of heart health and risk factor modification. Participant will state understanding/return demonstration of topics presented as noted by education test scores.  Learning Barriers/Preferences: Learning Barriers/Preferences - 06/27/17 1412      Learning Barriers/Preferences   Learning Barriers   Hearing    Learning Preferences  Individual Instruction;Skilled Demonstration;Written Material       Education Topics: General Nutrition Guidelines/Fats and Fiber: -Group instruction provided by verbal, written material, models and posters to present the general guidelines for heart healthy nutrition. Gives an explanation and review of dietary fats and fiber.   Cardiac Rehab from 07/10/2017 in [facility] Cardiac and Pulmonary Rehab  Date  07/01/17  Educator  PI  Instruction Review Code  1- Verbalizes Understanding      Controlling Sodium/Reading Food Labels: -Group verbal and written material supporting the discussion of sodium use in heart healthy nutrition. Review and explanation with models, verbal and written materials for utilization of the food label.   Cardiac Rehab from 07/10/2017 in [facility] Cardiac and Pulmonary Rehab  Date  07/01/17  Educator  PI  Instruction Review Code  1- Verbalizes Understanding      Exercise Physiology & Risk Factors: - Group verbal and written instruction with models to review the exercise physiology of the cardiovascular system and associated critical values. Details cardiovascular disease risk factors and the goals associated with each risk factor.   Cardiac Rehab from 07/10/2017 in [facility] Cardiac and Pulmonary Rehab  Date  07/10/17  Educator  AS  Instruction Review Code  1- Verbalizes Understanding      Aerobic Exercise & Resistance Training: - Gives group verbal and written discussion on the health impact of inactivity. On the components of aerobic and resistive training programs and the benefits of this training and how to safely progress through these programs.   Flexibility, Balance, General Exercise Guidelines: - Provides group verbal and written instruction on the benefits of flexibility and balance training programs. Provides general exercise guidelines with specific guidelines to those with heart or lung disease. Demonstration and skill practice  provided.   Stress Management: - Provides group verbal and written instruction about the health risks of elevated stress, cause of high stress, and healthy ways to reduce stress.   Depression: - Provides group verbal and written instruction on the correlation between heart/lung disease and depressed mood, treatment options, and the stigmas associated with seeking treatment.   Anatomy & Physiology of the Heart: - Group verbal and written instruction and models provide basic cardiac anatomy and physiology, with the coronary electrical and arterial systems. Review of: AMI, Angina, Valve disease, Heart Failure, Cardiac Arrhythmia, Pacemakers, and the ICD.   Cardiac Procedures: - Group verbal and written instruction to review commonly prescribed medications for heart disease. Reviews the medication, class of the drug, and side effects. Includes the steps to properly store meds and maintain the prescription regimen. (beta blockers and nitrates)   Cardiac Medications I: - Group verbal and written instruction to review commonly prescribed medications for heart disease. Reviews the medication, class of the drug, and side effects. Includes the steps to properly store meds and maintain the prescription regimen.   Cardiac Medications II: -Group verbal and written instruction to review commonly prescribed medications for heart disease. Reviews the medication, class of the drug, and side effects. (all other drug classes)    Go Sex-Intimacy & Heart Disease, Get SMART - Goal Setting: - Group verbal and written instruction through game format to discuss heart disease and the return to sexual intimacy. Provides group verbal and written material to discuss and apply goal setting through the application of the S.M.A.R.T. Method.   Other Matters of the Heart: - Provides group verbal, written materials and models to describe Heart Failure, Angina, Valve Disease, Peripheral Artery Disease, and Diabetes in  the realm of heart disease. Includes description of the disease process and treatment options available to the cardiac patient.   Exercise & Equipment Safety: - Individual verbal instruction and demonstration of equipment use and safety with use of the equipment.   Cardiac Rehab from 07/10/2017 in [facility] Cardiac and Pulmonary Rehab  Date  06/27/17  Educator  [facility]  Instruction Review Code  1- Verbalizes Understanding  Infection Prevention: - Provides verbal and written material to individual with discussion of infection control including proper hand washing and proper equipment cleaning during exercise session.   Cardiac Rehab from 07/10/2017 in [facility] Cardiac and Pulmonary Rehab  Date  06/27/17  Educator  [facility]  Instruction Review Code  1- Verbalizes Understanding      Falls Prevention: - Provides verbal and written material to individual with discussion of falls prevention and safety.   Cardiac Rehab from 07/10/2017 in [facility] Cardiac and Pulmonary Rehab  Date  06/27/17  Educator  [facility]  Instruction Review Code  1- Verbalizes Understanding      Diabetes: - Individual verbal and written instruction to review signs/symptoms of diabetes, desired ranges of glucose level fasting, after meals and with exercise. Acknowledge that pre and post exercise glucose checks will be done for 3 sessions at entry of program.   Other: -Provides group and verbal instruction on various topics (see comments)    Knowledge Questionnaire Score: Knowledge Questionnaire Score - 06/27/17 1413      Knowledge Questionnaire Score   Pre Score  22/28 correct answers reviewed with patient       Core Components/Risk Factors/Patient Goals at Admission: Personal Goals and Risk Factors at Admission - 06/27/17 1355      Core Components/Risk Factors/Patient Goals on Admission    Weight Management  Weight Maintenance;Yes    Intervention  Weight Management: Develop a combined nutrition and exercise program designed  to reach desired caloric intake, while maintaining appropriate intake of nutrient and fiber, sodium and fats, and appropriate energy expenditure required for the weight goal.;Weight Management: Provide education and appropriate resources to help participant work on and attain dietary goals.;Weight Management/Obesity: Establish reasonable short term and long term weight goals.    Admit Weight  181 lb (82.1 kg) Weighing with clothes on, usually 174-175 at home. Would like to maintain that weight.    Goal Weight: Short Term  176 lb (79.8 kg)    Goal Weight: Long Term  175 lb (79.4 kg)    Expected Outcomes  Short Term: Continue to assess and modify interventions until short term weight is achieved;Long Term: Adherence to nutrition and physical activity/exercise program aimed toward attainment of established weight goal;Weight Maintenance: Understanding of the daily nutrition guidelines, which includes 25-35% calories from fat, 7% or less cal from saturated fats, less than 211m cholesterol, less than 1.5gm of sodium, & 5 or more servings of fruits and vegetables daily;Understanding recommendations for meals to include 15-35% energy as protein, 25-35% energy from fat, 35-60% energy from carbohydrates, less than 2023mof dietary cholesterol, 20-35 gm of total fiber daily;Understanding of distribution of calorie intake throughout the day with the consumption of 4-5 meals/snacks    Heart Failure  Yes    Intervention  Provide a combined exercise and nutrition program that is supplemented with education, support and counseling about heart failure. Directed toward relieving symptoms such as shortness of breath, decreased exercise tolerance, and extremity edema.    Expected Outcomes  Improve functional capacity of life;Short term: Attendance in program 2-3 days a week with increased exercise capacity. Reported lower sodium intake. Reported increased fruit and vegetable intake. Reports medication compliance.;Short term:  Daily weights obtained and reported for increase. Utilizing diuretic protocols set by physician.;Long term: Adoption of self-care skills and reduction of barriers for early signs and symptoms recognition and intervention leading to self-care maintenance.    Hypertension  Yes    Intervention  Provide education on lifestyle modifcations including  regular physical activity/exercise, weight management, moderate sodium restriction and increased consumption of fresh fruit, vegetables, and low fat dairy, alcohol moderation, and smoking cessation.;Monitor prescription use compliance.    Expected Outcomes  Short Term: Continued assessment and intervention until BP is < 140/65m HG in hypertensive participants. < 130/846mHG in hypertensive participants with diabetes, heart failure or chronic kidney disease.;Long Term: Maintenance of blood pressure at goal levels.    Stress  Yes Trying to get back to work at [employer] time. He encountered issues with his retirement when he retired early in life, so financial issues ar ebecoming a stressor.     Intervention  Offer individual and/or small group education and counseling on adjustment to heart disease, stress management and health-related lifestyle change. Teach and support self-help strategies.;Refer participants experiencing significant psychosocial distress to appropriate mental health specialists for further evaluation and treatment. When possible, include family members and significant others in education/counseling sessions.    Expected Outcomes  Short Term: Participant demonstrates changes in health-related behavior, relaxation and other stress management skills, ability to obtain effective social support, and compliance with psychotropic medications if prescribed.;Long Term: Emotional wellbeing is indicated by absence of clinically significant psychosocial distress or social isolation.       Core Components/Risk Factors/Patient Goals Review:    Core  Components/Risk Factors/Patient Goals at Discharge (Final Review):    ITP Comments: ITP Comments    Row Name 06/27/17 1338 07/11/17 0712 07/17/17 1022       ITP Comments  Med Review completed. Initial ITP created. [facility]   MiKathleene Hazelge 8262ast evening in Cardiac Rehab had multiple PVCs. He said he was more tired than usual yesterday during the day. The only caffeine he had was hot tea. MiCaesoneported that you started him on Magnesium tablets about 4 days ago. Reported to his primary care MD Dr. MiSabra Hecky fax [facility]. Continue with ITP unless directed changes per Medical Director review.  New to program        Comments:

## 2017-07-17 NOTE — Progress Notes (Signed)
Subjective:    Patient ID: Justin Mendez, male    DOB: 1932/10/30, 84 y.o.   MRN: [id number] Chief Complaint  Patient presents with  . New Patient (Initial Visit)    Carotid stenosis   Presents as a new patient transferring his medical care from [facility].  The patient was seeing a physician at [facility] who has since retired - he is looking to establish care with our practice.  The patient was being treated for carotid artery and peripheral artery disease.  The patient states he was being seen every 6 months and undergoing ultrasounds for both his carotid and peripheral artery disease.  The patient is status post a right carotid endarterectomy in 2003.  The patient presents today without complaint.  The patient denies experiencing Amaurosis Fugax, TIA like symptoms or focal motor deficits. The patient denies any claudication like symptoms, rest pain or ulcers to his feet / toes.  The patient was recently diagnosed with worsening heart failure and is currently in cardiac rehab. The patient denies any fever, nausea or vomiting.   Review of Systems  Constitutional: Negative.   HENT: Negative.   Eyes: Negative.   Respiratory: Negative.   Cardiovascular: Negative.   Gastrointestinal: Negative.   Endocrine: Negative.   Genitourinary: Negative.   Musculoskeletal: Negative.   Skin: Negative.   Allergic/Immunologic: Negative.   Neurological: Negative.   Hematological: Negative.   Psychiatric/Behavioral: Negative.       Objective:   Physical Exam  Constitutional: He is oriented to person, place, and time. He appears well-developed and well-nourished. No distress.  HENT:  Head: Normocephalic and atraumatic.  Eyes: Conjunctivae are normal. Pupils are equal, round, and reactive to light.  Neck: Normal range of motion.  No carotid bruits noted  Cardiovascular: Normal rate, regular rhythm, normal heart sounds and intact distal pulses.  Pulses:      Radial pulses are 2+ on the right  side, and 2+ on the left side.       Dorsalis pedis pulses are 1+ on the right side, and 1+ on the left side.       Posterior tibial pulses are 1+ on the right side, and 1+ on the left side.  Pulmonary/Chest: Effort normal and breath sounds normal.  Musculoskeletal: Normal range of motion. He exhibits no edema.  Neurological: He is alert and oriented to person, place, and time.  Skin: Skin is warm and dry. He is not diaphoretic.  Psychiatric: He has a normal mood and affect. His behavior is normal. Judgment and thought content normal.  Vitals reviewed.  BP 110/67 (BP Location: Right Arm, Patient Position: Sitting)   Pulse 81   Resp 16   Ht 6' (1.829 m)   Wt 186 lb (84.4 kg)   BMI 25.23 kg/m   Past Medical History:  Diagnosis Date  . H/O carotid endarterectomy   . Hearing loss   . High cholesterol   . Hypertension   . Neuropathy   . PAD (peripheral artery disease) (HCC)    Social History   Socioeconomic History  . Marital status: Married    Spouse name: Not on file  . Number of children: Not on file  . Years of education: Not on file  . Highest education level: Not on file  Social Needs  . Financial resource strain: Not on file  . Food insecurity - worry: Not on file  . Food insecurity - inability: Not on file  . Transportation needs - medical: Not on  file  . Transportation needs - non-medical: Not on file  Occupational History  . Not on file  Tobacco Use  . Smoking status: Former Smoker    Types: Cigarettes    Last attempt to quit: 08/31/1984    Years since quitting: 32.8  . Smokeless tobacco: Never Used  Substance and Sexual Activity  . Alcohol use: No  . Drug use: No  . Sexual activity: Not on file  Other Topics Concern  . Not on file  Social History Narrative  . Not on file   Past Surgical History:  Procedure Laterality Date  . carotid endarderectomy     Bilateral  . KNEE SURGERY     Left  . NECK SURGERY    . RIGHT/LEFT HEART CATH AND CORONARY  ANGIOGRAPHY N/A 04/29/2017   Procedure: RIGHT/LEFT HEART CATH AND CORONARY ANGIOGRAPHY;  Surgeon: Marcina Millard, MD;  Location: [facility] INVASIVE CV LAB;  Service: Cardiovascular;  Laterality: N/A;   Family History  Problem Relation Age of Onset  . Lung cancer Father    Allergies  Allergen Reactions  . Percocet [Oxycodone-Acetaminophen] Other (See Comments)    Confusion/hallucinations  . Iodinated Diagnostic Agents Other (See Comments)     Was very "flushed", denies itching, swelling or hives. He states this was over 30 years ago with Ionic contrast and not the current non-ionic contrast. SPM      Assessment & Plan:  Presents as a new patient transferring his medical care from [facility].  The patient was seeing a physician at [facility] who has since retired - he is looking to establish care with our practice.  The patient was being treated for carotid artery and peripheral artery disease.  The patient states he was being seen every 6 months and undergoing ultrasounds for both his carotid and peripheral artery disease.  The patient is status post a right carotid endarterectomy in 2003.  The patient presents today without complaint.  The patient denies experiencing Amaurosis Fugax, TIA like symptoms or focal motor deficits. The patient denies any claudication like symptoms, rest pain or ulcers to his feet / toes.  The patient was recently diagnosed with worsening heart failure and is currently in cardiac rehab. The patient denies any fever, nausea or vomiting.  1. Acute on chronic systolic congestive heart failure (HCC) - Stable This has recently worsened. The patient is currently undergoing cardiac rehab  2. Peripheral arterial disease (HCC) - Stable The patient has a past medical history of peripheral artery disease The patient was being followed by a vascular surgeon from [facility] who has since retired The patient presents today asymptomatically I will order an ABI for a baseline  test I have discussed with the patient at length the risk factors for and pathogenesis of atherosclerotic disease and encouraged a healthy diet, regular exercise regimen and blood pressure / glucose control.  The patient was encouraged to call the office in the interim if he experiences any claudication like symptoms, rest pain or ulcers to his feet / toes.  - VAS [place] ABI WITH/WO TBI; Future  3. Bilateral carotid artery stenosis - Stable The patient has a past medical history of carotid artery stenosis The patient had a right carotid endarterectomy in 2003 The patient presents asymptomatically I will order a carotid duplex exam for a baseline I have discussed with the patient at length the risk factors for and pathogenesis of atherosclerotic disease and encouraged a healthy diet, regular exercise regimen and blood pressure / glucose control.  Patient was instructed to contact our office in the interim with problems such as arm / leg weakness or numbness, speech / swallowing difficulty or temporary monocular blindness. The patient expresses their understanding.   - VAS US CAROTID; Future  4. Combined hyperlipidemia - Stable Encouraged good control as its slows the progression of atherosclerotic disease  5. Essential hypertension - Stable Encouraged good control as its slows the progression of atherosclerotic disease  Current Outpatient Medications on File Prior to Visit  Medication Sig Dispense Refill  . aspirin EC 81 MG tablet Take 81 mg by mouth daily.    Marland Kitchen. atorvastatin (LIPITOR) 80 MG tablet   2  . cilostazol (PLETAL) 100 MG tablet Take 100 mg by mouth 2 (two) times daily.    . Cinnamon 500 MG capsule Take 500 mg by mouth daily.     . clopidogrel (PLAVIX) 75 MG tablet   2  . Co-Enzyme Q-10 30 MG CAPS Take by mouth daily.     Marland Kitchen. ELIQUIS 5 MG TABS tablet   11  . fluticasone (FLONASE) 50 MCG/ACT nasal spray SHAKE LQ AND U 1 SPR IEN Q 12 H  2  . furosemide (LASIX) 20 MG tablet Take 1  tablet (20 mg total) by mouth 2 (two) times daily. 60 tablet 0  . isosorbide mononitrate (IMDUR) 30 MG 24 hr tablet   2  . losartan (COZAAR) 25 MG tablet Take 25 mg by mouth daily.  3  . magnesium oxide (MAG-OX) 400 MG tablet Take by mouth.    . metoprolol succinate (TOPROL-XL) 50 MG 24 hr tablet Take 1 tablet (50 mg total) by mouth daily. Take with or immediately following a meal. 30 tablet 0  . metoprolol succinate (TOPROL-XL) 50 MG 24 hr tablet Take by mouth.    . Multiple Vitamin (MULTI-VITAMINS) TABS Take by mouth.    . niacin (NIASPAN) 1000 MG CR tablet Take 1,000 mg by mouth daily.    . nitroGLYCERIN (NITROSTAT) 0.4 MG SL tablet DIS 1 T UNT FOR CHEST PAIN UTD. MAY TAKE UP TO 3 DOSES  0  . pantoprazole (PROTONIX) 40 MG tablet Take 40 mg by mouth daily.  2  . sertraline (ZOLOFT) 25 MG tablet   2  . spironolactone (ALDACTONE) 25 MG tablet   2  . traMADol (ULTRAM) 50 MG tablet Take by mouth every 8 (eight) hours as needed.    . diltiazem (CARDIZEM CD) 240 MG 24 hr capsule Take 1 capsule (240 mg total) by mouth daily. (Patient not taking: Reported on 06/27/2017) 30 capsule 0  . potassium chloride SA (K-DUR,KLOR-CON) 20 MEQ tablet Take 2 tablets (40 mEq total) by mouth daily. (Patient not taking: Reported on 06/27/2017) 30 tablet 0   No current facility-administered medications on file prior to visit.    There are no Patient Instructions on file for this visit. No Follow-up on file.  Anthany Thornhill A Lynore Coscia, PA-C

## 2017-07-17 NOTE — Progress Notes (Signed)
Daily Session Note  Patient Details  Name: Aashrith Eves MRN: [id number] Date of Birth: 08/05/1932 Referring Provider:     Cardiac Rehab from 06/27/2017 in [facility] Cardiac and Pulmonary Rehab  Referring Provider  Sabra Heck      Encounter Date: 07/17/2017  Check In: Session Check In - 07/17/17 1646      Check-In   Location  [facility]-Cardiac & Pulmonary Rehab    Staff Present  Renita Papa, RN Vickki Hearing, BA, ACSM CEP, Exercise Physiologist;Carroll Enterkin, RN, BSN    Supervising physician immediately available to respond to emergencies  See telemetry face sheet for immediately available ER MD    Medication changes reported      No    Fall or balance concerns reported     No    Warm-up and Cool-down  Performed on first and last piece of equipment    Resistance Training Performed  Yes    VAD Patient?  No      Pain Assessment   Currently in Pain?  No/denies          Social History   Tobacco Use  Smoking Status Former Smoker  . Types: Cigarettes  . Last attempt to quit: 08/31/1984  . Years since quitting: 32.8  Smokeless Tobacco Never Used    Goals Met:  Independence with exercise equipment Exercise tolerated well No report of cardiac concerns or symptoms Strength training completed today  Goals Unmet:  Not Applicable  Comments: Pt able to follow exercise prescription today without complaint.  Will continue to monitor for progression.    Dr. Emily Filbert is Medical Director for [place] and [facility].

## 2017-07-18 DIAGNOSIS — I5022 Chronic systolic (congestive) heart failure: Secondary | ICD-10-CM | POA: Diagnosis not present

## 2017-07-18 NOTE — Progress Notes (Deleted)
Daily Session Note  Patient Details  Name: Justin Mendez MRN: [id number] Date of Birth: 07/05/1933 Referring Provider:     Cardiac Rehab from 06/27/2017 in [facility] Cardiac and Pulmonary Rehab  Referring Provider  Miller      Encounter Date: 07/18/2017  Check In: Session Check In - 07/18/17 1620      Check-In   Location  [facility]-Cardiac & Pulmonary Rehab    Staff Present  Joseph Hood RCP,RRT,BSRT;Amanda Sommer, BA, ACSM CEP, Exercise Physiologist;Meredith Craven, RN BSN    Supervising physician immediately available to respond to emergencies  See telemetry face sheet for immediately available ER MD    Medication changes reported      No    Fall or balance concerns reported     No    Warm-up and Cool-down  Performed on first and last piece of equipment    Resistance Training Performed  Yes    VAD Patient?  No      Pain Assessment   Currently in Pain?  No/denies          Social History   Tobacco Use  Smoking Status Former Smoker  . Types: Cigarettes  . Last attempt to quit: 08/31/1984  . Years since quitting: 32.9  Smokeless Tobacco Never Used    Goals Met:  Independence with exercise equipment Exercise tolerated well No report of cardiac concerns or symptoms Strength training completed today  Goals Unmet:  Not Applicable  Comments: Pt able to follow exercise prescription today without complaint.  Will continue to monitor for progression.   Dr. Mark Miller is Medical Director for [facility] and [facility]. 

## 2017-07-18 NOTE — Progress Notes (Signed)
Daily Session Note  Patient Details  Name: Justin Mendez MRN: [id number] Date of Birth: 12-09-1932 Referring Provider:     Cardiac Rehab from 06/27/2017 in [facility] Cardiac and Pulmonary Rehab  Referring Provider  Sabra Heck      Encounter Date: 07/18/2017  Check In: Session Check In - 07/18/17 1620      Check-In   Location  [facility]Cardiac [facility] Pulmonary Rehab    Staff Present  Justin Mend RCP,RRT,BSRT;Shameek Nyquist Oletta Darter, [place], ACSM CEP, Exercise Physiologist;Meredith Sherryll Burger, RN BSN    Supervising physician immediately available to respond to emergencies  See telemetry face sheet for immediately available ER MD    Medication changes reported      No    Fall or balance concerns reported     No    Warm-up and Cool-down  Performed on first and last piece of equipment    Resistance Training Performed  Yes    VAD Patient?  No      Pain Assessment   Currently in Pain?  No/denies        Exercise Prescription Changes - 07/18/17 1600      Home Exercise Plan   Plans to continue exercise at  [facility] home - walk    Frequency  Add 1 additional day to program exercise sessions.    Initial Home Exercises Provided  07/18/17       Social History   Tobacco Use  Smoking Status Former Smoker  . Types: Cigarettes  . Last attempt to quit: 08/31/1984  . Years since quitting: 32.9  Smokeless Tobacco Never Used    Goals Met:  Independence with exercise equipment Exercise tolerated well No report of cardiac concerns or symptoms Strength training completed today  Goals Unmet:  Not Applicable  Comments: Reviewed home exercise with pt today.  Pt plans to walk and attend [employer] for exercise.  Reviewed THR, pulse, RPE, sign and symptoms, NTG use, and when to call 911 or MD.  Also discussed weather considerations and indoor options.  Pt voiced understanding.    Dr. Emily Filbert is Medical Director for [place] and [facility].

## 2017-07-22 ENCOUNTER — Encounter: Payer: Medicare Other | Admitting: *Deleted

## 2017-07-22 DIAGNOSIS — I5022 Chronic systolic (congestive) heart failure: Secondary | ICD-10-CM | POA: Diagnosis not present

## 2017-07-22 NOTE — Progress Notes (Signed)
Daily Session Note  Patient Details  Name: Camar Guyton MRN: [id number] Date of Birth: [birth year] Referring Provider:     Cardiac Rehab from 06/27/2017 in [facility] Cardiac and Pulmonary Rehab  Referring Provider  Sabra Heck      Encounter Date: 07/22/2017  Check In: Session Check In - 07/22/17 1721      Check-In   Location  [facility]-Cardiac & Pulmonary Rehab    Staff Present  Earlean Shawl, BS, ACSM CEP, Exercise Physiologist;Amanda Oletta Darter, BA, ACSM CEP, Exercise Physiologist;Carroll Enterkin, RN, BSN    Supervising physician immediately available to respond to emergencies  See telemetry face sheet for immediately available ER MD    Medication changes reported      No    Fall or balance concerns reported     No    Warm-up and Cool-down  Performed on first and last piece of equipment    Resistance Training Performed  Yes    VAD Patient?  No      Pain Assessment   Currently in Pain?  No/denies    Multiple Pain Sites  No          Social History   Tobacco Use  Smoking Status Former Smoker  . Types: Cigarettes  . Last attempt to quit: 08/31/1984  . Years since quitting: 32.9  Smokeless Tobacco Never Used    Goals Met:  Independence with exercise equipment Exercise tolerated well No report of cardiac concerns or symptoms Strength training completed today  Goals Unmet:  Not Applicable  Comments: Pt able to follow exercise prescription today without complaint.  Will continue to monitor for progression.    Dr. Emily Filbert is Medical Director for [place] and [facility].

## 2017-07-24 ENCOUNTER — Encounter: Payer: Medicare Other | Admitting: *Deleted

## 2017-07-24 DIAGNOSIS — I5022 Chronic systolic (congestive) heart failure: Secondary | ICD-10-CM

## 2017-07-24 NOTE — Progress Notes (Signed)
Daily Session Note  Patient Details  Name: Justin Mendez MRN: [id number] Date of Birth: 1932-09-28 Referring Provider:     Cardiac Rehab from 06/27/2017 in [facility] Cardiac and Pulmonary Rehab  Referring Provider  Sabra Heck      Encounter Date: 07/24/2017  Check In: Session Check In - 07/24/17 1709      Check-In   Location  [facility]-Cardiac & Pulmonary Rehab    Staff Present  Renita Papa, RN BSN;Carroll Enterkin, RN, Vickki Hearing, BA, ACSM CEP, Exercise Physiologist    Supervising physician immediately available to respond to emergencies  See telemetry face sheet for immediately available ER MD    Medication changes reported      No    Fall or balance concerns reported     No    Warm-up and Cool-down  Performed on first and last piece of equipment    Resistance Training Performed  Yes    VAD Patient?  No      Pain Assessment   Currently in Pain?  No/denies        Exercise Prescription Changes - 07/24/17 1100      Response to Exercise   Blood Pressure (Admit)  112/64    Blood Pressure (Exercise)  130/70    Blood Pressure (Exit)  118/70    Heart Rate (Admit)  67 bpm    Heart Rate (Exercise)  102 bpm    Heart Rate (Exit)  70 bpm    Rating of Perceived Exertion (Exercise)  13    Symptoms  none    Duration  Progress to 45 minutes of aerobic exercise without signs/symptoms of physical distress    Intensity  THRR unchanged      Progression   Progression  Continue to progress workloads to maintain intensity without signs/symptoms of physical distress.    Average METs  2.45      Resistance Training   Training Prescription  Yes    Weight  3 lb    Reps  10-15      Interval Training   Interval Training  No      Recumbant Bike   Level  1    RPM  60    Minutes  15    METs  2.55      NuStep   Level  2    SPM  80    Minutes  15    METs  2.4      Home Exercise Plan   Plans to continue exercise at  [facility] home - walk    Frequency  Add 1 additional day to  program exercise sessions.    Initial Home Exercises Provided  07/18/17       Social History   Tobacco Use  Smoking Status Former Smoker  . Types: Cigarettes  . Last attempt to quit: 08/31/1984  . Years since quitting: 32.9  Smokeless Tobacco Never Used    Goals Met:  Independence with exercise equipment Exercise tolerated well No report of cardiac concerns or symptoms Strength training completed today  Goals Unmet:  Not Applicable  Comments: Pt able to follow exercise prescription today without complaint.  Will continue to monitor for progression.    Dr. Emily Filbert is Medical Director for [name] and [facility].

## 2017-07-25 DIAGNOSIS — I5022 Chronic systolic (congestive) heart failure: Secondary | ICD-10-CM

## 2017-07-25 NOTE — Progress Notes (Signed)
Daily Session Note  Patient Details  Name: Justin Mendez MRN: [id number] Date of Birth: 1932-08-09 Referring Provider:     Cardiac Rehab from 06/27/2017 in [facility] Cardiac and Pulmonary Rehab  Referring Provider  Sabra Heck      Encounter Date: 07/25/2017  Check In: Session Check In - 07/25/17 1643      Check-In   Location  [facility]-Cardiac & Pulmonary Rehab    Staff Present  Nada Maclachlan, BA, ACSM CEP, Exercise Physiologist;Meredith Sherryll Burger, RN BSN;Kamalani Mastro Tedd Sias, [place], ACSM CEP, Exercise Physiologist    Supervising physician immediately available to respond to emergencies  See telemetry face sheet for immediately available ER MD    Medication changes reported      No    Fall or balance concerns reported     No    Warm-up and Cool-down  Performed on first and last piece of equipment    Resistance Training Performed  Yes    VAD Patient?  No      Pain Assessment   Currently in Pain?  No/denies    Multiple Pain Sites  No          Social History   Tobacco Use  Smoking Status Former Smoker  . Types: Cigarettes  . Last attempt to quit: 08/31/1984  . Years since quitting: 32.9  Smokeless Tobacco Never Used    Goals Met:  Independence with exercise equipment Exercise tolerated well No report of cardiac concerns or symptoms Strength training completed today  Goals Unmet:  Not Applicable  Comments: Pt able to follow exercise prescription today without complaint.  Will continue to monitor for progression.   Dr. Emily Filbert is Medical Director for [place] and [facility].

## 2017-07-29 ENCOUNTER — Telehealth: Payer: Self-pay | Admitting: *Deleted

## 2017-07-29 DIAGNOSIS — I5022 Chronic systolic (congestive) heart failure: Secondary | ICD-10-CM

## 2017-07-29 NOTE — Telephone Encounter (Signed)
I left vm to check on Justin Mendez and discuss his Cardiac Rehab goals.

## 2017-07-29 NOTE — Progress Notes (Signed)
Daily Session Note  Patient Details  Name: Justin Mendez MRN: [id number] Date of Birth: 09-21-1932 Referring Provider:     Cardiac Rehab from 06/27/2017 in [facility] Cardiac and Pulmonary Rehab  Referring Provider  Sabra Heck      Encounter Date: 07/29/2017  Check In: Session Check In - 07/29/17 1717      Check-In   Location  [facility]Cardiac & Pulmonary Rehab    Staff Present  Earlean Shawl, BS, ACSM CEP, Exercise Physiologist;Amanda Oletta Darter, BA, ACSM CEP, Exercise Physiologist;Carroll Enterkin, RN, BSN    Supervising physician immediately available to respond to emergencies  See telemetry face sheet for immediately available ER MD    Medication changes reported      No    Fall or balance concerns reported     No    Warm-up and Cool-down  Performed on first and last piece of equipment    Resistance Training Performed  Yes    VAD Patient?  No      Pain Assessment   Currently in Pain?  No/denies    Multiple Pain Sites  No          Social History   Tobacco Use  Smoking Status Former Smoker  . Types: Cigarettes  . Last attempt to quit: 08/31/1984  . Years since quitting: 32.9  Smokeless Tobacco Never Used    Goals Met:  Independence with exercise equipment Exercise tolerated well No report of cardiac concerns or symptoms Strength training completed today  Goals Unmet:  Not Applicable  Comments: Pt able to follow exercise prescription today without complaint.  Will continue to monitor for progression.    Dr. Emily Filbert is Medical Director for [place] and [facility].

## 2017-07-30 ENCOUNTER — Telehealth: Payer: Self-pay | Admitting: *Deleted

## 2017-07-30 NOTE — Telephone Encounter (Signed)
I spoke to America BrownMichael Dimalanta and explained to him that [employer] does not pay for any type of home health services and outpatient Cardiac Rehab at the same time. I explained to him that Medicare feels if people can get out for 2-3 times/week of Cardiac Rehab that they should not have to pay for both type of services. I offered to print Ansh's weights in lbs that he has done each time he has been in Cardiac Rehab and all his blood pressures.

## 2017-07-31 ENCOUNTER — Encounter: Payer: Self-pay | Admitting: *Deleted

## 2017-07-31 ENCOUNTER — Encounter: Payer: Medicare Other | Admitting: *Deleted

## 2017-07-31 DIAGNOSIS — I5022 Chronic systolic (congestive) heart failure: Secondary | ICD-10-CM

## 2017-07-31 NOTE — Progress Notes (Signed)
Daily Session Note  Patient Details  Name: Justin Mendez MRN: [id number] Date of Birth: [birth year] Referring Provider:     Cardiac Rehab from 06/27/2017 in [facility] Cardiac and Pulmonary Rehab  Referring Provider  Sabra Heck      Encounter Date: 07/31/2017  Check In: Session Check In - 07/31/17 1714      Check-In   Location  [facility]Cardiac & Pulmonary Rehab    Staff Present  Renita Papa, RN Vickki Hearing, BA, ACSM CEP, Exercise Physiologist;Carroll Enterkin, RN, BSN    Supervising physician immediately available to respond to emergencies  See telemetry face sheet for immediately available ER MD    Medication changes reported      No    Fall or balance concerns reported     No    Warm-up and Cool-down  Performed on first and last piece of equipment    Resistance Training Performed  Yes    VAD Patient?  No      Pain Assessment   Currently in Pain?  No/denies          Social History   Tobacco Use  Smoking Status Former Smoker  . Types: Cigarettes  . Last attempt to quit: 08/31/1984  . Years since quitting: 32.9  Smokeless Tobacco Never Used    Goals Met:  Independence with exercise equipment Exercise tolerated well No report of cardiac concerns or symptoms Strength training completed today  Goals Unmet:  Not Applicable  Comments: Pt able to follow exercise prescription today without complaint.  Will continue to monitor for progression.    Dr. Emily Filbert is Medical Director for [place] and [facility].

## 2017-08-01 ENCOUNTER — Encounter: Payer: Self-pay | Admitting: Dietician

## 2017-08-01 DIAGNOSIS — I5022 Chronic systolic (congestive) heart failure: Secondary | ICD-10-CM

## 2017-08-01 NOTE — Progress Notes (Signed)
Daily Session Note  Patient Details  Name: Sigismund Cross MRN: [id number] Date of Birth: [birth year] Referring Provider:     Cardiac Rehab from 06/27/2017 in [facility] Cardiac and Pulmonary Rehab  Referring Provider  Sabra Heck      Encounter Date: 08/01/2017  Check In: Session Check In - 08/01/17 1616      Check-In   Location  [facility]Cardiac [facility]monary Re[facility]    Staff Present  Justin Mend Jaci Carrel, BS, ACSM CEP, Exercise Physiologist;Meredith Sherryll Burger, RN BSN    Supervising physician immediately available to respond to emergencies  See telemetry face sheet for immediately available ER MD    Medication changes reported      No    Fall or balance concerns reported     No    Warm-up and Cool-down  Performed on first and last piece of equipment    Resistance Training Performed  Yes    VAD Patient?  No      Pain Assessment   Currently in Pain?  No/denies          Social History   Tobacco Use  Smoking Status Former Smoker  . Types: Cigarettes  . Last attempt to quit: 08/31/1984  . Years since quitting: 32.9  Smokeless Tobacco Never Used    Goals Met:  Independence with exercise equipment Exercise tolerated well No report of cardiac concerns or symptoms Strength training completed today  Goals Unmet:  Not Applicable  Comments: Pt able to follow exercise prescription today without complaint.  Will continue to monitor for progression.   Dr. Emily Filbert is Medical Director for [place] and [facility].

## 2017-08-05 DIAGNOSIS — I5022 Chronic systolic (congestive) heart failure: Secondary | ICD-10-CM | POA: Diagnosis not present

## 2017-08-05 NOTE — Progress Notes (Signed)
Daily Session Note  Patient Details  Name: Justin Mendez MRN: [id number] Date of Birth: 01/18/1933 Referring Provider:     Cardiac Rehab from 06/27/2017 in [facility] Cardiac and Pulmonary Rehab  Referring Provider  Sabra Heck      Encounter Date: 08/05/2017  Check In: Session Check In - 08/05/17 1707      Check-In   Location  [facility]Cardiac & Pulmonary Rehab    Staff Present  [place], BS, [place];Nada Maclachlan, BA, ACSM CEP, Exercise Physiologist;Carroll Enterkin, RN, BSN    Supervising physician immediately available to respond to emergencies  See telemetry face sheet for immediately available ER MD    Medication changes reported      No    Fall or balance concerns reported     No    Warm-up and Cool-down  Performed on first and last piece of equipment    Resistance Training Performed  Yes    VAD Patient?  No      Pain Assessment   Currently in Pain?  No/denies    Multiple Pain Sites  No          Social History   Tobacco Use  Smoking Status Former Smoker  . Types: Cigarettes  . Last attempt to quit: 08/31/1984  . Years since quitting: 32.9  Smokeless Tobacco Never Used    Goals Met:  Independence with exercise equipment Exercise tolerated well No report of cardiac concerns or symptoms Strength training completed today  Goals Unmet:  Not Applicable  Comments: Pt able to follow exercise prescription today without complaint.  Will continue to monitor for progression.    Dr. Emily Filbert is Medical Director for [place] and [facility].

## 2017-08-07 DIAGNOSIS — I5022 Chronic systolic (congestive) heart failure: Secondary | ICD-10-CM | POA: Diagnosis not present

## 2017-08-07 NOTE — Progress Notes (Signed)
Daily Session Note  Patient Details  Name: Justin Mendez MRN: [id number] Date of Birth: 09-18-1932 Referring Provider:     Cardiac Rehab from 06/27/2017 in [facility] Cardiac and Pulmonary Rehab  Referring Provider  Sabra Heck      Encounter Date: 08/07/2017  Check In: Session Check In - 08/07/17 1635      Check-In   Location  [facility]Cardiac & Pulmonary Rehab    Staff Present  Renita Papa, RN Vickki Hearing, BA, ACSM CEP, Exercise Physiologist;Mandi Zachery Conch, BS, [facility]    Supervising physician immediately available to respond to emergencies  See telemetry face sheet for immediately available ER MD    Medication changes reported      No    Fall or balance concerns reported     No    Warm-up and Cool-down  Performed on first and last piece of equipment    Resistance Training Performed  Yes    VAD Patient?  No      Pain Assessment   Currently in Pain?  No/denies    Multiple Pain Sites  No          Social History   Tobacco Use  Smoking Status Former Smoker  . Types: Cigarettes  . Last attempt to quit: 08/31/1984  . Years since quitting: 32.9  Smokeless Tobacco Never Used    Goals Met:  Independence with exercise equipment Exercise tolerated well No report of cardiac concerns or symptoms Strength training completed today  Goals Unmet:  Not Applicable  Comments: Pt able to follow exercise prescription today without complaint.  Will continue to monitor for progression.    Dr. Emily Filbert is Medical Director for [place] and [facility].

## 2017-08-08 DIAGNOSIS — I5022 Chronic systolic (congestive) heart failure: Secondary | ICD-10-CM | POA: Diagnosis not present

## 2017-08-08 NOTE — Progress Notes (Signed)
Daily Session Note  Patient Details  Name: Justin Mendez MRN: [id number] Date of Birth: [birth year] Referring Provider:     Cardiac Rehab from 06/27/2017 in [facility] Cardiac and Pulmonary Rehab  Referring Provider  Sabra Heck      Encounter Date: 08/08/2017  Check In: Session Check In - 08/08/17 1633      Check-In   Location  [facility]Cardiac & Pulmonary Rehab    Staff Present  Renita Papa, RN Moises Blood, BS, ACSM CEP, Exercise Physiologist;Joseph Flavia Shipper    Supervising physician immediately available to respond to emergencies  See telemetry face sheet for immediately available ER MD    Medication changes reported      No    Fall or balance concerns reported     No    Warm-up and Cool-down  Performed on first and last piece of equipment    Resistance Training Performed  Yes    VAD Patient?  No      Pain Assessment   Currently in Pain?  No/denies          Social History   Tobacco Use  Smoking Status Former Smoker  . Types: Cigarettes  . Last attempt to quit: 08/31/1984  . Years since quitting: 32.9  Smokeless Tobacco Never Used    Goals Met:  Independence with exercise equipment Exercise tolerated well No report of cardiac concerns or symptoms Strength training completed today  Goals Unmet:  Not Applicable  Comments: Pt able to follow exercise prescription today without complaint.  Will continue to monitor for progression.   Dr. Emily Filbert is Medical Director for [place] and [facility].

## 2017-08-12 ENCOUNTER — Encounter: Payer: Medicare Other | Admitting: *Deleted

## 2017-08-12 DIAGNOSIS — I5022 Chronic systolic (congestive) heart failure: Secondary | ICD-10-CM

## 2017-08-12 NOTE — Progress Notes (Signed)
Daily Session Note  Patient Details  Name: Justin Mendez MRN: [id number] Date of Birth: Mar 23, 1933 Referring Provider:     Cardiac Rehab from 06/27/2017 in [facility] Cardiac and Pulmonary Rehab  Referring Provider  Sabra Heck      Encounter Date: 08/12/2017  Check In: Session Check In - 08/12/17 1623      Check-In   Location  [facility]Cardiac [facility] Pulmonary Rehab    Staff Present  Earlean Shawl, BS, ACSM CEP, Exercise Physiologist;Carroll Enterkin, RN, Vickki Hearing, BA, ACSM CEP, Exercise Physiologist    Supervising physician immediately available to respond to emergencies  See telemetry face sheet for immediately available ER MD    Medication changes reported      No    Fall or balance concerns reported     No    Warm-up and Cool-down  Performed on first and last piece of equipment    Resistance Training Performed  Yes    VAD Patient?  No      Pain Assessment   Currently in Pain?  No/denies    Multiple Pain Sites  No          Social History   Tobacco Use  Smoking Status Former Smoker  . Types: Cigarettes  . Last attempt to quit: 08/31/1984  . Years since quitting: 32.9  Smokeless Tobacco Never Used    Goals Met:  Independence with exercise equipment Exercise tolerated well No report of cardiac concerns or symptoms Strength training completed today  Goals Unmet:  Not Applicable  Comments: Pt able to follow exercise prescription today without complaint.  Will continue to monitor for progression.    Dr. Emily Filbert is Medical Director for [place] and [facility].

## 2017-08-14 ENCOUNTER — Encounter: Payer: Self-pay | Admitting: *Deleted

## 2017-08-14 DIAGNOSIS — I5022 Chronic systolic (congestive) heart failure: Secondary | ICD-10-CM

## 2017-08-14 NOTE — Progress Notes (Signed)
Daily Session Note  Patient Details  Name: Justin Mendez MRN: [id number] Date of Birth: November 05, 1932 Referring Provider:     Cardiac Rehab from 06/27/2017 in [facility] Cardiac and Pulmonary Rehab  Referring Provider  Sabra Heck      Encounter Date: 08/14/2017  Check In: Session Check In - 08/14/17 1705      Check-In   Location  [facility]Cardiac [facility]monary Rehab    Staff Present  Renita Papa, RN Vickki Hearing, BA, ACSM CEP, Exercise Physiologist;Carroll Enterkin, RN, BSN    Supervising physician immediately available to respond to emergencies  See telemetry face sheet for immediately available ER MD    Medication changes reported      No    Fall or balance concerns reported     No    Warm-up and Cool-down  Performed on first and last piece of equipment    Resistance Training Performed  Yes    VAD Patient?  No      Pain Assessment   Currently in Pain?  No/denies          Social History   Tobacco Use  Smoking Status Former Smoker  . Types: Cigarettes  . Last attempt to quit: 08/31/1984  . Years since quitting: 32.9  Smokeless Tobacco Never Used    Goals Met:  Independence with exercise equipment Exercise tolerated well No report of cardiac concerns or symptoms Strength training completed today  Goals Unmet:  Not Applicable  Comments: Pt able to follow exercise prescription today without complaint.  Will continue to monitor for progression.    Dr. Emily Filbert is Medical Director for [place] and [facility].

## 2017-08-14 NOTE — Progress Notes (Signed)
Cardiac Individual Treatment Plan  Patient Details  Name: Justin Mendez MRN: [id number] Date of Birth: [birth year] Referring Provider:     Cardiac Rehab from 06/27/2017 in [facility] Cardiac and Pulmonary Rehab  Referring Provider  Sabra Heck      Initial Encounter Date:    Cardiac Rehab from 06/27/2017 in [facility] Cardiac and Pulmonary Rehab  Date  06/27/17  Referring Provider  Sabra Heck      Visit Diagnosis: Heart failure, chronic systolic ([place])  Patient's Home Medications on Admission:  Current Outpatient Medications:  .  aspirin EC 81 MG tablet, Take 81 mg by mouth daily., Disp: , Rfl:  .  atorvastatin (LIPITOR) 80 MG tablet, , Disp: , Rfl: 2 .  cilostazol (PLETAL) 100 MG tablet, Take 100 mg by mouth 2 (two) times daily., Disp: , Rfl:  .  Cinnamon 500 MG capsule, Take 500 mg by mouth daily. , Disp: , Rfl:  .  clopidogrel (PLAVIX) 75 MG tablet, , Disp: , Rfl: 2 .  Co-Enzyme Q-10 30 MG CAPS, Take by mouth daily. , Disp: , Rfl:  .  diltiazem (CARDIZEM CD) 240 MG 24 hr capsule, Take 1 capsule (240 mg total) by mouth daily. (Patient not taking: Reported on 06/27/2017), Disp: 30 capsule, Rfl: 0 .  ELIQUIS 5 MG TABS tablet, , Disp: , Rfl: 11 .  fluticasone (FLONASE) 50 MCG/ACT nasal spray, SHAKE LQ AND U 1 SPR IEN Q 12 H, Disp: , Rfl: 2 .  furosemide (LASIX) 20 MG tablet, Take 1 tablet (20 mg total) by mouth 2 (two) times daily., Disp: 60 tablet, Rfl: 0 .  isosorbide mononitrate (IMDUR) 30 MG 24 hr tablet, , Disp: , Rfl: 2 .  losartan (COZAAR) 25 MG tablet, Take 25 mg by mouth daily., Disp: , Rfl: 3 .  magnesium oxide (MAG-OX) 400 MG tablet, Take by mouth., Disp: , Rfl:  .  metoprolol succinate (TOPROL-XL) 50 MG 24 hr tablet, Take 1 tablet (50 mg total) by mouth daily. Take with or immediately following a meal., Disp: 30 tablet, Rfl: 0 .  metoprolol succinate (TOPROL-XL) 50 MG 24 hr tablet, Take by mouth., Disp: , Rfl:  .  Multiple Vitamin (MULTI-VITAMINS) TABS, Take by mouth., Disp: , Rfl:  .   niacin (NIASPAN) 1000 MG CR tablet, Take 1,000 mg by mouth daily., Disp: , Rfl:  .  nitroGLYCERIN (NITROSTAT) 0.4 MG SL tablet, DIS 1 T UNT FOR CHEST PAIN UTD. MAY TAKE UP TO 3 DOSES, Disp: , Rfl: 0 .  pantoprazole (PROTONIX) 40 MG tablet, Take 40 mg by mouth daily., Disp: , Rfl: 2 .  potassium chloride SA (K-DUR,KLOR-CON) 20 MEQ tablet, Take 2 tablets (40 mEq total) by mouth daily. (Patient not taking: Reported on 06/27/2017), Disp: 30 tablet, Rfl: 0 .  sertraline (ZOLOFT) 25 MG tablet, , Disp: , Rfl: 2 .  spironolactone (ALDACTONE) 25 MG tablet, , Disp: , Rfl: 2 .  traMADol (ULTRAM) 50 MG tablet, Take by mouth every 8 (eight) hours as needed., Disp: , Rfl:   Past Medical History: Past Medical History:  Diagnosis Date  . H/O carotid endarterectomy   . Hearing loss   . High cholesterol   . Hypertension   . Neuropathy   . PAD (peripheral artery disease) (HCC)     Tobacco Use: Social History   Tobacco Use  Smoking Status Former Smoker  . Types: Cigarettes  . Last attempt to quit: 08/31/1984  . Years since quitting: 32.9  Smokeless Tobacco Never Used    Labs:  Recent Review Flowsheet Data    There is no flowsheet data to display.       Exercise Target Goals:    Exercise Program Goal: Individual exercise prescription set using results from initial 6 min walk test and THRR while considering  patient's activity barriers and safety.   Exercise Prescription Goal: Initial exercise prescription builds to 30-45 minutes a day of aerobic activity, 2-3 days per week.  Home exercise guidelines will be given to patient during program as part of exercise prescription that the participant will acknowledge.  Activity Barriers & Risk Stratification: Activity Barriers & Cardiac Risk Stratification - 06/27/17 1415      Activity Barriers & Cardiac Risk Stratification   Activity Barriers  Left Knee Replacement;Right Knee Replacement    Cardiac Risk Stratification  High       6 Minute  Walk: 6 Minute Walk    Row Name 06/27/17 1424         6 Minute Walk   Distance  1088 feet     Walk Time  6 minutes     # of Rest Breaks  0     MPH  2.06     METS  2.56     RPE  14     Perceived Dyspnea   1     VO2 Peak  8.98     Symptoms  No     Resting HR  68 bpm     Resting BP  114/66     Resting Oxygen Saturation   97 %     Exercise Oxygen Saturation  during 6 min walk  99 %     Max Ex. HR  119 bpm     Max Ex. BP  154/72     2 Minute Post BP  128/70        Oxygen Initial Assessment:   Oxygen Re-Evaluation:   Oxygen Discharge (Final Oxygen Re-Evaluation):   Initial Exercise Prescription: Initial Exercise Prescription - 06/27/17 1400      Date of Initial Exercise RX and Referring Provider   Date  06/27/17    Referring Provider  Sabra Heck      Treadmill   MPH  2    Grade  0    Minutes  15    METs  2.53      Recumbant Bike   Level  1    RPM  60    Watts  15    Minutes  15    METs  2.5      NuStep   Level  2    SPM  80    Minutes  15    METs  2.5      Prescription Details   Frequency (times per week)  3    Duration  Progress to 45 minutes of aerobic exercise without signs/symptoms of physical distress      Intensity   THRR 40-80% of Max Heartrate  96-124    Ratings of Perceived Exertion  11-13    Perceived Dyspnea  0-4      Resistance Training   Training Prescription  Yes    Weight  3 lb    Reps  10-15       Perform Capillary Blood Glucose checks as needed.  Exercise Prescription Changes: Exercise Prescription Changes    Row Name 06/27/17 1400 07/10/17 1200 07/18/17 1600 07/24/17 1100 08/06/17 1100     Response to Exercise   Blood Pressure (Admit)  114/66  122/60  -  112/64  138/62   Blood Pressure (Exercise)  154/72  130/80  -  130/70  -   Blood Pressure (Exit)  128/70  138/72  -  118/70  108/58   Heart Rate (Admit)  81 bpm  81 bpm  -  67 bpm  97 bpm   Heart Rate (Exercise)  119 bpm  92 bpm  -  102 bpm  111 bpm   Heart Rate (Exit)   82 bpm  77 bpm  -  70 bpm  76 bpm   Oxygen Saturation (Admit)  97 %  -  -  -  -   Oxygen Saturation (Exit)  99 %  -  -  -  -   Rating of Perceived Exertion (Exercise)  14  11  -  13  13   Symptoms  -  none  -  none  none   Duration  -  Progress to 45 minutes of aerobic exercise without signs/symptoms of physical distress  -  Progress to 45 minutes of aerobic exercise without signs/symptoms of physical distress  Progress to 45 minutes of aerobic exercise without signs/symptoms of physical distress   Intensity  -  THRR unchanged  -  THRR unchanged  THRR unchanged     Progression   Progression  -  Continue to progress workloads to maintain intensity without signs/symptoms of physical distress.  -  Continue to progress workloads to maintain intensity without signs/symptoms of physical distress.  Continue to progress workloads to maintain intensity without signs/symptoms of physical distress.   Average METs  -  2.3  -  2.45  2.53     Resistance Training   Training Prescription  -  Yes  -  Yes  Yes   Weight  -  3 lb  -  3 lb  3 lb   Reps  -  10-15  -  10-15  10-15     Interval Training   Interval Training  -  No  -  No  No     Treadmill   MPH  -  -  -  -  2   Grade  -  -  -  -  0   Minutes  -  -  -  -  15   METs  -  -  -  -  2.53     Recumbant Bike   Level  -  -  -  1  -   RPM  -  -  -  60  -   Minutes  -  -  -  15  -   METs  -  -  -  2.55  -     NuStep   Level  -  2  -  2  2   SPM  -  80  -  80  80   Minutes  -  15  -  15  15   METs  -  2.3  -  2.4  -     Home Exercise Plan   Plans to continue exercise at  -  -  [employer] home - walk  [employer] home - walk  [employer] home - walk   Frequency  -  -  Add 1 additional day to program exercise sessions.  Add 1 additional day to program exercise sessions.  Add 1 additional day to program exercise sessions.   Initial Home Exercises  Provided  -  -  07/18/17  07/18/17  07/18/17      Exercise Comments: Exercise Comments    Row  Name 07/01/17 1626 07/18/17 1650         Exercise Comments  First full day of exercise!  Patient was oriented to gym and equipment including functions, settings, policies, and procedures.  Patient's individual exercise prescription and treatment plan were reviewed.  All starting workloads were established based on the results of the 6 minute walk test done at initial orientation visit.  The plan for exercise progression was also introduced and progression will be customized based on patient's performance and goals  Reviewed home exercise with pt today.  Pt plans to walk and attend [place] for exercise.  Reviewed THR, pulse, RPE, sign and symptoms, NTG use, and when to call 911 or MD.  Also discussed weather considerations and indoor options.  Pt voiced understanding.         Exercise Goals and Review: Exercise Goals    Row Name 06/27/17 1423             Exercise Goals   Increase Physical Activity  Yes       Intervention  Provide advice, education, support and counseling about physical activity/exercise needs.;Develop an individualized exercise prescription for aerobic and resistive training based on initial evaluation findings, risk stratification, comorbidities and participant's personal goals.       Expected Outcomes  Achievement of increased cardiorespiratory fitness and enhanced flexibility, muscular endurance and strength shown through measurements of functional capacity and personal statement of participant.       Increase Strength and Stamina  Yes       Intervention  Provide advice, education, support and counseling about physical activity/exercise needs.;Develop an individualized exercise prescription for aerobic and resistive training based on initial evaluation findings, risk stratification, comorbidities and participant's personal goals.       Expected Outcomes  Achievement of increased cardiorespiratory fitness and enhanced flexibility, muscular endurance and strength shown  through measurements of functional capacity and personal statement of participant.       Able to understand and use rate of perceived exertion (RPE) scale  Yes       Intervention  Provide education and explanation on how to use RPE scale       Expected Outcomes  Short Term: Able to use RPE daily in rehab to express subjective intensity level;Long Term:  Able to use RPE to guide intensity level when exercising independently       Able to understand and use Dyspnea scale  Yes       Intervention  Provide education and explanation on how to use Dyspnea scale       Expected Outcomes  Short Term: Able to use Dyspnea scale daily in rehab to express subjective sense of shortness of breath during exertion;Long Term: Able to use Dyspnea scale to guide intensity level when exercising independently       Knowledge and understanding of Target Heart Rate Range (THRR)  Yes       Intervention  Provide education and explanation of THRR including how the numbers were predicted and where they are located for reference       Expected Outcomes  Short Term: Able to state/look up THRR;Short Term: Able to use daily as guideline for intensity in rehab;Long Term: Able to use THRR to govern intensity when exercising independently       Able to check pulse independently  Yes  Intervention  Review the importance of being able to check your own pulse for safety during independent exercise;Provide education and demonstration on how to check pulse in carotid and radial arteries.       Expected Outcomes  Short Term: Able to explain why pulse checking is important during independent exercise;Long Term: Able to check pulse independently and accurately       Understanding of Exercise Prescription  Yes       Intervention  Provide education, explanation, and written materials on patient's individual exercise prescription       Expected Outcomes  Short Term: Able to explain program exercise prescription;Long Term: Able to explain home  exercise prescription to exercise independently          Exercise Goals Re-Evaluation : Exercise Goals Re-Evaluation    Row Name 07/10/17 1211 07/18/17 1644 07/24/17 1151 08/06/17 1114       Exercise Goal Re-Evaluation   Exercise Goals Review  Increase Physical Activity;Increase Strength and Stamina;Able to understand and use rate of perceived exertion (RPE) scale  Increase Physical Activity;Increase Strength and Stamina;Knowledge and understanding of Target Heart Rate Range (THRR);Able to understand and use rate of perceived exertion (RPE) scale;Able to check pulse independently;Understanding of Exercise Prescription  Increase Physical Activity;Increase Strength and Stamina;Able to understand and use rate of perceived exertion (RPE) scale  Increase Physical Activity;Increase Strength and Stamina;Able to understand and use rate of perceived exertion (RPE) scale;Understanding of Exercise Prescription    Comments  Justin Mendez is tolerating exercise well. Staff wil continue to monitor.  Justin Mendez plans to walk at home and join [employer] after [employer].  Justin Mendez continues to tolerate exercise well.   Staff will continue to monitor and recommend he increase levels on machines.  He has commented he really enjoys the educational aspect of the classes.   Justin Mendez continues to tolerate exercise well and attends class regularly.  He seems motivated to improve.  Staff will contintue to monitor    Expected Outcomes  Short - Justin Mendez will continue to attend [place] wil be active on a regular basis and improve overall MET level.  [place] will add 1-2 days to program sessions.  Long - Justin Mendez will maintain exercise on his own.  Short - Justin Mendez will continue to attend Long - Justin Mendez will increase overall MET level  Short - Justin Mendez will continue to increase workloads Long - Justin Mendez will improve overall MET level       Discharge Exercise Prescription (Final Exercise Prescription Changes): Exercise Prescription Changes - 08/06/17 1100       Response to Exercise   Blood Pressure (Admit)  138/62    Blood Pressure (Exit)  108/58    Heart Rate (Admit)  97 bpm    Heart Rate (Exercise)  111 bpm    Heart Rate (Exit)  76 bpm    Rating of Perceived Exertion (Exercise)  13    Symptoms  none    Duration  Progress to 45 minutes of aerobic exercise without signs/symptoms of physical distress    Intensity  THRR unchanged      Progression   Progression  Continue to progress workloads to maintain intensity without signs/symptoms of physical distress.    Average METs  2.53      Resistance Training   Training Prescription  Yes    Weight  3 lb    Reps  10-15      Interval Training   Interval Training  No  Treadmill   MPH  2    Grade  0    Minutes  15    METs  2.53      NuStep   Level  2    SPM  80    Minutes  15      Home Exercise Plan   Plans to continue exercise at  [employer] home - walk    Frequency  Add 1 additional day to program exercise sessions.    Initial Home Exercises Provided  07/18/17       Nutrition:  Target Goals: Understanding of nutrition guidelines, daily intake of sodium <1511m, cholesterol <2020m calories 30% from fat and 7% or less from saturated fats, daily to have 5 or more servings of fruits and vegetables.  Biometrics: Pre Biometrics - 06/27/17 1423      Pre Biometrics   Height  6' 0.5" (1.842 m)    Weight  181 lb 9.6 oz (82.4 kg)    Waist Circumference  38.5 inches    Hip Circumference  41.5 inches    Waist to Hip Ratio  0.93 %    BMI (Calculated)  24.28    Single Leg Stand  1.35 seconds        Nutrition Therapy Plan and Nutrition Goals: Nutrition Therapy & Goals - 08/01/17 1132      Nutrition Therapy   Diet  low sodium, basic heart healthy eating    Drug/Food Interactions  Statins/Certain Fruits    Fruits and Vegetables  5 servings/day    Sodium  2200 grams      Personal Nutrition Goals   Nutrition Goal  Continue with current low sodium diet    Personal Goal #2  Eat  several servings of fruit daily and include vegetables as much as possible    Additional Goals?  No    Comments  Mr. FuModis following a 220019modium diet [facility] wife. They are reading food labels and preparing low sodium meals at home. He is not much of a vegetable eater but does eat fruits daily. He reports feeling hungry almost constantly. Discussed basic meal planning including role of protein in helping with satiety, options for low sodium food choices.       Intervention Plan   Intervention  Prescribe, educate and counsel regarding individualized specific dietary modifications aiming towards targeted core components such as weight, hypertension, lipid management, diabetes, heart failure and other comorbidities.;Nutrition handout(s) given to patient.    Expected Outcomes  Short Term Goal: Understand basic principles of dietary content, such as calories, fat, sodium, cholesterol and nutrients.;Short Term Goal: A plan has been developed with personal nutrition goals set during dietitian appointment.;Long Term Goal: Adherence to prescribed nutrition plan.       Nutrition Assessments: Nutrition Assessments - 06/27/17 1407      MEDFICTS Scores   Pre Score  63       Nutrition Goals Re-Evaluation: Nutrition Goals Re-Evaluation    Row Name 07/29/17 1834             Goals   Expected Outcome  Heart healthy eating - has decreased salt intake.           Nutrition Goals Discharge (Final Nutrition Goals Re-Evaluation): Nutrition Goals Re-Evaluation - 07/29/17 1834      Goals   Expected Outcome  Heart healthy eating - has decreased salt intake.        Psychosocial: Target Goals: Acknowledge presence or  absence of significant depression and/or stress, maximize coping skills, provide positive support system. Participant is able to verbalize types and ability to use techniques and skills needed for reducing stress and depression.   Initial  Review & Psychosocial Screening: Initial Psych Review & Screening - 06/27/17 1407      Initial Review   Current issues with  Current Stress Concerns    Source of Stress Concerns  Financial;Occupation;Unable to perform yard/household activities    Comments  He is trying to get back to work part time at [employer] He works and so does his wife to help with the financial issues he encountered after his retirement funds went [place] due to Museum/gallery conserv[employer]. He hates that his wife is having to work, but is looking forward to getting back to his job.       Family Dynamics   Good Support System?  Yes Wife and children      Screening Interventions   Interventions  Yes;Encouraged to exercise    Expected Outcomes  Short Term goal: Utilizing psychosocial counselor, staff and physician to assist with identification of specific Stressors or current issues interfering with healing process. Setting desired goal for each stressor or current issue identified.;Long Term Goal: Stressors or current issues are controlled or eliminated.;Short Term goal: Identification and review with participant of any Quality of Life or Depression concerns found by scoring the questionnaire.;Long Term goal: The participant improves quality of Life and PHQ9 Scores as seen by post scores and/or verbalization of changes       Quality of Life Scores:  Quality of Life - 06/27/17 1411      Quality of Life Scores   Health/Function Pre  22.29 %    Socioeconomic Pre  30 %    Psych/Spiritual Pre  22.5 %    Family Pre  30 %    GLOBAL Pre  25.42 %      Scores of 19 and below usually indicate a poorer quality of life in these areas.  A difference of  2-3 points is a clinically meaningful difference.  A difference of 2-3 points in the total score of the Quality of Life Index has been associated with significant improvement in overall quality of life, self-image, physical symptoms, and general health in studies assessing change in quality of  life.  PHQ-9: Recent Review Flowsheet Data    Depression screen [facility] 2/9 06/27/2017   Decreased Interest 0   Down, Depressed, Hopeless 2   PHQ - 2 Score 2   Altered sleeping 1   Tired, decreased energy 1   Change in appetite 0   Feeling bad or failure about yourself  0   Trouble concentrating 0   Moving slowly or fidgety/restless 0   Suicidal thoughts 0   PHQ-9 Score 4   Difficult doing work/chores Not difficult at all     Interpretation of Total Score  Total Score Depression Severity:  1-4 = Minimal depression, 5-9 = Mild depression, 10-14 = Moderate depression, 15-19 = Moderately severe depression, 20-27 = Severe depression   Psychosocial Evaluation and Intervention: Psychosocial Evaluation - 07/01/17 1724      Psychosocial Evaluation & Interventions   Interventions  Stress management education;Relaxation education;Encouraged to exercise with the program and follow exercise prescription    Comments  Counselor met with Mr. Ellena Justin Mendez) and his spouse Justin Mendez today for initial psychosocial evaluation.  He is an 84 year old who was diagnosed with CHF 3 months ago and was told he  has only 35% of his heart working currently.  Justin Mendez has a great support system with his spouse of over 43 years and a daughter and son-in-law who live locally.  Justin Mendez has sleep apnea but has been sleeping well with medications and his CPAP for awhile.  He has a "GREAT" appetite but spouse is having to learn how to cook differently to help him eat more healthy.  Justin Mendez has had knee replacment surgery 5 years ago.  He denies a history of depression or anxiety and reports he is typically positive most of the time.  Bette reports he can get irritable at times and he agreed as this latest prognosis with his blockages and being told they are inoperable is discouraging.  Justin Mendez reports his health and his finances are his primary stressors currently.  He has goals to strengthen his heart to 45% functioning - if possible.  Staff  will follow with Justin Mendez.    Expected Outcomes  Justin Mendez will benefit from consistent exercise to achieve his stated goals.  The educational and psychoeducational components of this program will be helpful in understanding and coping with his condition in a more positive way.      Continue Psychosocial Services   Follow up required by staff       Psychosocial Re-Evaluation: Psychosocial Re-Evaluation    [place] Name 07/29/17 1833             Psychosocial Re-Evaluation   Current issues with  Current Stress Concerns       Expected Outcomes  Decreased stress. He wants to return to work to help with expenses.        Interventions  Encouraged to attend Cardiac Rehabilitation for the exercise          Psychosocial Discharge (Final Psychosocial Re-Evaluation): Psychosocial Re-Evaluation - 07/29/17 1833      Psychosocial Re-Evaluation   Current issues with  Current Stress Concerns    Expected Outcomes  Decreased stress. He wants to return to work to help with expenses.     Interventions  Encouraged to attend Cardiac Rehabilitation for the exercise       Vocational Rehabilitation: Provide vocational rehab assistance to qualifying candidates.   Vocational Rehab Evaluation & Intervention: Vocational Rehab - 06/27/17 1413      Initial Vocational Rehab Evaluation & Intervention   Assessment shows need for Vocational Rehabilitation  No       Education: Education Goals: Education classes will be provided on a variety of topics geared toward better understanding of heart health and risk factor modification. Participant will state understanding/return demonstration of topics presented as noted by education test scores.  Learning Barriers/Preferences: Learning Barriers/Preferences - 06/27/17 1412      Learning Barriers/Preferences   Learning Barriers  Hearing    Learning Preferences  Individual Instruction;Skilled Demonstration;Written Material       Education Topics:  AED/CPR: - Group  verbal and written instruction with the use of models to demonstrate the basic use of the AED with the basic ABC's of resuscitation.   General Nutrition Guidelines/Fats and Fiber: -Group instruction provided by verbal, written material, models and posters to present the general guidelines for heart healthy nutrition. Gives an explanation and review of dietary fats and fiber.   Cardiac Rehab from 08/12/2017 in [facility] Cardiac and Pulmonary Rehab  Date  08/12/17  Educator  PI  Instruction Review Code  1- Verbalizes Understanding      Controlling Sodium/Reading Food Labels: -Group verbal and written material supporting the discussion  of sodium use in heart healthy nutrition. Review and explanation with models, verbal and written materials for utilization of the food label.   Cardiac Rehab from 08/12/2017 in [facility] Cardiac and Pulmonary Rehab  Date  07/01/17  Educator  PI  Instruction Review Code  1- Verbalizes Understanding      Exercise Physiology & General Exercise Guidelines: - Group verbal and written instruction with models to review the exercise physiology of the cardiovascular system and associated critical values. Provides general exercise guidelines with specific guidelines to those with heart or lung disease.    Cardiac Rehab from 08/12/2017 in [facility] Cardiac and Pulmonary Rehab  Date  07/10/17  Educator  AS  Instruction Review Code  1- Verbalizes Understanding      Aerobic Exercise & Resistance Training: - Gives group verbal and written instruction on the various components of exercise. Focuses on aerobic and resistive training programs and the benefits of this training and how to safely progress through these programs..   Cardiac Rehab from 08/12/2017 in [facility] Cardiac and Pulmonary Rehab  Date  07/17/17  Educator  AS  Instruction Review Code  1- Verbalizes Understanding      Flexibility, Balance, Mind/Body Relaxation: Provides group verbal/written instruction on the benefits of  flexibility and balance training, including mind/body exercise modes such as yoga, pilates and tai chi.  Demonstration and skill practice provided.   Cardiac Rehab from 08/12/2017 in [facility] Cardiac and Pulmonary Rehab  Date  07/22/17  Educator  AS  Instruction Review Code  1- Verbalizes Understanding      Stress and Anxiety: - Provides group verbal and written instruction about the health risks of elevated stress and causes of high stress.  Discuss the correlation between heart/lung disease and anxiety and treatment options. Review healthy ways to manage with stress and anxiety.   Cardiac Rehab from 08/12/2017 in [facility] Cardiac and Pulmonary Rehab  Date  08/07/17  Educator  [facility]  Instruction Review Code  1- Verbalizes Understanding      Depression: - Provides group verbal and written instruction on the correlation between heart/lung disease and depressed mood, treatment options, and the stigmas associated with seeking treatment.   Anatomy & Physiology of the Heart: - Group verbal and written instruction and models provide basic cardiac anatomy and physiology, with the coronary electrical and arterial systems. Review of Valvular disease and Heart Failure   Cardiac Rehab from 08/12/2017 in [facility] Cardiac and Pulmonary Rehab  Date  08/05/17  Educator  CE  Instruction Review Code  1- Verbalizes Understanding      Cardiac Procedures: - Group verbal and written instruction to review commonly prescribed medications for heart disease. Reviews the medication, class of the drug, and side effects. Includes the steps to properly store meds and maintain the prescription regimen. (beta blockers and nitrates)   Cardiac Medications I: - Group verbal and written instruction to review commonly prescribed medications for heart disease. Reviews the medication, class of the drug, and side effects. Includes the steps to properly store meds and maintain the prescription regimen.   Cardiac Rehab from 08/12/2017 in  [facility] Cardiac and Pulmonary Rehab  Date  07/29/17 [Part 2 07/31/17]  Educator  [place]  Instruction Review Code  1- Verbalizes Understanding      Cardiac Medications II: -Group verbal and written instruction to review commonly prescribed medications for heart disease. Reviews the medication, class of the drug, and side effects. (all other drug classes)   Cardiac Rehab from 08/12/2017 in [facility] Cardiac and  Pulmonary Rehab  Date  07/24/17 [and risk factors]  Educator  [place]  Instruction Review Code  1- Verbalizes Understanding       Go Sex-Intimacy & Heart Disease, Get SMART - Goal Setting: - Group verbal and written instruction through game format to discuss heart disease and the return to sexual intimacy. Provides group verbal and written material to discuss and apply goal setting through the application of the S.M.A.R.T. Method.   Other Matters of the Heart: - Provides group verbal, written materials and models to describe Stable Angina and Peripheral Artery. Includes description of the disease process and treatment options available to the cardiac patient.   Exercise & Equipment Safety: - Individual verbal instruction and demonstration of equipment use and safety with use of the equipment.   Cardiac Rehab from 08/12/2017 in [facility] Cardiac and Pulmonary Rehab  Date  06/27/17  Educator  [facility]  Instruction Review Code  1- Verbalizes Understanding      Infection Prevention: - Provides verbal and written material to individual with discussion of infection control including proper hand washing and proper equipment cleaning during exercise session.   Cardiac Rehab from 08/12/2017 in [facility] Cardiac and Pulmonary Rehab  Date  06/27/17  Educator  [facility]  Instruction Review Code  1- Verbalizes Understanding      Falls Prevention: - Provides verbal and written material to individual with discussion of falls prevention and safety.   Cardiac Rehab from 08/12/2017 in [facility] Cardiac and Pulmonary Rehab  Date   06/27/17  Educator  [facility]  Instruction Review Code  1- Verbalizes Understanding      Diabetes: - Individual verbal and written instruction to review signs/symptoms of diabetes, desired ranges of glucose level fasting, after meals and with exercise. Acknowledge that pre and post exercise glucose checks will be done for 3 sessions at entry of program.   Know Your Numbers and Risk Factors: -Group verbal and written instruction about important numbers in your health.  Discussion of what are risk factors and how they play a role in the disease process.  Review of Cholesterol, Blood Pressure, Diabetes, and BMI and the role they play in your overall health.   Cardiac Rehab from 08/12/2017 in [facility] Cardiac and Pulmonary Rehab  Date  07/24/17 [and risk factors]  Educator  [facility]  Instruction Review Code  1- Verbalizes Understanding      Sleep Hygiene: -Provides group verbal and written instruction about how sleep can affect your health.  Define sleep hygiene, discuss sleep cycles and impact of sleep habits. Review good sleep hygiene tips.    Other: -Provides group and verbal instruction on various topics (see comments)   Knowledge Questionnaire Score: Knowledge Questionnaire Score - 06/27/17 1413      Knowledge Questionnaire Score   Pre Score  22/28 correct answers reviewed with patient       Core Components/Risk Factors/Patient Goals at Admission: Personal Goals and Risk Factors at Admission - 06/27/17 1355      Core Components/Risk Factors/Patient Goals on Admission    Weight Management  Weight Maintenance;Yes    Intervention  Weight Management: Develop a combined nutrition and exercise program designed to reach desired caloric intake, while maintaining appropriate intake of nutrient and fiber, sodium and fats, and appropriate energy expenditure required for the weight goal.;Weight Management: Provide education and appropriate resources to help participant work on and attain dietary  goals.;Weight Management/Obesity: Establish reasonable short term and long term weight goals.    Admit Weight  181 lb (82.1 kg)  Weighing with clothes on, usually 174-175 at home. Would like to maintain that weight.    Goal Weight: Short Term  176 lb (79.8 kg)    Goal Weight: Long Term  175 lb (79.4 kg)    Expected Outcomes  Short Term: Continue to assess and modify interventions until short term weight is achieved;Long Term: Adherence to nutrition and physical activity/exercise program aimed toward attainment of established weight goal;Weight Maintenance: Understanding of the daily nutrition guidelines, which includes 25-35% calories from fat, 7% or less cal from saturated fats, less than 228m cholesterol, less than 1.5gm of sodium, & 5 or more servings of fruits and vegetables daily;Understanding recommendations for meals to include 15-35% energy as protein, 25-35% energy from fat, 35-60% energy from carbohydrates, less than 2024mof dietary cholesterol, 20-35 gm of total fiber daily;Understanding of distribution of calorie intake throughout the day with the consumption of 4-5 meals/snacks    Heart Failure  Yes    Intervention  Provide a combined exercise and nutrition program that is supplemented with education, support and counseling about heart failure. Directed toward relieving symptoms such as shortness of breath, decreased exercise tolerance, and extremity edema.    Expected Outcomes  Improve functional capacity of life;Short term: Attendance in program 2-3 days a week with increased exercise capacity. Reported lower sodium intake. Reported increased fruit and vegetable intake. Reports medication compliance.;Short term: Daily weights obtained and reported for increase. Utilizing diuretic protocols set by physician.;Long term: Adoption of self-care skills and reduction of barriers for early signs and symptoms recognition and intervention leading to self-care maintenance.    Hypertension  Yes     Intervention  Provide education on lifestyle modifcations including regular physical activity/exercise, weight management, moderate sodium restriction and increased consumption of fresh fruit, vegetables, and low fat dairy, alcohol moderation, and smoking cessation.;Monitor prescription use compliance.    Expected Outcomes  Short Term: Continued assessment and intervention until BP is < 140/9021mG in hypertensive participants. < 130/69m27m in hypertensive participants with diabetes, heart failure or chronic kidney disease.;Long Term: Maintenance of blood pressure at goal levels.    Stress  Yes Trying to get back to work at [employer] time. He encountered issues with his retirement when he retired early in life, so financial issues ar ebecoming a stressor.     Intervention  Offer individual and/or small group education and counseling on adjustment to heart disease, stress management and health-related lifestyle change. Teach and support self-help strategies.;Refer participants experiencing significant psychosocial distress to appropriate mental health specialists for further evaluation and treatment. When possible, include family members and significant others in education/counseling sessions.    Expected Outcomes  Short Term: Participant demonstrates changes in health-related behavior, relaxation and other stress management skills, ability to obtain effective social support, and compliance with psychotropic medications if prescribed.;Long Term: Emotional wellbeing is indicated by absence of clinically significant psychosocial distress or social isolation.       Core Components/Risk Factors/Patient Goals Review:  Goals and Risk Factor Review    Row Name 07/29/17 1831             Core Components/Risk Factors/Patient Goals Review   Personal Goals Review  Heart Failure;Hypertension;Lipids       Review  Justin Mendez his wife is really trying to cook healthy for him. Justin Mendez's goal is to get  35-40% of his heart function back. Justin Mendez mentioned many times that he has irregular heart beat because he only has 30% of his heart functiion.  Justin Mendez said he gets alot out of Cardiac Rehab and will cont his exercise to control his lipids and blood pressure.        Expected Outcomes  Heart healthy lifestyle for his 84 year old man and to improve his heart function.           Core Components/Risk Factors/Patient Goals at Discharge (Final Review):  Goals and Risk Factor Review - 07/29/17 1831      Core Components/Risk Factors/Patient Goals Review   Personal Goals Review  Heart Failure;Hypertension;Lipids    Review  Justin Mendez said his wife is really trying to cook healthy for him. Justin Mendez's goal is to get 35-40% of his heart function back. Justin Mendez has mentioned many times that he has irregular heart beat because he only has 30% of his heart functiion. Justin Mendez said he gets alot out of Cardiac Rehab and will cont his exercise to control his lipids and blood pressure.     Expected Outcomes  Heart healthy lifestyle for his 84 year old man and to improve his heart function.        ITP Comments: ITP Comments    Row Name 06/27/17 1338 07/11/17 0712 07/17/17 1022 07/29/17 1831 08/14/17 0628   ITP Comments  Med Review completed. Initial ITP created. Diagnosis can be found in [facility] 04/26/17   Kathleene Hazel age 76 last evening in Cardiac Rehab had multiple PVCs. He said he was more tired than usual yesterday during the day. The only caffeine he had was hot tea. Cylus reported that you started him on Magnesium tablets about 4 days ago. Reported to his primary care MD Dr. Sabra Heck by fax and email via [facility].  30 day review. Continue with ITP unless directed changes per Medical Director review.  New to program  Jasin said he only has 30% of his heart and DR. Sabra Heck wants him to cont exercising even to join the [employer]	[place] class after he completes Cardiac rehab.   30 Day review. Continue with ITP  unless directed changes per Medical Director review.       Comments:

## 2017-08-15 DIAGNOSIS — I5022 Chronic systolic (congestive) heart failure: Secondary | ICD-10-CM

## 2017-08-15 NOTE — Progress Notes (Signed)
Daily Session Note  Patient Details  Name: Azam Gervasi MRN: [id number] Date of Birth: 08/25/1932 Referring Provider:     Cardiac Rehab from 06/27/2017 in [facility] Cardiac and Pulmonary Rehab  Referring Provider  Sabra Heck      Encounter Date: 08/15/2017  Check In: Session Check In - 08/15/17 1707      Check-In   Location  [facility]-Cardiac & Pulmonary Rehab    Staff Present  Earlean Shawl, BS, ACSM CEP, Exercise Physiologist;Lillian Ballester Alcus Dad, RN BSN    Supervising physician immediately available to respond to emergencies  See telemetry face sheet for immediately available ER MD    Medication changes reported      No    Fall or balance concerns reported     No    Warm-up and Cool-down  Performed as group-led instruction    Resistance Training Performed  Yes    VAD Patient?  No      Pain Assessment   Currently in Pain?  No/denies          Social History   Tobacco Use  Smoking Status Former Smoker  . Types: Cigarettes  . Last attempt to quit: 08/31/1984  . Years since quitting: 32.9  Smokeless Tobacco Never Used    Goals Met:  Independence with exercise equipment Exercise tolerated well No report of cardiac concerns or symptoms Strength training completed today  Goals Unmet:  Not Applicable  Comments: Pt able to follow exercise prescription today without complaint.  Will continue to monitor for progression.   Dr. Emily Filbert is Medical Director for [place] and [facility].

## 2017-08-19 ENCOUNTER — Encounter: Payer: Medicare Other | Attending: Internal Medicine | Admitting: *Deleted

## 2017-08-19 VITALS — Ht 72.5 in | Wt 190.5 lb

## 2017-08-19 DIAGNOSIS — I5022 Chronic systolic (congestive) heart failure: Secondary | ICD-10-CM | POA: Diagnosis not present

## 2017-08-19 NOTE — Progress Notes (Signed)
Daily Session Note  Patient Details  Name: Justin Mendez MRN: [id number] Date of Birth: September 13, 1932 Referring Provider:     Cardiac Rehab from 06/27/2017 in [facility] Cardiac and Pulmonary Rehab  Referring Provider  Sabra Heck      Encounter Date: 08/19/2017  Check In: Session Check In - 08/19/17 1622      Check-In   Location  [facility]-Cardiac & Pulmonary Rehab    Staff Present  Earlean Shawl, BS, ACSM CEP, Exercise Physiologist;Amanda Oletta Darter, BA, ACSM CEP, Exercise Physiologist;Carroll Enterkin, RN, BSN    Supervising physician immediately available to respond to emergencies  See telemetry face sheet for immediately available ER MD    Medication changes reported      No    Fall or balance concerns reported     No    Warm-up and Cool-down  Performed on first and last piece of equipment    Resistance Training Performed  Yes    VAD Patient?  No      Pain Assessment   Currently in Pain?  No/denies    Multiple Pain Sites  No          Social History   Tobacco Use  Smoking Status Former Smoker  . Types: Cigarettes  . Last attempt to quit: 08/31/1984  . Years since quitting: 32.9  Smokeless Tobacco Never Used    Goals Met:  Independence with exercise equipment Exercise tolerated well No report of cardiac concerns or symptoms Strength training completed today  Goals Unmet:  Not Applicable  Comments: Pt able to follow exercise prescription today without complaint.  Will continue to monitor for progression.    Dr. Emily Filbert is Medical Director for [place] and [facility].

## 2017-08-21 ENCOUNTER — Encounter: Payer: Medicare Other | Admitting: *Deleted

## 2017-08-21 DIAGNOSIS — I5022 Chronic systolic (congestive) heart failure: Secondary | ICD-10-CM | POA: Diagnosis not present

## 2017-08-21 NOTE — Patient Instructions (Signed)
Discharge Patient Instructions  Patient Details  Name: Justin Mendez MRN: [id number] Date of Birth: May 19, 1933 Referring Provider:  Rusty Aus, MD   Number of Visits: 36  Reason for Discharge:  Patient reached a stable level of exercise. Patient independent in their exercise. Patient has met program and personal goals.  Smoking History:  Social History   Tobacco Use  Smoking Status Former Smoker  . Types: Cigarettes  . Last attempt to quit: 08/31/1984  . Years since quitting: 32.9  Smokeless Tobacco Never Used    Diagnosis:  No diagnosis found.  Initial Exercise Prescription: Initial Exercise Prescription - 06/27/17 1400      Date of Initial Exercise RX and Referring Provider   Date  06/27/17    Referring Provider  Sabra Heck      Treadmill   MPH  2    Grade  0    Minutes  15    METs  2.53      Recumbant Bike   Level  1    RPM  60    Watts  15    Minutes  15    METs  2.5      NuStep   Level  2    SPM  80    Minutes  15    METs  2.5      Prescription Details   Frequency (times per week)  3    Duration  Progress to 45 minutes of aerobic exercise without signs/symptoms of physical distress      Intensity   THRR 40-80% of Max Heartrate  96-124    Ratings of Perceived Exertion  11-13    Perceived Dyspnea  0-4      Resistance Training   Training Prescription  Yes    Weight  3 lb    Reps  10-15       Discharge Exercise Prescription (Final Exercise Prescription Changes): Exercise Prescription Changes - 08/21/17 1200      Response to Exercise   Blood Pressure (Admit)  102/62    Blood Pressure (Exercise)  124/68    Blood Pressure (Exit)  120/60    Heart Rate (Admit)  71 bpm    Heart Rate (Exercise)  112 bpm    Heart Rate (Exit)  67 bpm    Rating of Perceived Exertion (Exercise)  13    Symptoms  none    Duration  Continue with 45 min of aerobic exercise without signs/symptoms of physical distress.    Intensity  THRR unchanged      Progression    Progression  Continue to progress workloads to maintain intensity without signs/symptoms of physical distress.    Average METs  2.53      Resistance Training   Training Prescription  Yes    Weight  3 lb    Reps  10-15      Interval Training   Interval Training  No      Treadmill   MPH  2    Grade  0    Minutes  15    METs  2.53      Recumbant Bike   Level  1    RPM  60    Watts  18    Minutes  15    METs  2.55      Home Exercise Plan   Plans to continue exercise at  [employer] home - walk    Frequency  Add 1 additional day to program exercise  sessions.    Initial Home Exercises Provided  07/18/17       Functional Capacity: 6 Minute Walk    Row Name 06/27/17 1424 08/19/17 1748       6 Minute Walk   Phase  -  Discharge    Distance  1088 feet  1180 feet    Distance % Change  -  9 %    Distance Feet Change  -  100 ft    Walk Time  6 minutes  6 minutes    # of Rest Breaks  0  0    MPH  2.06  2.25    METS  2.56  2.18    RPE  14  13    Perceived Dyspnea   1  -    VO2 Peak  8.98  7.64    Symptoms  No  Yes (comment)    Comments  -  knee pain    Resting HR  68 bpm  71 bpm    Resting BP  114/66  102/62    Resting Oxygen Saturation   97 %  -    Exercise Oxygen Saturation  during 6 min walk  99 %  -    Max Ex. HR  119 bpm  96 bpm    Max Ex. BP  154/72  124/68    2 Minute Post BP  128/70  -       Quality of Life: Quality of Life - 08/19/17 1838      Quality of Life Scores   Health/Function Pre  22.29 %    Health/Function Post  18.4 %    Health/Function % Change  -17.45 %    Socioeconomic Pre  30 %    Socioeconomic Post  27.5 %    Socioeconomic % Change   -8.33 %    Psych/Spiritual Pre  22.5 %    Psych/Spiritual Post  16.29 %    Psych/Spiritual % Change  -27.6 %    Family Pre  30 %    Family Post  27.6 %    Family % Change  -8 %    GLOBAL Pre  25.42 %    GLOBAL Post  21 %    GLOBAL % Change  -17.39 %       Personal Goals: Goals established at  orientation with interventions provided to work toward goal. Personal Goals and Risk Factors at Admission - 06/27/17 1355      Core Components/Risk Factors/Patient Goals on Admission    Weight Management  Weight Maintenance;Yes    Intervention  Weight Management: Develop a combined nutrition and exercise program designed to reach desired caloric intake, while maintaining appropriate intake of nutrient and fiber, sodium and fats, and appropriate energy expenditure required for the weight goal.;Weight Management: Provide education and appropriate resources to help participant work on and attain dietary goals.;Weight Management/Obesity: Establish reasonable short term and long term weight goals.    Admit Weight  181 lb (82.1 kg) Weighing with clothes on, usually 174-175 at home. Would like to maintain that weight.    Goal Weight: Short Term  176 lb (79.8 kg)    Goal Weight: Long Term  175 lb (79.4 kg)    Expected Outcomes  Short Term: Continue to assess and modify interventions until short term weight is achieved;Long Term: Adherence to nutrition and physical activity/exercise program aimed toward attainment of established weight goal;Weight Maintenance: Understanding of the daily nutrition guidelines, which includes 25-35% calories from  fat, 7% or less cal from saturated fats, less than 23m cholesterol, less than 1.5gm of sodium, & 5 or more servings of fruits and vegetables daily;Understanding recommendations for meals to include 15-35% energy as protein, 25-35% energy from fat, 35-60% energy from carbohydrates, less than 2062mof dietary cholesterol, 20-35 gm of total fiber daily;Understanding of distribution of calorie intake throughout the day with the consumption of 4-5 meals/snacks    Heart Failure  Yes    Intervention  Provide a combined exercise and nutrition program that is supplemented with education, support and counseling about heart failure. Directed toward relieving symptoms such as  shortness of breath, decreased exercise tolerance, and extremity edema.    Expected Outcomes  Improve functional capacity of life;Short term: Attendance in program 2-3 days a week with increased exercise capacity. Reported lower sodium intake. Reported increased fruit and vegetable intake. Reports medication compliance.;Short term: Daily weights obtained and reported for increase. Utilizing diuretic protocols set by physician.;Long term: Adoption of self-care skills and reduction of barriers for early signs and symptoms recognition and intervention leading to self-care maintenance.    Hypertension  Yes    Intervention  Provide education on lifestyle modifcations including regular physical activity/exercise, weight management, moderate sodium restriction and increased consumption of fresh fruit, vegetables, and low fat dairy, alcohol moderation, and smoking cessation.;Monitor prescription use compliance.    Expected Outcomes  Short Term: Continued assessment and intervention until BP is < 140/9044mG in hypertensive participants. < 130/48m33m in hypertensive participants with diabetes, heart failure or chronic kidney disease.;Long Term: Maintenance of blood pressure at goal levels.    Stress  Yes Trying to get back to work at [employer] time. He encountered issues with his retirement when he retired early in life, so financial issues ar ebecoming a stressor.     Intervention  Offer individual and/or small group education and counseling on adjustment to heart disease, stress management and health-related lifestyle change. Teach and support self-help strategies.;Refer participants experiencing significant psychosocial distress to appropriate mental health specialists for further evaluation and treatment. When possible, include family members and significant others in education/counseling sessions.    Expected Outcomes  Short Term: Participant demonstrates changes in health-related behavior, relaxation and  other stress management skills, ability to obtain effective social support, and compliance with psychotropic medications if prescribed.;Long Term: Emotional wellbeing is indicated by absence of clinically significant psychosocial distress or social isolation.        Personal Goals Discharge: Goals and Risk Factor Review - 07/29/17 1831      Core Components/Risk Factors/Patient Goals Review   Personal Goals Review  Heart Failure;Hypertension;Lipids    Review  MichMaddixd his wife is really trying to cook healthy for him. Avish's goal is to get 35-40% of his heart function back. MichMarek mentioned many times that he has irregular heart beat because he only has 30% of his heart functiion. MichLocd he gets alot out of Cardiac Rehab and will cont his exercise to control his lipids and blood pressure.     Expected Outcomes  Heart healthy lifestyle for his 84 y77r old man and to improve his heart function.        Exercise Goals and Review: Exercise Goals    Row Name 06/27/17 1423             Exercise Goals   Increase Physical Activity  Yes       Intervention  Provide advice, education, support and counseling about physical activity/exercise  needs.;Develop an individualized exercise prescription for aerobic and resistive training based on initial evaluation findings, risk stratification, comorbidities and participant's personal goals.       Expected Outcomes  Achievement of increased cardiorespiratory fitness and enhanced flexibility, muscular endurance and strength shown through measurements of functional capacity and personal statement of participant.       Increase Strength and Stamina  Yes       Intervention  Provide advice, education, support and counseling about physical activity/exercise needs.;Develop an individualized exercise prescription for aerobic and resistive training based on initial evaluation findings, risk stratification, comorbidities and participant's personal goals.        Expected Outcomes  Achievement of increased cardiorespiratory fitness and enhanced flexibility, muscular endurance and strength shown through measurements of functional capacity and personal statement of participant.       Able to understand and use rate of perceived exertion (RPE) scale  Yes       Intervention  Provide education and explanation on how to use RPE scale       Expected Outcomes  Short Term: Able to use RPE daily in rehab to express subjective intensity level;Long Term:  Able to use RPE to guide intensity level when exercising independently       Able to understand and use Dyspnea scale  Yes       Intervention  Provide education and explanation on how to use Dyspnea scale       Expected Outcomes  Short Term: Able to use Dyspnea scale daily in rehab to express subjective sense of shortness of breath during exertion;Long Term: Able to use Dyspnea scale to guide intensity level when exercising independently       Knowledge and understanding of Target Heart Rate Range (THRR)  Yes       Intervention  Provide education and explanation of THRR including how the numbers were predicted and where they are located for reference       Expected Outcomes  Short Term: Able to state/look up THRR;Short Term: Able to use daily as guideline for intensity in rehab;Long Term: Able to use THRR to govern intensity when exercising independently       Able to check pulse independently  Yes       Intervention  Review the importance of being able to check your own pulse for safety during independent exercise;Provide education and demonstration on how to check pulse in carotid and radial arteries.       Expected Outcomes  Short Term: Able to explain why pulse checking is important during independent exercise;Long Term: Able to check pulse independently and accurately       Understanding of Exercise Prescription  Yes       Intervention  Provide education, explanation, and written materials on patient's  individual exercise prescription       Expected Outcomes  Short Term: Able to explain program exercise prescription;Long Term: Able to explain home exercise prescription to exercise independently          Nutrition & Weight - Outcomes: Pre Biometrics - 06/27/17 1423      Pre Biometrics   Height  6' 0.5" (1.842 m)    Weight  181 lb 9.6 oz (82.4 kg)    Waist Circumference  38.5 inches    Hip Circumference  41.5 inches    Waist to Hip Ratio  0.93 %    BMI (Calculated)  24.28    Single Leg Stand  1.35 seconds      Post  Biometrics - 08/19/17 1748       Post  Biometrics   Height  6' 0.5" (1.842 m)    Weight  190 lb 8 oz (86.4 kg)    Waist Circumference  39 inches    Hip Circumference  43 inches    Waist to Hip Ratio  0.91 %    BMI (Calculated)  25.47    Single Leg Stand  2.87 seconds       Nutrition: Nutrition Therapy & Goals - 08/01/17 1132      Nutrition Therapy   Diet  low sodium, basic heart healthy eating    Drug/Food Interactions  Statins/Certain Fruits    Fruits and Vegetables  5 servings/day    Sodium  2200 grams      Personal Nutrition Goals   Nutrition Goal  Continue with current low sodium diet    Personal Goal #2  Eat several servings of fruit daily and include vegetables as much as possible    Additional Goals?  No    Comments  Mr. Moch is following [facility] 2255m [facility] support from [facility] wife. They are reading food labels and preparing low sodium meals at home. He is not much of a vegetable eater but does eat fruits daily. He reports feeling hungry almost constantly. Discussed basic meal planning including role of protein in helping with satiety, options for low sodium food choices.       Intervention Plan   Intervention  Prescribe, educate and counsel regarding individualized specific dietary modifications aiming towards targeted core components such as weight, hypertension, lipid management, diabetes, heart failure and other  comorbidities.;Nutrition handout(s) given to patient.    Expected Outcomes  Short Term Goal: Understand basic principles of dietary content, such as calories, fat, sodium, cholesterol and nutrients.;Short Term Goal: A plan has been developed with personal nutrition goals set during dietitian appointment.;Long Term Goal: Adherence to prescribed nutrition plan.       Nutrition Discharge: Nutrition Assessments - 08/19/17 1837      MEDFICTS Scores   Pre Score  63    Post Score  48    Score Difference  -15       Education Questionnaire Score: Knowledge Questionnaire Score - 08/19/17 1837      Knowledge Questionnaire Score   Pre Score  22/28       Goals reviewed with patient; copy given to patient.

## 2017-08-21 NOTE — Progress Notes (Signed)
Daily Session Note  Patient Details  Name: Justin Mendez MRN: [id number] Date of Birth: [birth year] Referring Provider:     Cardiac Rehab from 06/27/2017 in [facility]  Referring Provider  Sabra Heck      Encounter Date: 08/21/2017  Check In: Session Check In - 08/21/17 1627      Check-In   Location  [facility]-Cardiac & Pulmonary Rehab    Staff Present  Renita Papa, RN Vickki Hearing, BA, ACSM CEP, Exercise Physiologist;Carroll Enterkin, RN, BSN    Supervising physician immediately available to respond to emergencies  See telemetry face sheet for immediately available ER MD    Medication changes reported      No    Fall or balance concerns reported     No    Warm-up and Cool-down  Performed on first and last piece of equipment    Resistance Training Performed  Yes    VAD Patient?  No      Pain Assessment   Currently in Pain?  No/denies        Exercise Prescription Changes - 08/21/17 1200      Response to Exercise   Blood Pressure (Admit)  102/62    Blood Pressure (Exercise)  124/68    Blood Pressure (Exit)  120/60    Heart Rate (Admit)  71 bpm    Heart Rate (Exercise)  112 bpm    Heart Rate (Exit)  67 bpm    Rating of Perceived Exertion (Exercise)  13    Symptoms  none    Duration  Continue with 45 min of aerobic exercise without signs/symptoms of physical distress.    Intensity  THRR unchanged      Progression   Progression  Continue to progress workloads to maintain intensity without signs/symptoms of physical distress.    Average METs  2.53      Resistance Training   Training Prescription  Yes    Weight  3 lb    Reps  10-15      Interval Training   Interval Training  No      Treadmill   MPH  2    Grade  0    Minutes  15    METs  2.53      Recumbant Bike   Level  1    RPM  60    Watts  18    Minutes  15    METs  2.55      Home Exercise Plan   Plans to continue exercise at  [employer] home - walk    Frequency  Add 1  additional day to program exercise sessions.    Initial Home Exercises Provided  07/18/17       Social History   Tobacco Use  Smoking Status Former Smoker  . Types: Cigarettes  . Last attempt to quit: 08/31/1984  . Years since quitting: 32.9  Smokeless Tobacco Never Used    Goals Met:  Independence with exercise equipment Exercise tolerated well No report of cardiac concerns or symptoms Strength training completed today  Goals Unmet:  Not Applicable  Comments: Pt able to follow exercise prescription today without complaint.  Will continue to monitor for progression.    Dr. Emily Filbert is Medical Director for [facility].

## 2017-08-22 ENCOUNTER — Ambulatory Visit (INDEPENDENT_AMBULATORY_CARE_PROVIDER_SITE_OTHER): Payer: Medicare Other | Admitting: Vascular Surgery

## 2017-08-22 ENCOUNTER — Encounter (INDEPENDENT_AMBULATORY_CARE_PROVIDER_SITE_OTHER): Payer: Medicare Other

## 2017-08-22 DIAGNOSIS — I5022 Chronic systolic (congestive) heart failure: Secondary | ICD-10-CM

## 2017-08-22 NOTE — Progress Notes (Signed)
Discharge Progress Report  Patient Details  Name: Edenilson Austad MRN: [id number] Date of Birth: Mar 09, 1933 Referring Provider:     Cardiac Rehab from 06/27/2017 in [facility] Cardiac and Pulmonary Re[facility]  Referring Provider  Sabra Heck       Number of Visits: 36/36  Reason for Discharge:  Patient reached a stable level of exercise. Patient independent in their exercise. Patient has met program and personal goals.  Smoking History:  Social History   Tobacco Use  Smoking Status Former Smoker  . Types: Cigarettes  . Last attempt to quit: 08/31/1984  . Years since quitting: 32.9  Smokeless Tobacco Never Used    Diagnosis:  Heart failure, chronic systolic (HCC)  ADL UCSD:   Initial Exercise Prescription: Initial Exercise Prescription - 06/27/17 1400      Date of Initial Exercise RX and Referring Provider   Date  06/27/17    Referring Provider  Sabra Heck      Treadmill   MPH  2    Grade  0    Minutes  15    METs  2.53      Recumbant Bike   Level  1    RPM  60    Watts  15    Minutes  15    METs  2.5      NuStep   Level  2    SPM  80    Minutes  15    METs  2.5      Prescription Details   Frequency (times per week)  3    Duration  Progress to 45 minutes of aerobic exercise without signs/symptoms of physical distress      Intensity   THRR 40-80% of Max Heartrate  96-124    Ratings of Perceived Exertion  11-13    Perceived Dyspnea  0-4      Resistance Training   Training Prescription  Yes    Weight  3 lb    Reps  10-15       Discharge Exercise Prescription (Final Exercise Prescription Changes): Exercise Prescription Changes - 08/21/17 1200      Response to Exercise   Blood Pressure (Admit)  102/62    Blood Pressure (Exercise)  124/68    Blood Pressure (Exit)  120/60    Heart Rate (Admit)  71 bpm    Heart Rate (Exercise)  112 bpm    Heart Rate (Exit)  67 bpm    Rating of Perceived Exertion (Exercise)  13    Symptoms  none    Duration  Continue with 45  min of aerobic exercise without signs/symptoms of physical distress.    Intensity  THRR unchanged      Progression   Progression  Continue to progress workloads to maintain intensity without signs/symptoms of physical distress.    Average METs  2.53      Resistance Training   Training Prescription  Yes    Weight  3 lb    Reps  10-15      Interval Training   Interval Training  No      Treadmill   MPH  2    Grade  0    Minutes  15    METs  2.53      Recumbant Bike   Level  1    RPM  60    Watts  18    Minutes  15    METs  2.55      Home Exercise Plan  Plans to continue exercise at  [facility] - walk    Frequency  Add 1 additional day to program exercise sessions.    Initial Home Exercises Provided  07/18/17       Functional Capacity: 6 Minute Walk    Row Name 06/27/17 1424 08/19/17 1748       6 Minute Walk   Phase  -  Discharge    Distance  1088 feet  1180 feet    Distance % Change  -  9 %    Distance Feet Change  -  100 ft    Walk Time  6 minutes  6 minutes    # of Rest Breaks  0  0    MPH  2.06  2.25    METS  2.56  2.18    RPE  14  13    Perceived Dyspnea   1  -    VO2 Peak  8.98  7.64    Symptoms  No  Yes (comment)    Comments  -  knee pain    Resting HR  68 bpm  71 bpm    Resting BP  114/66  102/62    Resting Oxygen Saturation   97 %  -    Exercise Oxygen Saturation  during 6 min walk  99 %  -    Max Ex. HR  119 bpm  96 bpm    Max Ex. BP  154/72  124/68    2 Minute Post BP  128/70  -       Psychological, QOL, Others - Outcomes: PHQ 2/9: Depression screen [facility] 2/9 08/19/2017 06/27/2017  Decreased Interest 2 0  Down, Depressed, Hopeless 2 2  PHQ - 2 Score 4 2  Altered sleeping 0 1  Tired, decreased energy 2 1  Change in appetite 0 0  Feeling bad or failure about yourself  0 0  Trouble concentrating 0 0  Moving slowly or fidgety/restless 0 0  Suicidal thoughts 0 0  PHQ-9 Score 6 4  Difficult doing work/chores Not difficult at all Not  difficult at all    Quality of Life: Quality of Life - 08/19/17 1838      Quality of Life Scores   Health/Function Pre  22.29 %    Health/Function Post  18.4 %    Health/Function % Change  -17.45 %    Socioeconomic Pre  30 %    Socioeconomic Post  27.5 %    Socioeconomic % Change   -8.33 %    Psych/Spiritual Pre  22.5 %    Psych/Spiritual Post  16.29 %    Psych/Spiritual % Change  -27.6 %    Family Pre  30 %    Family Post  27.6 %    Family % Change  -8 %    GLOBAL Pre  25.42 %    GLOBAL Post  21 %    GLOBAL % Change  -17.39 %       Personal Goals: Goals established at orientation with interventions provided to work toward goal. Personal Goals and Risk Factors at Admission - 06/27/17 1355      Core Components/Risk Factors/Patient Goals on Admission    Weight Management  Weight Maintenance;Yes    Intervention  Weight Management: Develop a combined nutrition and exercise program designed to reach desired caloric intake, while maintaining appropriate intake of nutrient and fiber, sodium and fats, and appropriate energy expenditure required for the weight goal.;Weight Management: Provide  education and appropriate resources to help participant work on and attain dietary goals.;Weight Management/Obesity: Establish reasonable short term and long term weight goals.    Admit Weight  181 lb (82.1 kg) Weighing with clothes on, usually 174-175 at home. Would like to maintain that weight.    Goal Weight: Short Term  176 lb (79.8 kg)    Goal Weight: Long Term  175 lb (79.4 kg)    Expected Outcomes  Short Term: Continue to assess and modify interventions until short term weight is achieved;Long Term: Adherence to nutrition and physical activity/exercise program aimed toward attainment of established weight goal;Weight Maintenance: Understanding of the daily nutrition guidelines, which includes 25-35% calories from fat, 7% or less cal from saturated fats, less than 241m cholesterol, less than  1.5gm of sodium, & 5 or more servings of fruits and vegetables daily;Understanding recommendations for meals to include 15-35% energy as protein, 25-35% energy from fat, 35-60% energy from carbohydrates, less than 2019mof dietary cholesterol, 20-35 gm of total fiber daily;Understanding of distribution of calorie intake throughout the day with the consumption of 4-5 meals/snacks    Heart Failure  Yes    Intervention  Provide a combined exercise and nutrition program that is supplemented with education, support and counseling about heart failure. Directed toward relieving symptoms such as shortness of breath, decreased exercise tolerance, and extremity edema.    Expected Outcomes  Improve functional capacity of life;Short term: Attendance in program 2-3 days a week with increased exercise capacity. Reported lower sodium intake. Reported increased fruit and vegetable intake. Reports medication compliance.;Short term: Daily weights obtained and reported for increase. Utilizing diuretic protocols set by physician.;Long term: Adoption of self-care skills and reduction of barriers for early signs and symptoms recognition and intervention leading to self-care maintenance.    Hypertension  Yes    Intervention  Provide education on lifestyle modifcations including regular physical activity/exercise, weight management, moderate sodium restriction and increased consumption of fresh fruit, vegetables, and low fat dairy, alcohol moderation, and smoking cessation.;Monitor prescription use compliance.    Expected Outcomes  Short Term: Continued assessment and intervention until BP is < 140/9084mG in hypertensive participants. < 130/59m81m in hypertensive participants with diabetes, heart failure or chronic kidney disease.;Long Term: Maintenance of blood pressure at goal levels.    Stress  Yes Trying to get back to work at [employer] time. He encountered issues with his retirement when he retired early in life, so  financial issues ar ebecoming a stressor.     Intervention  Offer individual and/or small group education and counseling on adjustment to heart disease, stress management and health-related lifestyle change. Teach and support self-help strategies.;Refer participants experiencing significant psychosocial distress to appropriate mental health specialists for further evaluation and treatment. When possible, include family members and significant others in education/counseling sessions.    Expected Outcomes  Short Term: Participant demonstrates changes in health-related behavior, relaxation and other stress management skills, ability to obtain effective social support, and compliance with psychotropic medications if prescribed.;Long Term: Emotional wellbeing is indicated by absence of clinically significant psychosocial distress or social isolation.        Personal Goals Discharge: Goals and Risk Factor Review    Row Name 07/29/17 1831             Core Components/Risk Factors/Patient Goals Review   Personal Goals Review  Heart Failure;Hypertension;Lipids       Review  MichPhilipd his wife is really trying to cook healthy for him.  Donyae's goal is to get 35-40% of his heart function back. Wynne has mentioned many times that he has irregular heart beat because he only has 30% of his heart functiion. Neng said he gets alot out of Cardiac Rehab and will cont his exercise to control his lipids and blood pressure.        Expected Outcomes  Heart healthy lifestyle for his 84 year old man and to improve his heart function.           Exercise Goals and Review: Exercise Goals    Row Name 06/27/17 1423             Exercise Goals   Increase Physical Activity  Yes       Intervention  Provide advice, education, support and counseling about physical activity/exercise needs.;Develop an individualized exercise prescription for aerobic and resistive training based on initial evaluation findings, risk  stratification, comorbidities and participant's personal goals.       Expected Outcomes  Achievement of increased cardiorespiratory fitness and enhanced flexibility, muscular endurance and strength shown through measurements of functional capacity and personal statement of participant.       Increase Strength and Stamina  Yes       Intervention  Provide advice, education, support and counseling about physical activity/exercise needs.;Develop an individualized exercise prescription for aerobic and resistive training based on initial evaluation findings, risk stratification, comorbidities and participant's personal goals.       Expected Outcomes  Achievement of increased cardiorespiratory fitness and enhanced flexibility, muscular endurance and strength shown through measurements of functional capacity and personal statement of participant.       Able to understand and use rate of perceived exertion (RPE) scale  Yes       Intervention  Provide education and explanation on how to use RPE scale       Expected Outcomes  Short Term: Able to use RPE daily in rehab to express subjective intensity level;Long Term:  Able to use RPE to guide intensity level when exercising independently       Able to understand and use Dyspnea scale  Yes       Intervention  Provide education and explanation on how to use Dyspnea scale       Expected Outcomes  Short Term: Able to use Dyspnea scale daily in rehab to express subjective sense of shortness of breath during exertion;Long Term: Able to use Dyspnea scale to guide intensity level when exercising independently       Knowledge and understanding of Target Heart Rate Range (THRR)  Yes       Intervention  Provide education and explanation of THRR including how the numbers were predicted and where they are located for reference       Expected Outcomes  Short Term: Able to state/look up THRR;Short Term: Able to use daily as guideline for intensity in rehab;Long Term: Able to use  THRR to govern intensity when exercising independently       Able to check pulse independently  Yes       Intervention  Review the importance of being able to check your own pulse for safety during independent exercise;Provide education and demonstration on how to check pulse in carotid and radial arteries.       Expected Outcomes  Short Term: Able to explain why pulse checking is important during independent exercise;Long Term: Able to check pulse independently and accurately       Understanding of Exercise Prescription  Yes  Intervention  Provide education, explanation, and written materials on patient's individual exercise prescription       Expected Outcomes  Short Term: Able to explain program exercise prescription;Long Term: Able to explain home exercise prescription to exercise independently          Nutrition & Weight - Outcomes: Pre Biometrics - 06/27/17 1423      Pre Biometrics   Height  6' 0.5" (1.842 m)    Weight  181 lb 9.6 oz (82.4 kg)    Waist Circumference  38.5 inches    Hip Circumference  41.5 inches    Waist to Hip Ratio  0.93 %    BMI (Calculated)  24.28    Single Leg Stand  1.35 seconds      Post Biometrics - 08/19/17 1748       Post  Biometrics   Height  6' 0.5" (1.842 m)    Weight  190 lb 8 oz (86.4 kg)    Waist Circumference  39 inches    Hip Circumference  43 inches    Waist to Hip Ratio  0.91 %    BMI (Calculated)  25.47    Single Leg Stand  2.87 seconds       Nutrition: Nutrition Therapy & Goals - 08/01/17 1132      Nutrition Therapy   Diet  low sodium, basic heart healthy eating    Drug/Food Interactions  Statins/Certain Fruits    Fruits and Vegetables  5 servings/day    Sodium  2200 grams      Personal Nutrition Goals   Nutrition Goal  Continue with current low sodium diet    Personal Goal #2  Eat several servings of fruit daily and include vegetables as much as possible    Additional Goals?  No    Comments  Mr. Hashemi is following a  224m sodium diet [facility] with support from his wife. They are reading food labels and preparing low sodium meals at home. He is not much of a vegetable eater but does eat fruits daily. He reports feeling hungry almost constantly. Discussed basic meal planning including role of protein in helping with satiety, options for low sodium food choices.       Intervention Plan   Intervention  Prescribe, educate and counsel regarding individualized specific dietary modifications aiming towards targeted core components such as weight, hypertension, lipid management, diabetes, heart failure and other comorbidities.;Nutrition handout(s) given to patient.    Expected Outcomes  Short Term Goal: Understand basic principles of dietary content, such as calories, fat, sodium, cholesterol and nutrients.;Short Term Goal: A plan has been developed with personal nutrition goals set during dietitian appointment.;Long Term Goal: Adherence to prescribed nutrition plan.       Nutrition Discharge: Nutrition Assessments - 08/19/17 1837      MEDFICTS Scores   Pre Score  63    Post Score  48    Score Difference  -15       Education Questionnaire Score: Knowledge Questionnaire Score - 08/19/17 1837      Knowledge Questionnaire Score   Pre Score  22/28       Goals reviewed with patient; copy given to patient.

## 2017-08-22 NOTE — Progress Notes (Signed)
Daily Session Note  Patient Details  Name: Justin Mendez MRN: [id number] Date of Birth: 20-Jun-1933 Referring Provider:     Cardiac Rehab from 06/27/2017 in [facility] Cardiac and Pulmonary Rehab  Referring Provider  Sabra Heck      Encounter Date: 08/22/2017  Check In: Session Check In - 08/22/17 1632      Check-In   Location  [facility]-Cardiac & Pulmonary Rehab    Staff Present  Justin Mendez Jaci Carrel, BS, ACSM CEP, Exercise Physiologist;Meredith Sherryll Burger, RN BSN    Supervising physician immediately available to respond to emergencies  See telemetry face sheet for immediately available ER MD    Medication changes reported      No    Fall or balance concerns reported     No    Warm-up and Cool-down  Performed on first and last piece of equipment    Resistance Training Performed  Yes    VAD Patient?  No      Pain Assessment   Currently in Pain?  No/denies          Social History   Tobacco Use  Smoking Status Former Smoker  . Types: Cigarettes  . Last attempt to quit: 08/31/1984  . Years since quitting: 32.9  Smokeless Tobacco Never Used    Goals Met:  Proper associated with RPD/PD & O2 Sat Independence with exercise equipment Exercise tolerated well No report of cardiac concerns or symptoms Strength training completed today  Goals Unmet:  Not Applicable  Comments:  Justin Mendez graduated today from cardiac rehab with 36 sessions completed.  Details of the patient's exercise prescription and what He needs to do in order to continue the prescription and progress were discussed with patient.  Patient was given a copy of prescription and goals.  Patient verbalized understanding.  Justin Mendez plans to continue to exercise by joining [employer].   Dr. Emily Filbert is Medical Director for [facility].

## 2017-08-22 NOTE — Progress Notes (Signed)
Cardiac Individual Treatment Plan  Patient Details  Name: Justin Mendez MRN: [id number] Date of Birth: [birth year] Referring Provider:     Cardiac Rehab from 06/27/2017 in [facility] Cardiac and Pulmonary Rehab  Referring Provider  Sabra Heck      Initial Encounter Date:    Cardiac Rehab from 06/27/2017 in [facility] Cardiac and Pulmonary Rehab  Date  06/27/17  Referring Provider  Sabra Heck      Visit Diagnosis: Heart failure, chronic systolic ([place])  Patient's Home Medications on Admission:  Current Outpatient Medications:  .  aspirin EC 81 MG tablet, Take 81 mg by mouth daily., Disp: , Rfl:  .  atorvastatin (LIPITOR) 80 MG tablet, , Disp: , Rfl: 2 .  cilostazol (PLETAL) 100 MG tablet, Take 100 mg by mouth 2 (two) times daily., Disp: , Rfl:  .  Cinnamon 500 MG capsule, Take 500 mg by mouth daily. , Disp: , Rfl:  .  clopidogrel (PLAVIX) 75 MG tablet, , Disp: , Rfl: 2 .  Co-Enzyme Q-10 30 MG CAPS, Take by mouth daily. , Disp: , Rfl:  .  diltiazem (CARDIZEM CD) 240 MG 24 hr capsule, Take 1 capsule (240 mg total) by mouth daily. (Patient not taking: Reported on 06/27/2017), Disp: 30 capsule, Rfl: 0 .  ELIQUIS 5 MG TABS tablet, , Disp: , Rfl: 11 .  fluticasone (FLONASE) 50 MCG/ACT nasal spray, SHAKE LQ AND U 1 SPR IEN Q 12 H, Disp: , Rfl: 2 .  furosemide (LASIX) 20 MG tablet, Take 1 tablet (20 mg total) by mouth 2 (two) times daily., Disp: 60 tablet, Rfl: 0 .  isosorbide mononitrate (IMDUR) 30 MG 24 hr tablet, , Disp: , Rfl: 2 .  losartan (COZAAR) 25 MG tablet, Take 25 mg by mouth daily., Disp: , Rfl: 3 .  magnesium oxide (MAG-OX) 400 MG tablet, Take by mouth., Disp: , Rfl:  .  metoprolol succinate (TOPROL-XL) 50 MG 24 hr tablet, Take 1 tablet (50 mg total) by mouth daily. Take with or immediately following a meal., Disp: 30 tablet, Rfl: 0 .  metoprolol succinate (TOPROL-XL) 50 MG 24 hr tablet, Take by mouth., Disp: , Rfl:  .  Multiple Vitamin (MULTI-VITAMINS) TABS, Take by mouth., Disp: , Rfl:  .   niacin (NIASPAN) 1000 MG CR tablet, Take 1,000 mg by mouth daily., Disp: , Rfl:  .  nitroGLYCERIN (NITROSTAT) 0.4 MG SL tablet, DIS 1 T UNT FOR CHEST PAIN UTD. MAY TAKE UP TO 3 DOSES, Disp: , Rfl: 0 .  pantoprazole (PROTONIX) 40 MG tablet, Take 40 mg by mouth daily., Disp: , Rfl: 2 .  potassium chloride SA (K-DUR,KLOR-CON) 20 MEQ tablet, Take 2 tablets (40 mEq total) by mouth daily. (Patient not taking: Reported on 06/27/2017), Disp: 30 tablet, Rfl: 0 .  sertraline (ZOLOFT) 25 MG tablet, , Disp: , Rfl: 2 .  spironolactone (ALDACTONE) 25 MG tablet, , Disp: , Rfl: 2 .  traMADol (ULTRAM) 50 MG tablet, Take by mouth every 8 (eight) hours as needed., Disp: , Rfl:   Past Medical History: Past Medical History:  Diagnosis Date  . H/O carotid endarterectomy   . Hearing loss   . High cholesterol   . Hypertension   . Neuropathy   . PAD (peripheral artery disease) (HCC)     Tobacco Use: Social History   Tobacco Use  Smoking Status Former Smoker  . Types: Cigarettes  . Last attempt to quit: 08/31/1984  . Years since quitting: 32.9  Smokeless Tobacco Never Used    Labs:  Recent Review Flowsheet Data    There is no flowsheet data to display.       Exercise Target Goals:    Exercise Program Goal: Individual exercise prescription set using results from initial 6 min walk test and THRR while considering  patient's activity barriers and safety.   Exercise Prescription Goal: Initial exercise prescription builds to 30-45 minutes a day of aerobic activity, 2-3 days per week.  Home exercise guidelines will be given to patient during program as part of exercise prescription that the participant will acknowledge.  Activity Barriers & Risk Stratification: Activity Barriers & Cardiac Risk Stratification - 06/27/17 1415      Activity Barriers & Cardiac Risk Stratification   Activity Barriers  Left Knee Replacement;Right Knee Replacement    Cardiac Risk Stratification  High       6 Minute  Walk: 6 Minute Walk    Row Name 06/27/17 1424 08/19/17 1748       6 Minute Walk   Phase  -  Discharge    Distance  1088 feet  1180 feet    Distance % Change  -  9 %    Distance Feet Change  -  100 ft    Walk Time  6 minutes  6 minutes    # of Rest Breaks  0  0    MPH  2.06  2.25    METS  2.56  2.18    RPE  14  13    Perceived Dyspnea   1  -    VO2 Peak  8.98  7.64    Symptoms  No  Yes (comment)    Comments  -  knee pain    Resting HR  68 bpm  71 bpm    Resting BP  114/66  102/62    Resting Oxygen Saturation   97 %  -    Exercise Oxygen Saturation  during 6 min walk  99 %  -    Max Ex. HR  119 bpm  96 bpm    Max Ex. BP  154/72  124/68    2 Minute Post BP  128/70  -       Oxygen Initial Assessment:   Oxygen Re-Evaluation:   Oxygen Discharge (Final Oxygen Re-Evaluation):   Initial Exercise Prescription: Initial Exercise Prescription - 06/27/17 1400      Date of Initial Exercise RX and Referring Provider   Date  06/27/17    Referring Provider  Sabra Heck      Treadmill   MPH  2    Grade  0    Minutes  15    METs  2.53      Recumbant Bike   Level  1    RPM  60    Watts  15    Minutes  15    METs  2.5      NuStep   Level  2    SPM  80    Minutes  15    METs  2.5      Prescription Details   Frequency (times per week)  3    Duration  Progress to 45 minutes of aerobic exercise without signs/symptoms of physical distress      Intensity   THRR 40-80% of Max Heartrate  96-124    Ratings of Perceived Exertion  11-13    Perceived Dyspnea  0-4      Resistance Training   Training Prescription  Yes  Weight  3 lb    Reps  10-15       Perform Capillary Blood Glucose checks as needed.  Exercise Prescription Changes: Exercise Prescription Changes    Row Name 06/27/17 1400 07/10/17 1200 07/18/17 1600 07/24/17 1100 08/06/17 1100     Response to Exercise   Blood Pressure (Admit)  114/66  122/60  -  112/64  138/62   Blood Pressure (Exercise)  154/72   130/80  -  130/70  -   Blood Pressure (Exit)  128/70  138/72  -  118/70  108/58   Heart Rate (Admit)  81 bpm  81 bpm  -  67 bpm  97 bpm   Heart Rate (Exercise)  119 bpm  92 bpm  -  102 bpm  111 bpm   Heart Rate (Exit)  82 bpm  77 bpm  -  70 bpm  76 bpm   Oxygen Saturation (Admit)  97 %  -  -  -  -   Oxygen Saturation (Exit)  99 %  -  -  -  -   Rating of Perceived Exertion (Exercise)  14  11  -  13  13   Symptoms  -  none  -  none  none   Duration  -  Progress to 45 minutes of aerobic exercise without signs/symptoms of physical distress  -  Progress to 45 minutes of aerobic exercise without signs/symptoms of physical distress  Progress to 45 minutes of aerobic exercise without signs/symptoms of physical distress   Intensity  -  THRR unchanged  -  THRR unchanged  THRR unchanged     Progression   Progression  -  Continue to progress workloads to maintain intensity without signs/symptoms of physical distress.  -  Continue to progress workloads to maintain intensity without signs/symptoms of physical distress.  Continue to progress workloads to maintain intensity without signs/symptoms of physical distress.   Average METs  -  2.3  -  2.45  2.53     Resistance Training   Training Prescription  -  Yes  -  Yes  Yes   Weight  -  3 lb  -  3 lb  3 lb   Reps  -  10-15  -  10-15  10-15     Interval Training   Interval Training  -  No  -  No  No     Treadmill   MPH  -  -  -  -  2   Grade  -  -  -  -  0   Minutes  -  -  -  -  15   METs  -  -  -  -  2.53     Recumbant Bike   Level  -  -  -  1  -   RPM  -  -  -  60  -   Minutes  -  -  -  15  -   METs  -  -  -  2.55  -     NuStep   Level  -  2  -  2  2   SPM  -  80  -  80  80   Minutes  -  15  -  15  15   METs  -  2.3  -  2.4  -     Home Exercise Plan   Plans to continue exercise at  -  -  [place] home - walk  [employer] home - walk  [employer] home - walk   Frequency  -  -  Add 1 additional day to program exercise sessions.  Add 1  additional day to program exercise sessions.  Add 1 additional day to program exercise sessions.   Initial Home Exercises Provided  -  -  07/18/17  07/18/17  07/18/17   Row Name 08/21/17 1200             Response to Exercise   Blood Pressure (Admit)  102/62       Blood Pressure (Exercise)  124/68       Blood Pressure (Exit)  120/60       Heart Rate (Admit)  71 bpm       Heart Rate (Exercise)  112 bpm       Heart Rate (Exit)  67 bpm       Rating of Perceived Exertion (Exercise)  13       Symptoms  none       Duration  Continue with 45 min of aerobic exercise without signs/symptoms of physical distress.       Intensity  THRR unchanged         Progression   Progression  Continue to progress workloads to maintain intensity without signs/symptoms of physical distress.       Average METs  2.53         Resistance Training   Training Prescription  Yes       Weight  3 lb       Reps  10-15         Interval Training   Interval Training  No         Treadmill   MPH  2       Grade  0       Minutes  15       METs  2.53         Recumbant Bike   Level  1       RPM  60       Watts  18       Minutes  15       METs  2.55         Home Exercise Plan   Plans to continue exercise at  [employer] home - walk       Frequency  Add 1 additional day to program exercise sessions.       Initial Home Exercises Provided  07/18/17          Exercise Comments: Exercise Comments    Row Name 07/01/17 1626 07/18/17 1650         Exercise Comments  First full day of exercise!  Patient was oriented to gym and equipment including functions, settings, policies, and procedures.  Patient's individual exercise prescription and treatment plan were reviewed.  All starting workloads were established based on the results of the 6 minute walk test done at initial orientation visit.  The plan for exercise progression was also introduced and progression will be customized based on patient's performance and goals   Reviewed home exercise with pt today.  Pt plans to walk and attend [place] for exercise.  Reviewed THR, pulse, RPE, sign and symptoms, NTG use, and when to call 911 or MD.  Also discussed weather considerations and indoor options.  Pt voiced understanding.         Exercise Goals and Review: Exercise  Goals    Row Name 06/27/17 1423             Exercise Goals   Increase Physical Activity  Yes       Intervention  Provide advice, education, support and counseling about physical activity/exercise needs.;Develop an individualized exercise prescription for aerobic and resistive training based on initial evaluation findings, risk stratification, comorbidities and participant's personal goals.       Expected Outcomes  Achievement of increased cardiorespiratory fitness and enhanced flexibility, muscular endurance and strength shown through measurements of functional capacity and personal statement of participant.       Increase Strength and Stamina  Yes       Intervention  Provide advice, education, support and counseling about physical activity/exercise needs.;Develop an individualized exercise prescription for aerobic and resistive training based on initial evaluation findings, risk stratification, comorbidities and participant's personal goals.       Expected Outcomes  Achievement of increased cardiorespiratory fitness and enhanced flexibility, muscular endurance and strength shown through measurements of functional capacity and personal statement of participant.       Able to understand and use rate of perceived exertion (RPE) scale  Yes       Intervention  Provide education and explanation on how to use RPE scale       Expected Outcomes  Short Term: Able to use RPE daily in rehab to express subjective intensity level;Long Term:  Able to use RPE to guide intensity level when exercising independently       Able to understand and use Dyspnea scale  Yes       Intervention  Provide education and  explanation on how to use Dyspnea scale       Expected Outcomes  Short Term: Able to use Dyspnea scale daily in rehab to express subjective sense of shortness of breath during exertion;Long Term: Able to use Dyspnea scale to guide intensity level when exercising independently       Knowledge and understanding of Target Heart Rate Range (THRR)  Yes       Intervention  Provide education and explanation of THRR including how the numbers were predicted and where they are located for reference       Expected Outcomes  Short Term: Able to state/look up THRR;Short Term: Able to use daily as guideline for intensity in rehab;Long Term: Able to use THRR to govern intensity when exercising independently       Able to check pulse independently  Yes       Intervention  Review the importance of being able to check your own pulse for safety during independent exercise;Provide education and demonstration on how to check pulse in carotid and radial arteries.       Expected Outcomes  Short Term: Able to explain why pulse checking is important during independent exercise;Long Term: Able to check pulse independently and accurately       Understanding of Exercise Prescription  Yes       Intervention  Provide education, explanation, and written materials on patient's individual exercise prescription       Expected Outcomes  Short Term: Able to explain program exercise prescription;Long Term: Able to explain home exercise prescription to exercise independently          Exercise Goals Re-Evaluation : Exercise Goals Re-Evaluation    Row Name 07/10/17 1211 07/18/17 1644 07/24/17 1151 08/06/17 1114       Exercise Goal Re-Evaluation   Exercise Goals Review  Increase Physical Activity;Increase Strength  and Stamina;Able to understand and use rate of perceived exertion (RPE) scale  Increase Physical Activity;Increase Strength and Stamina;Knowledge and understanding of Target Heart Rate Range (THRR);Able to understand and use  rate of perceived exertion (RPE) scale;Able to check pulse independently;Understanding of Exercise Prescription  Increase Physical Activity;Increase Strength and Stamina;Able to understand and use rate of perceived exertion (RPE) scale  Increase Physical Activity;Increase Strength and Stamina;Able to understand and use rate of perceived exertion (RPE) scale;Understanding of Exercise Prescription    Comments  Ronalee Belts is tolerating exercise well. Staff wil continue to monitor.  Ronalee Belts plans to walk at home and join [employer] after [employer].  Ronalee Belts continues to tolerate exercise well.   Staff will continue to monitor and recommend he increase levels on machines.  He has commented he really enjoys the educational aspect of the classes.   Ronalee Belts continues to tolerate exercise well and attends class regularly.  He seems motivated to improve.  Staff will contintue to monitor    Expected Outcomes  Short - Ronalee Belts will continue to attend [place] wil be active on a regular basis and improve overall MET level.  [place] will add 1-2 days to program sessions.  Long - Ronalee Belts will maintain exercise on his own.  Short - Ronalee Belts will continue to attend Long - Ronalee Belts will increase overall MET level  Short - Ronalee Belts will continue to increase workloads Long - Ronalee Belts will improve overall MET level       Discharge Exercise Prescription (Final Exercise Prescription Changes): Exercise Prescription Changes - 08/21/17 1200      Response to Exercise   Blood Pressure (Admit)  102/62    Blood Pressure (Exercise)  124/68    Blood Pressure (Exit)  120/60    Heart Rate (Admit)  71 bpm    Heart Rate (Exercise)  112 bpm    Heart Rate (Exit)  67 bpm    Rating of Perceived Exertion (Exercise)  13    Symptoms  none    Duration  Continue with 45 min of aerobic exercise without signs/symptoms of physical distress.    Intensity  THRR unchanged      Progression   Progression  Continue to progress workloads to maintain intensity without signs/symptoms  of physical distress.    Average METs  2.53      Resistance Training   Training Prescription  Yes    Weight  3 lb    Reps  10-15      Interval Training   Interval Training  No      Treadmill   MPH  2    Grade  0    Minutes  15    METs  2.53      Recumbant Bike   Level  1    RPM  60    Watts  18    Minutes  15    METs  2.55      Home Exercise Plan   Plans to continue exercise at  [employer] home - walk    Frequency  Add 1 additional day to program exercise sessions.    Initial Home Exercises Provided  07/18/17       Nutrition:  Target Goals: Understanding of nutrition guidelines, daily intake of sodium <1535m, cholesterol <2028m calories 30% from fat and 7% or less from saturated fats, daily to have 5 or more servings of fruits and vegetables.  Biometrics: Pre Biometrics - 06/27/17 1423      Pre  Biometrics   Height  6' 0.5" (1.842 m)    Weight  181 lb 9.6 oz (82.4 kg)    Waist Circumference  38.5 inches    Hip Circumference  41.5 inches    Waist to Hip Ratio  0.93 %    BMI (Calculated)  24.28    Single Leg Stand  1.35 seconds      Post Biometrics - 08/19/17 1748       Post  Biometrics   Height  6' 0.5" (1.842 m)    Weight  190 lb 8 oz (86.4 kg)    Waist Circumference  39 inches    Hip Circumference  43 inches    Waist to Hip Ratio  0.91 %    BMI (Calculated)  25.47    Single Leg Stand  2.87 seconds       Nutrition Therapy Plan and Nutrition Goals: Nutrition Therapy & Goals - 08/01/17 1132      Nutrition Therapy   Diet  low sodium, basic heart healthy eating    Drug/Food Interactions  Statins/Certain Fruits    Fruits and Vegetables  5 servings/day    Sodium  2200 grams      Personal Nutrition Goals   Nutrition Goal  Continue with current low sodium diet    Personal Goal #2  Eat several servings of fruit daily and include vegetables as much as possible    Additional Goals?  No    Comments  Mr. Costales is following a 2218m sodium diet [facility] with support from his wife. They are reading food labels and preparing low sodium meals at home. He is not much of a vegetable eater but does eat fruits daily. He reports feeling hungry almost constantly. Discussed basic meal planning including role of protein in helping with satiety, options for low sodium food choices.       Intervention Plan   Intervention  Prescribe, educate and counsel regarding individualized specific dietary modifications aiming towards targeted core components such as weight, hypertension, lipid management, diabetes, heart failure and other comorbidities.;Nutrition handout(s) given to patient.    Expected Outcomes  Short Term Goal: Understand basic principles of dietary content, such as calories, fat, sodium, cholesterol and nutrients.;Short Term Goal: A plan has been developed with personal nutrition goals set during dietitian appointment.;Long Term Goal: Adherence to prescribed nutrition plan.       Nutrition Assessments: Nutrition Assessments - 08/19/17 1837      MEDFICTS Scores   Pre Score  63    Post Score  48    Score Difference  -15       Nutrition Goals Re-Evaluation: Nutrition Goals Re-Evaluation    Row Name 07/29/17 1834             Goals   Expected Outcome  Heart healthy eating - has decreased salt intake.           Nutrition Goals Discharge (Final Nutrition Goals Re-Evaluation): Nutrition Goals Re-Evaluation - 07/29/17 1834      Goals   Expected Outcome  Heart healthy eating - has decreased salt intake.        Psychosocial: Target Goals: Acknowledge presence or absence of significant depression and/or stress, maximize coping skills, provide positive support system. Participant is able to verbalize types and ability to use techniques and skills needed for reducing stress and depression.   Initial Review & Psychosocial Screening: Initial Psych Review & Screening - 06/27/17 1407      Initial  Review   Current  issues with  Current Stress Concerns    Source of Stress Concerns  Financial;Occupation;Unable to perform yard/household activities    Comments  He is trying to get back to work part time at [employer] He works and so does his wife to help with the financial issues he encountered after his retirement funds went [place] due to [employer]. He hates that his wife is having to work, but is looking forward to getting back to his job.       Family Dynamics   Good Support System?  Yes Wife and children      Screening Interventions   Interventions  Yes;Encouraged to exercise    Expected Outcomes  Short Term goal: Utilizing psychosocial counselor, staff and physician to assist with identification of specific Stressors or current issues interfering with healing process. Setting desired goal for each stressor or current issue identified.;Long Term Goal: Stressors or current issues are controlled or eliminated.;Short Term goal: Identification and review with participant of any Quality of Life or Depression concerns found by scoring the questionnaire.;Long Term goal: The participant improves quality of Life and PHQ9 Scores as seen by post scores and/or verbalization of changes       Quality of Life Scores:  Quality of Life - 08/19/17 1838      Quality of Life Scores   Health/Function Pre  22.29 %    Health/Function Post  18.4 %    Health/Function % Change  -17.45 %    Socioeconomic Pre  30 %    Socioeconomic Post  27.5 %    Socioeconomic % Change   -8.33 %    Psych/Spiritual Pre  22.5 %    Psych/Spiritual Post  16.29 %    Psych/Spiritual % Change  -27.6 %    Family Pre  30 %    Family Post  27.6 %    Family % Change  -8 %    GLOBAL Pre  25.42 %    GLOBAL Post  21 %    GLOBAL % Change  -17.39 %      Scores of 19 and below usually indicate a poorer quality of life in these areas.  A difference of  2-3 points is a clinically meaningful difference.  A difference of 2-3 points in the total score  of the Quality of Life Index has been associated with significant improvement in overall quality of life, self-image, physical symptoms, and general health in studies assessing change in quality of life.  PHQ-9: Recent Review Flowsheet Data    Depression screen [facility] 2/9 08/19/2017 06/27/2017   Decreased Interest 2 0   Down, Depressed, Hopeless 2 2   PHQ - 2 Score 4 2   Altered sleeping 0 1   Tired, decreased energy 2 1   Change in appetite 0 0   Feeling bad or failure about yourself  0 0   Trouble concentrating 0 0   Moving slowly or fidgety/restless 0 0   Suicidal thoughts 0 0   PHQ-9 Score 6 4   Difficult doing work/chores Not difficult at all Not difficult at all     Interpretation of Total Score  Total Score Depression Severity:  1-4 = Minimal depression, 5-9 = Mild depression, 10-14 = Moderate depression, 15-19 = Moderately severe depression, 20-27 = Severe depression   Psychosocial Evaluation and Intervention: Psychosocial Evaluation - 07/01/17 1724      Psychosocial Evaluation & Interventions   Interventions  Stress management education;Relaxation  education;Encouraged to exercise with the program and follow exercise prescription    Comments  Counselor met with Mr. Schnorr Ronalee Belts) and his spouse Otilio Saber today for initial psychosocial evaluation.  He is an 84 year old who was diagnosed with CHF 3 months ago and was told he has only 35% of his heart working currently.  Ronalee Belts has a great support system with his spouse of over 62 years and a daughter and son-in-law who live locally.  Ronalee Belts has sleep apnea but has been sleeping well with medications and his CPAP for awhile.  He has a "GREAT" appetite but spouse is having to learn how to cook differently to help him eat more healthy.  Ronalee Belts has had knee replacment surgery 5 years ago.  He denies a history of depression or anxiety and reports he is typically positive most of the time.  Bette reports he can get irritable at times and he agreed as  this latest prognosis with his blockages and being told they are inoperable is discouraging.  Ronalee Belts reports his health and his finances are his primary stressors currently.  He has goals to strengthen his heart to 45% functioning - if possible.  Staff will follow with Ronalee Belts.    Expected Outcomes  Ronalee Belts will benefit from consistent exercise to achieve his stated goals.  The educational and psychoeducational components of this program will be helpful in understanding and coping with his condition in a more positive way.      Continue Psychosocial Services   Follow up required by staff       Psychosocial Re-Evaluation: Psychosocial Re-Evaluation    [place] Name 07/29/17 1833             Psychosocial Re-Evaluation   Current issues with  Current Stress Concerns       Expected Outcomes  Decreased stress. He wants to return to work to help with expenses.        Interventions  Encouraged to attend Cardiac Rehabilitation for the exercise          Psychosocial Discharge (Final Psychosocial Re-Evaluation): Psychosocial Re-Evaluation - 07/29/17 1833      Psychosocial Re-Evaluation   Current issues with  Current Stress Concerns    Expected Outcomes  Decreased stress. He wants to return to work to help with expenses.     Interventions  Encouraged to attend Cardiac Rehabilitation for the exercise       Vocational Rehabilitation: Provide vocational rehab assistance to qualifying candidates.   Vocational Rehab Evaluation & Intervention: Vocational Rehab - 06/27/17 1413      Initial Vocational Rehab Evaluation & Intervention   Assessment shows need for Vocational Rehabilitation  No       Education: Education Goals: Education classes will be provided on a variety of topics geared toward better understanding of heart health and risk factor modification. Participant will state understanding/return demonstration of topics presented as noted by education test scores.  Learning  Barriers/Preferences: Learning Barriers/Preferences - 06/27/17 1412      Learning Barriers/Preferences   Learning Barriers  Hearing    Learning Preferences  Individual Instruction;Skilled Demonstration;Written Material       Education Topics:  AED/CPR: - Group verbal and written instruction with the use of models to demonstrate the basic use of the AED with the basic ABC's of resuscitation.   General Nutrition Guidelines/Fats and Fiber: -Group instruction provided by verbal, written material, models and posters to present the general guidelines for heart healthy nutrition. Gives an explanation and review of  dietary fats and fiber.   Cardiac Rehab from 08/21/2017 in [facility] Cardiac and Pulmonary Rehab  Date  08/12/17  Educator  PI  Instruction Review Code  1- Verbalizes Understanding      Controlling Sodium/Reading Food Labels: -Group verbal and written material supporting the discussion of sodium use in heart healthy nutrition. Review and explanation with models, verbal and written materials for utilization of the food label.   Cardiac Rehab from 08/21/2017 in [facility] Cardiac and Pulmonary Rehab  Date  08/19/17  Educator  PI  Instruction Review Code  1- Verbalizes Understanding      Exercise Physiology & General Exercise Guidelines: - Group verbal and written instruction with models to review the exercise physiology of the cardiovascular system and associated critical values. Provides general exercise guidelines with specific guidelines to those with heart or lung disease.    Cardiac Rehab from 08/21/2017 in [facility] Cardiac and Pulmonary Rehab  Date  07/10/17  Educator  AS  Instruction Review Code  1- Verbalizes Understanding      Aerobic Exercise & Resistance Training: - Gives group verbal and written instruction on the various components of exercise. Focuses on aerobic and resistive training programs and the benefits of this training and how to safely progress through these programs..    Cardiac Rehab from 08/21/2017 in [facility] Cardiac and Pulmonary Rehab  Date  07/17/17  Educator  AS  Instruction Review Code  1- Verbalizes Understanding      Flexibility, Balance, Mind/Body Relaxation: Provides group verbal/written instruction on the benefits of flexibility and balance training, including mind/body exercise modes such as yoga, pilates and tai chi.  Demonstration and skill practice provided.   Cardiac Rehab from 08/21/2017 in [facility] Cardiac and Pulmonary Rehab  Date  07/22/17  Educator  AS  Instruction Review Code  1- Verbalizes Understanding      Stress and Anxiety: - Provides group verbal and written instruction about the health risks of elevated stress and causes of high stress.  Discuss the correlation between heart/lung disease and anxiety and treatment options. Review healthy ways to manage with stress and anxiety.   Cardiac Rehab from 08/21/2017 in [facility] Cardiac and Pulmonary Rehab  Date  08/07/17  Educator  [facility]  Instruction Review Code  1- Verbalizes Understanding      Depression: - Provides group verbal and written instruction on the correlation between heart/lung disease and depressed mood, treatment options, and the stigmas associated with seeking treatment.   Anatomy & Physiology of the Heart: - Group verbal and written instruction and models provide basic cardiac anatomy and physiology, with the coronary electrical and arterial systems. Review of Valvular disease and Heart Failure   Cardiac Rehab from 08/21/2017 in [facility] Cardiac and Pulmonary Rehab  Date  08/05/17  Educator  CE  Instruction Review Code  1- Verbalizes Understanding      Cardiac Procedures: - Group verbal and written instruction to review commonly prescribed medications for heart disease. Reviews the medication, class of the drug, and side effects. Includes the steps to properly store meds and maintain the prescription regimen. (beta blockers and nitrates)   Cardiac Rehab from 08/21/2017 in [facility]  Cardiac and Pulmonary Rehab  Date  08/14/17  Educator  [facility]  Instruction Review Code  1- Verbalizes Understanding      Cardiac Medications I: - Group verbal and written instruction to review commonly prescribed medications for heart disease. Reviews the medication, class of the drug, and side effects. Includes the steps to properly store meds and  maintain the prescription regimen.   Cardiac Rehab from 08/21/2017 in [facility] Cardiac and Pulmonary Rehab  Date  07/29/17 [Part 2 07/31/17]  Educator  [place]  Instruction Review Code  1- Verbalizes Understanding      Cardiac Medications II: -Group verbal and written instruction to review commonly prescribed medications for heart disease. Reviews the medication, class of the drug, and side effects. (all other drug classes)   Cardiac Rehab from 08/21/2017 in [facility] Cardiac and Pulmonary Rehab  Date  07/24/17 [and risk factors]  Educator  [facility]  Instruction Review Code  1- Verbalizes Understanding       Go Sex-Intimacy & Heart Disease, Get SMART - Goal Setting: - Group verbal and written instruction through game format to discuss heart disease and the return to sexual intimacy. Provides group verbal and written material to discuss and apply goal setting through the application of the S.M.A.R.T. Method.   Cardiac Rehab from 08/21/2017 in [facility] Cardiac and Pulmonary Rehab  Date  08/14/17  Educator  [facility]  Instruction Review Code  1- Verbalizes Understanding      Other Matters of the Heart: - Provides group verbal, written materials and models to describe Stable Angina and Peripheral Artery. Includes description of the disease process and treatment options available to the cardiac patient.   Exercise & Equipment Safety: - Individual verbal instruction and demonstration of equipment use and safety with use of the equipment.   Cardiac Rehab from 08/21/2017 in [facility] Cardiac and Pulmonary Rehab  Date  06/27/17  Educator  [facility]  Instruction Review Code  1- Verbalizes  Understanding      Infection Prevention: - Provides verbal and written material to individual with discussion of infection control including proper hand washing and proper equipment cleaning during exercise session.   Cardiac Rehab from 08/21/2017 in [facility] Cardiac and Pulmonary Rehab  Date  06/27/17  Educator  [facility]  Instruction Review Code  1- Verbalizes Understanding      Falls Prevention: - Provides verbal and written material to individual with discussion of falls prevention and safety.   Cardiac Rehab from 08/21/2017 in [facility] Cardiac and Pulmonary Rehab  Date  06/27/17  Educator  [facility]  Instruction Review Code  1- Verbalizes Understanding      Diabetes: - Individual verbal and written instruction to review signs/symptoms of diabetes, desired ranges of glucose level fasting, after meals and with exercise. Acknowledge that pre and post exercise glucose checks will be done for 3 sessions at entry of program.   Know Your Numbers and Risk Factors: -Group verbal and written instruction about important numbers in your health.  Discussion of what are risk factors and how they play a role in the disease process.  Review of Cholesterol, Blood Pressure, Diabetes, and BMI and the role they play in your overall health.   Cardiac Rehab from 08/21/2017 in [facility] Cardiac and Pulmonary Rehab  Date  07/24/17 [and risk factors]  Educator  [facility]  Instruction Review Code  1- Verbalizes Understanding      Sleep Hygiene: -Provides group verbal and written instruction about how sleep can affect your health.  Define sleep hygiene, discuss sleep cycles and impact of sleep habits. Review good sleep hygiene tips.    Cardiac Rehab from 08/21/2017 in [facility] Cardiac and Pulmonary Rehab  Date  08/21/17  Educator  [facility]  Instruction Review Code  1- Verbalizes Understanding      Other: -Provides group and verbal instruction on various topics (see comments)   Knowledge Questionnaire Score:  Knowledge Questionnaire Score -  08/19/17 1837      Knowledge Questionnaire Score   Pre Score  22/28       Core Components/Risk Factors/Patient Goals at Admission: Personal Goals and Risk Factors at Admission - 06/27/17 1355      Core Components/Risk Factors/Patient Goals on Admission    Weight Management  Weight Maintenance;Yes    Intervention  Weight Management: Develop a combined nutrition and exercise program designed to reach desired caloric intake, while maintaining appropriate intake of nutrient and fiber, sodium and fats, and appropriate energy expenditure required for the weight goal.;Weight Management: Provide education and appropriate resources to help participant work on and attain dietary goals.;Weight Management/Obesity: Establish reasonable short term and long term weight goals.    Admit Weight  181 lb (82.1 kg) Weighing with clothes on, usually 174-175 at home. Would like to maintain that weight.    Goal Weight: Short Term  176 lb (79.8 kg)    Goal Weight: Long Term  175 lb (79.4 kg)    Expected Outcomes  Short Term: Continue to assess and modify interventions until short term weight is achieved;Long Term: Adherence to nutrition and physical activity/exercise program aimed toward attainment of established weight goal;Weight Maintenance: Understanding of the daily nutrition guidelines, which includes 25-35% calories from fat, 7% or less cal from saturated fats, less than 246m cholesterol, less than 1.5gm of sodium, & 5 or more servings of fruits and vegetables daily;Understanding recommendations for meals to include 15-35% energy as protein, 25-35% energy from fat, 35-60% energy from carbohydrates, less than 2073mof dietary cholesterol, 20-35 gm of total fiber daily;Understanding of distribution of calorie intake throughout the day with the consumption of 4-5 meals/snacks    Heart Failure  Yes    Intervention  Provide a combined exercise and nutrition program that is supplemented with education, support and  counseling about heart failure. Directed toward relieving symptoms such as shortness of breath, decreased exercise tolerance, and extremity edema.    Expected Outcomes  Improve functional capacity of life;Short term: Attendance in program 2-3 days a week with increased exercise capacity. Reported lower sodium intake. Reported increased fruit and vegetable intake. Reports medication compliance.;Short term: Daily weights obtained and reported for increase. Utilizing diuretic protocols set by physician.;Long term: Adoption of self-care skills and reduction of barriers for early signs and symptoms recognition and intervention leading to self-care maintenance.    Hypertension  Yes    Intervention  Provide education on lifestyle modifcations including regular physical activity/exercise, weight management, moderate sodium restriction and increased consumption of fresh fruit, vegetables, and low fat dairy, alcohol moderation, and smoking cessation.;Monitor prescription use compliance.    Expected Outcomes  Short Term: Continued assessment and intervention until BP is < 140/9064mG in hypertensive participants. < 130/30m16m in hypertensive participants with diabetes, heart failure or chronic kidney disease.;Long Term: Maintenance of blood pressure at goal levels.    Stress  Yes Trying to get back to work at [employer] time. He encountered issues with his retirement when he retired early in life, so financial issues ar ebecoming a stressor.     Intervention  Offer individual and/or small group education and counseling on adjustment to heart disease, stress management and health-related lifestyle change. Teach and support self-help strategies.;Refer participants experiencing significant psychosocial distress to appropriate mental health specialists for further evaluation and treatment. When possible, include family members and significant others in education/counseling sessions.    Expected Outcomes  Short Term:  Participant demonstrates changes  in health-related behavior, relaxation and other stress management skills, ability to obtain effective social support, and compliance with psychotropic medications if prescribed.;Long Term: Emotional wellbeing is indicated by absence of clinically significant psychosocial distress or social isolation.       Core Components/Risk Factors/Patient Goals Review:  Goals and Risk Factor Review    Row Name 07/29/17 1831             Core Components/Risk Factors/Patient Goals Review   Personal Goals Review  Heart Failure;Hypertension;Lipids       Review  Marquist said his wife is really trying to cook healthy for him. Shaden's goal is to get 35-40% of his heart function back. Hale has mentioned many times that he has irregular heart beat because he only has 30% of his heart functiion. Malcome said he gets alot out of Cardiac Rehab and will cont his exercise to control his lipids and blood pressure.        Expected Outcomes  Heart healthy lifestyle for his 84 year old man and to improve his heart function.           Core Components/Risk Factors/Patient Goals at Discharge (Final Review):  Goals and Risk Factor Review - 07/29/17 1831      Core Components/Risk Factors/Patient Goals Review   Personal Goals Review  Heart Failure;Hypertension;Lipids    Review  Jazon said his wife is really trying to cook healthy for him. Clearance's goal is to get 35-40% of his heart function back. Cillian has mentioned many times that he has irregular heart beat because he only has 30% of his heart functiion. Merced said he gets alot out of Cardiac Rehab and will cont his exercise to control his lipids and blood pressure.     Expected Outcomes  Heart healthy lifestyle for his 84 year old man and to improve his heart function.        ITP Comments: ITP Comments    Row Name 06/27/17 1338 07/11/17 0712 07/17/17 1022 07/29/17 1831 08/14/17 0628   ITP Comments  Med Review completed.  Initial ITP created. Diagnosis can be found in [facility] 04/26/17   Kathleene Hazel age 84 last evening in Cardiac Rehab had multiple PVCs. He said he was more tired than usual yesterday during the day. The only caffeine he had was hot tea. Laban reported that you started him on Magnesium tablets about 4 days ago. Reported to his primary care MD Dr. Sabra Heck by fax and email via [facility].  30 day review. Continue with ITP unless directed changes per Medical Director review.  New to program  Raiford said he only has 30% of his heart and DR. Sabra Heck wants him to cont exercising even to join the [employer]	[place] class after he completes Cardiac rehab.   30 Day review. Continue with ITP unless directed changes per Medical Director review.    [place] Name 08/22/17 1633           ITP Comments  Discharge ITP sent and signed by Dr. Sabra Heck.  Discharge Summary routed to PCP and cardiologist.          Comments: Discharge ITP

## 2017-09-23 ENCOUNTER — Ambulatory Visit (INDEPENDENT_AMBULATORY_CARE_PROVIDER_SITE_OTHER): Payer: Medicare Other

## 2017-09-23 ENCOUNTER — Encounter (INDEPENDENT_AMBULATORY_CARE_PROVIDER_SITE_OTHER): Payer: Self-pay | Admitting: Vascular Surgery

## 2017-09-23 ENCOUNTER — Ambulatory Visit (INDEPENDENT_AMBULATORY_CARE_PROVIDER_SITE_OTHER): Payer: Medicare Other | Admitting: Vascular Surgery

## 2017-09-23 VITALS — BP 104/61 | HR 73 | Resp 15 | Ht 72.0 in | Wt 193.0 lb

## 2017-09-23 DIAGNOSIS — I6523 Occlusion and stenosis of bilateral carotid arteries: Secondary | ICD-10-CM

## 2017-09-23 DIAGNOSIS — I739 Peripheral vascular disease, unspecified: Secondary | ICD-10-CM

## 2017-09-23 DIAGNOSIS — E782 Mixed hyperlipidemia: Secondary | ICD-10-CM | POA: Diagnosis not present

## 2017-09-23 DIAGNOSIS — I25118 Atherosclerotic heart disease of native coronary artery with other forms of angina pectoris: Secondary | ICD-10-CM | POA: Diagnosis not present

## 2017-09-23 DIAGNOSIS — I10 Essential (primary) hypertension: Secondary | ICD-10-CM | POA: Diagnosis not present

## 2017-09-23 NOTE — Progress Notes (Signed)
MRN : [id number]  Arville Postlewaite is a 84 y.o. (07/27/1932) male who presents with chief complaint of No chief complaint on file. Marland Kitchen  History of Present Illness:   The patient is transitioning to our care from [facility].  The patient states he was being seen every 6 months and undergoing ultrasounds for both his carotid and peripheral artery disease.  The patient is status post a right carotid endarterectomy in 2003  The carotid stenosis and PAD has been followed by ultrasound.   The patient denies amaurosis fugax. There is no recent history of TIA symptoms or focal motor deficits. There is no prior documented CVA.  The patient is taking enteric-coated aspirin 81 mg daily.  There is no history of migraine headaches. There is no history of seizures.  There have been no interval changes in lower extremity symptoms. No interval shortening of the patient's claudication distance or development of rest pain symptoms. No new ulcers or wounds have occurred since the last visit.  There have been no significant changes to the patient's overall health care.  The patient denies amaurosis fugax or recent TIA symptoms. There are no recent neurological changes noted. The patient denies history of DVT, PE or superficial thrombophlebitis. The patient denies recent episodes of angina or shortness of breath.     No outpatient medications have been marked as taking for the 09/23/17 encounter (Appointment) with Gilda Crease, Latina Craver, MD.    Past Medical History:  Diagnosis Date  . H/O carotid endarterectomy   . Hearing loss   . High cholesterol   . Hypertension   . Neuropathy   . PAD (peripheral artery disease) (HCC)     Past Surgical History:  Procedure Laterality Date  . carotid endarderectomy     Bilateral  . KNEE SURGERY     Left  . NECK SURGERY    . RIGHT/LEFT HEART CATH AND CORONARY ANGIOGRAPHY N/A 04/29/2017   Procedure: RIGHT/LEFT HEART CATH AND CORONARY ANGIOGRAPHY;  Surgeon: Marcina Millard, MD;  Location: [facility] INVASIVE CV LAB;  Service: Cardiovascular;  Laterality: N/A;    Social History Social History   Tobacco Use  . Smoking status: Former Smoker    Types: Cigarettes    Last attempt to quit: 08/31/1984    Years since quitting: 33.0  . Smokeless tobacco: Never Used  Substance Use Topics  . Alcohol use: No  . Drug use: No    Family History Family History  Problem Relation Age of Onset  . Lung cancer Father     Allergies  Allergen Reactions  . Percocet [Oxycodone-Acetaminophen] Other (See Comments)    Confusion/hallucinations  . Iodinated Diagnostic Agents Other (See Comments)     Was very "flushed", denies itching, swelling or hives. He states this was over 30 years ago with Ionic contrast and not the current non-ionic contrast. SPM     REVIEW OF SYSTEMS (Negative unless checked)  Constitutional: [] Weight loss  [] Fever  [] Chills Cardiac: [] Chest pain   [] Chest pressure   [] Palpitations   [] Shortness of breath when laying flat   [] Shortness of breath with exertion. Vascular:  [] Pain in legs with walking   [] Pain in legs at rest  [] History of DVT   [] Phlebitis   [] Swelling in legs   [] Varicose veins   [] Non-healing ulcers Pulmonary:   [] Uses home oxygen   [] Productive cough   [] Hemoptysis   [] Wheeze  [] COPD   [] Asthma Neurologic:  [] Dizziness   [] Seizures   [] History of stroke   []   History of TIA  [] Aphasia   [] Vissual changes   [] Weakness or numbness in arm   [] Weakness or numbness in leg Musculoskeletal:   [] Joint swelling   [] Joint pain   [] Low back pain Hematologic:  [] Easy bruising  [] Easy bleeding   [] Hypercoagulable state   [] Anemic Gastrointestinal:  [] Diarrhea   [] Vomiting  [] Gastroesophageal reflux/heartburn   [] Difficulty swallowing. Genitourinary:  [] Chronic kidney disease   [] Difficult urination  [] Frequent urination   [] Blood in urine Skin:  [] Rashes   [] Ulcers  Psychological:  [] History of anxiety   []  History of major  depression.  Physical Examination  There were no vitals filed for this visit. There is no height or weight on file to calculate BMI. Gen: WD/WN, NAD Head: [place]/AT, No temporalis wasting.  Ear/Nose/Throat: Hearing grossly intact, nares w/o erythema or drainage Eyes: PER, EOMI, sclera nonicteric.  Neck: Supple, no large masses.   Pulmonary:  Good air movement, no audible wheezing bilaterally, no use of accessory muscles.  Cardiac: RRR, no JVD Vascular:  Vessel Right Left  Radial Palpable Palpable  PT Not Palpable Not Palpable  DP Not Palpable Not Palpable  Gastrointestinal: Non-distended. No guarding/no peritoneal signs.  Musculoskeletal: M/S 5/5 throughout.  No deformity or atrophy.  Neurologic: CN 2-12 intact. Symmetrical.  Speech is fluent. Motor exam as listed above. Psychiatric: Judgment intact, Mood & affect appropriate for pt's clinical situation. Dermatologic: No rashes or ulcers noted.  No changes consistent with cellulitis. Lymph : No lichenification or skin changes of chronic lymphedema.  CBC Lab Results  Component Value Date   WBC 10.1 04/29/2017   HGB 15.8 04/29/2017   HCT 46.1 04/29/2017   MCV 89.6 04/29/2017   PLT 268 04/29/2017    BMET    Component Value Date/Time   NA 140 04/29/2017 0515   K 3.4 (L) 04/29/2017 0515   CL 103 04/29/2017 0515   CO2 26 04/29/2017 0515   GLUCOSE 112 (H) 04/29/2017 0515   BUN 16 04/29/2017 0515   CREATININE 1.15 04/29/2017 0515   CALCIUM 9.7 04/29/2017 0515   GFRNONAA 57 (L) 04/29/2017 0515   GFRAA >60 04/29/2017 0515   CrCl cannot be calculated (Patient's most recent lab result is older than the maximum 21 days allowed.).  COAG Lab Results  Component Value Date   INR 1.17 04/26/2017   INR 1.17 09/01/2011    Radiology No results found.   Assessment/Plan 1. Bilateral carotid artery stenosis Recommend:  Given the patient's asymptomatic subcritical stenosis no further invasive testing or surgery at this  time.  Continue antiplatelet therapy as prescribed Continue management of CAD, HTN and Hyperlipidemia Healthy heart diet,  encouraged exercise at least 4 times per week  2. Peripheral arterial disease (HCC)  Recommend:  The patient has evidence of atherosclerosis of the lower extremities with claudication.  The patient does not voice lifestyle limiting changes at this point in time.  Noninvasive studies do not suggest clinically significant change.  No invasive studies, angiography or surgery at this time The patient should continue walking and begin a more formal exercise program.  The patient should continue antiplatelet therapy and aggressive treatment of the lipid abnormalities  No changes in the patient's medications at this time  The patient should continue wearing graduated compression socks 10-15 mmHg strength to control the mild edema.   3. Essential hypertension Continue antihypertensive medications as already ordered, these medications have been reviewed and there are no changes at this time.   4. Atherosclerosis of native coronary artery  of native heart with stable angina pectoris (HCC) Continue cardiac and antihypertensive medications as already ordered and reviewed, no changes at this time.  Continue statin as ordered and reviewed, no changes at this time  Nitrates PRN for chest pain   5. Combined hyperlipidemia Continue statin as ordered and reviewed, no changes at this time     Levora DredgeGregory Schnier, MD  09/23/2017 1:41 PM

## 2017-09-24 ENCOUNTER — Encounter (INDEPENDENT_AMBULATORY_CARE_PROVIDER_SITE_OTHER): Payer: Self-pay | Admitting: Vascular Surgery

## 2017-12-30 ENCOUNTER — Other Ambulatory Visit: Payer: Self-pay

## 2017-12-30 ENCOUNTER — Emergency Department: Payer: Medicare Other

## 2017-12-30 ENCOUNTER — Encounter: Payer: Self-pay | Admitting: Emergency Medicine

## 2017-12-30 ENCOUNTER — Inpatient Hospital Stay
Admission: EM | Admit: 2017-12-30 | Discharge: 2018-01-04 | DRG: 243 | Disposition: A | Discharge status: Home / Self Care | Payer: Medicare Other | Attending: Internal Medicine | Admitting: Internal Medicine

## 2017-12-30 DIAGNOSIS — G4730 Sleep apnea, unspecified: Secondary | ICD-10-CM | POA: Diagnosis present

## 2017-12-30 DIAGNOSIS — Z950 Presence of cardiac pacemaker: Secondary | ICD-10-CM

## 2017-12-30 DIAGNOSIS — Z87891 Personal history of nicotine dependence: Secondary | ICD-10-CM

## 2017-12-30 DIAGNOSIS — I455 Other specified heart block: Secondary | ICD-10-CM | POA: Diagnosis not present

## 2017-12-30 DIAGNOSIS — Z9989 Dependence on other enabling machines and devices: Secondary | ICD-10-CM

## 2017-12-30 DIAGNOSIS — Z79899 Other long term (current) drug therapy: Secondary | ICD-10-CM

## 2017-12-30 DIAGNOSIS — I248 Other forms of acute ischemic heart disease: Secondary | ICD-10-CM | POA: Diagnosis present

## 2017-12-30 DIAGNOSIS — Z7982 Long term (current) use of aspirin: Secondary | ICD-10-CM | POA: Diagnosis not present

## 2017-12-30 DIAGNOSIS — I110 Hypertensive heart disease with heart failure: Secondary | ICD-10-CM | POA: Diagnosis present

## 2017-12-30 DIAGNOSIS — I459 Conduction disorder, unspecified: Secondary | ICD-10-CM | POA: Diagnosis present

## 2017-12-30 DIAGNOSIS — I482 Chronic atrial fibrillation: Secondary | ICD-10-CM | POA: Diagnosis present

## 2017-12-30 DIAGNOSIS — I252 Old myocardial infarction: Secondary | ICD-10-CM | POA: Diagnosis not present

## 2017-12-30 DIAGNOSIS — I6523 Occlusion and stenosis of bilateral carotid arteries: Secondary | ICD-10-CM | POA: Diagnosis present

## 2017-12-30 DIAGNOSIS — Z66 Do not resuscitate: Secondary | ICD-10-CM | POA: Diagnosis present

## 2017-12-30 DIAGNOSIS — Z885 Allergy status to narcotic agent status: Secondary | ICD-10-CM | POA: Diagnosis not present

## 2017-12-30 DIAGNOSIS — K219 Gastro-esophageal reflux disease without esophagitis: Secondary | ICD-10-CM | POA: Diagnosis present

## 2017-12-30 DIAGNOSIS — R402413 Glasgow coma scale score 13-15, at hospital admission: Secondary | ICD-10-CM | POA: Diagnosis present

## 2017-12-30 DIAGNOSIS — I2510 Atherosclerotic heart disease of native coronary artery without angina pectoris: Secondary | ICD-10-CM | POA: Diagnosis present

## 2017-12-30 DIAGNOSIS — I350 Nonrheumatic aortic (valve) stenosis: Secondary | ICD-10-CM | POA: Diagnosis present

## 2017-12-30 DIAGNOSIS — I739 Peripheral vascular disease, unspecified: Secondary | ICD-10-CM | POA: Diagnosis present

## 2017-12-30 DIAGNOSIS — G629 Polyneuropathy, unspecified: Secondary | ICD-10-CM | POA: Diagnosis present

## 2017-12-30 DIAGNOSIS — I5022 Chronic systolic (congestive) heart failure: Secondary | ICD-10-CM | POA: Diagnosis present

## 2017-12-30 DIAGNOSIS — Z7902 Long term (current) use of antithrombotics/antiplatelets: Secondary | ICD-10-CM

## 2017-12-30 DIAGNOSIS — Z7901 Long term (current) use of anticoagulants: Secondary | ICD-10-CM | POA: Diagnosis not present

## 2017-12-30 DIAGNOSIS — H9190 Unspecified hearing loss, unspecified ear: Secondary | ICD-10-CM | POA: Diagnosis present

## 2017-12-30 DIAGNOSIS — I441 Atrioventricular block, second degree: Principal | ICD-10-CM | POA: Diagnosis present

## 2017-12-30 DIAGNOSIS — Z91041 Radiographic dye allergy status: Secondary | ICD-10-CM

## 2017-12-30 DIAGNOSIS — I420 Dilated cardiomyopathy: Secondary | ICD-10-CM | POA: Diagnosis present

## 2017-12-30 DIAGNOSIS — E782 Mixed hyperlipidemia: Secondary | ICD-10-CM | POA: Diagnosis present

## 2017-12-30 DIAGNOSIS — N179 Acute kidney failure, unspecified: Secondary | ICD-10-CM | POA: Diagnosis present

## 2017-12-30 LAB — TROPONIN I
TROPONIN I: 0.03 ng/mL — AB (ref <0.03)
Troponin I: <0.03 ng/mL (ref <0.03)

## 2017-12-30 LAB — CBC
HEMATOCRIT: 42.1 % (ref 40.0–52.0)
HEMOGLOBIN: 14.7 g/dL (ref 13.0–18.0)
MCH: 32.2 pg (ref 26.0–34.0)
MCHC: 35 g/dL (ref 32.0–36.0)
MCV: 92.1 fL (ref 80.0–100.0)
Platelets: 219 10*3/uL (ref 150–440)
RBC: 4.57 MIL/uL (ref 4.40–5.90)
RDW: 13.4 % (ref 11.5–14.5)
WBC: 9.8 10*3/uL (ref 3.8–10.6)

## 2017-12-30 LAB — BASIC METABOLIC PANEL
ANION GAP: 9 (ref 5–15)
BUN: 35 mg/dL — ABNORMAL HIGH (ref 6–20)
CALCIUM: 9.7 mg/dL (ref 8.9–10.3)
CO2: 22 mmol/L (ref 22–32)
Chloride: 106 mmol/L (ref 101–111)
Creatinine, Ser: 1.58 mg/dL — ABNORMAL HIGH (ref 0.61–1.24)
GFR calc Af Amer: 45 mL/min — ABNORMAL LOW (ref >60)
GFR, EST NON AFRICAN AMERICAN: 39 mL/min — AB (ref >60)
Glucose, Bld: 131 mg/dL — ABNORMAL HIGH (ref 65–99)
POTASSIUM: 4.9 mmol/L (ref 3.5–5.1)
SODIUM: 137 mmol/L (ref 135–145)

## 2017-12-30 LAB — GLUCOSE, CAPILLARY: Glucose-Capillary: 111 mg/dL — ABNORMAL HIGH (ref 65–99)

## 2017-12-30 LAB — MRSA PCR SCREENING: MRSA by PCR: NEGATIVE

## 2017-12-30 MED ORDER — CO-ENZYME Q-10 30 MG PO CAPS: ORAL_CAPSULE | Freq: Every day | ORAL | Status: DC

## 2017-12-30 MED ORDER — TRAMADOL HCL 50 MG PO TABS
50.0000 mg | ORAL_TABLET | Freq: Three times a day (TID) | ORAL | Status: DC | PRN
  Administered 2018-01-03 – 2018-01-04 (×2): 50 mg via ORAL
  Filled 2017-12-30 (×2): qty 1

## 2017-12-30 MED ORDER — ISOSORBIDE MONONITRATE ER 30 MG PO TB24
30.0000 mg | ORAL_TABLET | Freq: Every day | ORAL | Status: DC
  Administered 2017-12-31 – 2018-01-04 (×5): 30 mg via ORAL
  Filled 2017-12-30 (×5): qty 1

## 2017-12-30 MED ORDER — LOSARTAN POTASSIUM 25 MG PO TABS
25.0000 mg | ORAL_TABLET | Freq: Every day | ORAL | Status: DC
  Administered 2017-12-31: 25 mg via ORAL
  Filled 2017-12-30: qty 1

## 2017-12-30 MED ORDER — POTASSIUM CHLORIDE CRYS ER 20 MEQ PO TBCR
40.0000 meq | EXTENDED_RELEASE_TABLET | Freq: Every day | ORAL | Status: DC
  Administered 2017-12-31: 40 meq via ORAL
  Filled 2017-12-30: qty 2

## 2017-12-30 MED ORDER — ONDANSETRON HCL 4 MG/2ML IJ SOLN: 4.0000 mg | Freq: Four times a day (QID) | INTRAMUSCULAR | Status: DC | PRN

## 2017-12-30 MED ORDER — ACETAMINOPHEN 650 MG RE SUPP: 650.0000 mg | Freq: Four times a day (QID) | RECTAL | Status: DC | PRN

## 2017-12-30 MED ORDER — GLUCAGON HCL RDNA (DIAGNOSTIC) 1 MG IJ SOLR
5.0000 mg | Freq: Once | INTRAVENOUS | Status: DC
  Filled 2017-12-30: qty 5

## 2017-12-30 MED ORDER — CLOPIDOGREL BISULFATE 75 MG PO TABS: 75.0000 mg | ORAL_TABLET | Freq: Every day | ORAL | Status: DC

## 2017-12-30 MED ORDER — NIACIN ER (ANTIHYPERLIPIDEMIC) 500 MG PO TBCR
1000.0000 mg | EXTENDED_RELEASE_TABLET | Freq: Every day | ORAL | Status: DC
  Administered 2017-12-31 – 2018-01-04 (×5): 1000 mg via ORAL
  Filled 2017-12-30 (×6): qty 2

## 2017-12-30 MED ORDER — IPRATROPIUM BROMIDE 0.06 % NA SOLN
2.0000 | Freq: Three times a day (TID) | NASAL | Status: DC
  Administered 2017-12-31 – 2018-01-04 (×5): 2 via NASAL
  Filled 2017-12-30: qty 15

## 2017-12-30 MED ORDER — SERTRALINE HCL 50 MG PO TABS
25.0000 mg | ORAL_TABLET | Freq: Every day | ORAL | Status: DC
  Administered 2017-12-31 – 2018-01-04 (×5): 25 mg via ORAL
  Filled 2017-12-30 (×5): qty 1

## 2017-12-30 MED ORDER — ACETAMINOPHEN 325 MG PO TABS
650.0000 mg | ORAL_TABLET | Freq: Four times a day (QID) | ORAL | Status: DC | PRN
  Administered 2018-01-03: 650 mg via ORAL
  Filled 2017-12-30: qty 2

## 2017-12-30 MED ORDER — PANTOPRAZOLE SODIUM 40 MG PO TBEC
40.0000 mg | DELAYED_RELEASE_TABLET | Freq: Every day | ORAL | Status: DC
  Administered 2017-12-31 – 2018-01-04 (×5): 40 mg via ORAL
  Filled 2017-12-30 (×5): qty 1

## 2017-12-30 MED ORDER — ATROPINE SULFATE 1 MG/10ML IJ SOSY
0.5000 mg | PREFILLED_SYRINGE | INTRAMUSCULAR | Status: DC | PRN
  Administered 2017-12-30: 0.5 mg via INTRAVENOUS
  Filled 2017-12-30: qty 10

## 2017-12-30 MED ORDER — CINNAMON 500 MG PO CAPS: 500.0000 mg | ORAL_CAPSULE | Freq: Every day | ORAL | Status: DC

## 2017-12-30 MED ORDER — ASPIRIN EC 81 MG PO TBEC
81.0000 mg | DELAYED_RELEASE_TABLET | Freq: Every day | ORAL | Status: DC
  Administered 2017-12-31 – 2018-01-04 (×5): 81 mg via ORAL
  Filled 2017-12-30 (×5): qty 1

## 2017-12-30 MED ORDER — APIXABAN 2.5 MG PO TABS: 2.5000 mg | ORAL_TABLET | Freq: Two times a day (BID) | ORAL | Status: DC

## 2017-12-30 MED ORDER — ATORVASTATIN CALCIUM 20 MG PO TABS
80.0000 mg | ORAL_TABLET | Freq: Every day | ORAL | Status: DC
  Administered 2017-12-30 – 2018-01-01 (×3): 80 mg via ORAL
  Filled 2017-12-30 (×3): qty 4

## 2017-12-30 MED ORDER — MAGNESIUM OXIDE 400 (241.3 MG) MG PO TABS
400.0000 mg | ORAL_TABLET | Freq: Every day | ORAL | Status: DC
Start: 2017-12-30 — End: 2018-01-04
  Administered 2017-12-31 – 2018-01-04 (×5): 400 mg via ORAL
  Filled 2017-12-30 (×5): qty 1

## 2017-12-30 MED ORDER — FUROSEMIDE 20 MG PO TABS
20.0000 mg | ORAL_TABLET | Freq: Two times a day (BID) | ORAL | Status: DC
  Administered 2017-12-30 – 2017-12-31 (×2): 20 mg via ORAL
  Filled 2017-12-30: qty 1

## 2017-12-30 MED ORDER — NITROGLYCERIN 0.4 MG SL SUBL: 0.4000 mg | SUBLINGUAL_TABLET | SUBLINGUAL | Status: DC | PRN

## 2017-12-30 MED ORDER — ONDANSETRON HCL 4 MG PO TABS: 4.0000 mg | ORAL_TABLET | Freq: Four times a day (QID) | ORAL | Status: DC | PRN

## 2017-12-30 MED ORDER — DILTIAZEM HCL ER COATED BEADS 120 MG PO CP24: 240.0000 mg | ORAL_CAPSULE | Freq: Every day | ORAL | Status: DC

## 2017-12-30 MED ORDER — FLUTICASONE PROPIONATE 50 MCG/ACT NA SUSP
2.0000 | Freq: Every day | NASAL | Status: DC
  Administered 2017-12-31: 2 via NASAL
  Filled 2017-12-30: qty 16

## 2017-12-30 MED ORDER — CILOSTAZOL 100 MG PO TABS
100.0000 mg | ORAL_TABLET | Freq: Two times a day (BID) | ORAL | Status: DC
  Administered 2017-12-30 – 2018-01-01 (×4): 100 mg via ORAL
  Filled 2017-12-30 (×6): qty 1

## 2017-12-30 MED ORDER — SPIRONOLACTONE 25 MG PO TABS
25.0000 mg | ORAL_TABLET | Freq: Every day | ORAL | Status: DC
  Administered 2017-12-31 – 2018-01-04 (×5): 25 mg via ORAL
  Filled 2017-12-30 (×5): qty 1

## 2017-12-30 NOTE — Progress Notes (Signed)
PHARMACIST - PHYSICIAN ORDER COMMUNICATION  CONCERNING: P&T Medication Policy on Herbal Medications  DESCRIPTION:  This patient's order for: Cinnamon , Co Q-10  has been noted.  This product(s) is classified as an "herbal" or natural product. Due to a lack of definitive safety studies or FDA approval, nonstandard manufacturing practices, plus the potential risk of unknown drug-drug interactions while on inpatient medications, the Pharmacy and Therapeutics Committee does not permit the use of "herbal" or natural products of this type within [facility].   ACTION TAKEN: The pharmacy department is unable to verify this order at this time and your patient has been informed of this safety policy. Please reevaluate patient's clinical condition at discharge and address if the herbal or natural product(s) should be resumed at that time.

## 2017-12-30 NOTE — Progress Notes (Signed)
Called by Administrative Coordinator to evaluate patient that recently arrived from ED to 2A. Arrived in room and patient alert and lying in bed. Patient reports no pain or distress and is alert and oriented, but heart rate 31. Other vital signs BP 156/52, MAP 79, RR 15, O2 saturation 95% on room air. Uncertain about heart rhythm on smaller monitor, appears like SB or Second Degree type 2. 2A RN, Dorna MaiJancy, notified MD about concerns about keeping patient on 2A and MD ordered patient transferred to stepdown. This RN stayed with patient during transfer.

## 2017-12-30 NOTE — Progress Notes (Signed)
Family Meeting Note  Advance Directive:yes  Today a meeting took place with the Patient, spouse, daughter and son-in-law at bedside   The following clinical team members were present during this meeting:MD  The following were discussed:Patient's diagnosis: Near syncope, sinus bradycardia, second-degree heart block, treatment plan of care, other comorbidities including chronic atrial for ablation, hyperlipidemia, hypertension, hyperlipidemia, peripheral artery disease I discussed in detail with the patient and family members at bedside.  They are verbalizes understanding    patient's progosis: Unable to determine and Goals for treatment: DNR, daughter Delrae SawyersLorraine Harris is a healthcare POA  Additional follow-up to be provided: Hospitalist, cardiology  Time spent during discussion:18 MIN  Ramonita LabAruna Nickalas Mccarrick, MD

## 2017-12-30 NOTE — ED Triage Notes (Signed)
SOB x 1 1/2 hours. History of a fib and CHF. Dizzy x 3 to 4 days ago.

## 2017-12-30 NOTE — ED Provider Notes (Signed)
[facility] Emergency Department Provider Note ____________________________________________   First MD Initiated Contact with Patient 12/30/17 1419     (approximate)  I have reviewed the triage vital signs and the nursing notes.   HISTORY  Chief Complaint Shortness of Breath  HPI Justin Mendez is a 84 y.o. male with a history of peripheral arterial disease, atrial fibrillation and CHF on aspirin, Plavix diltiazem and metoprolol who is presenting to the emergency department today with weakness.  The patient also says that he took his heart rate this morning and it was 28-32.  The patient drove himself to work at [employer] and then drove himself to the hospital because of difficulty standing at work.  He says that he is also having shortness of breath with exertion today but denies any chest pain.  No recent medication changes.  Patient is on both diltiazem and metoprolol per his medication list.  Past Medical History:  Diagnosis Date  . H/O carotid endarterectomy   . Hearing loss   . High cholesterol   . Hypertension   . Neuropathy   . PAD (peripheral artery disease) [facility])     Patient Active Problem List   Diagnosis Date Noted  . Bilateral carotid artery stenosis 07/17/2017  . Hereditary and idiopathic peripheral neuropathy 06/27/2017  . Atherosclerosis of native coronary artery of native heart 05/07/2017  . Acute on chronic systolic congestive heart failure (HCC) 04/29/2017  . Atrial fibrillation with RVR (HCC) 04/26/2017  . Moderate aortic stenosis 10/24/2016  . S/P revision of total knee, right 05/31/2016  . BPH (benign prostatic hyperplasia) 05/14/2016  . History of DVT of lower extremity 05/14/2016  . Peripheral arterial disease (HCC) 05/14/2016  . Combined hyperlipidemia 09/29/2014  . Essential hypertension 09/29/2014  . Nodular prostate with urinary obstruction 04/07/2012  . FH: carotid endarterectomy 12/24/2011    Past Surgical History:   Procedure Laterality Date  . carotid endarderectomy     Bilateral  . KNEE SURGERY     Left  . NECK SURGERY    . RIGHT/LEFT HEART CATH AND CORONARY ANGIOGRAPHY N/A 04/29/2017   Procedure: RIGHT/LEFT HEART CATH AND CORONARY ANGIOGRAPHY;  Surgeon: Marcina Millard, MD;  Location: [facility] INVASIVE CV LAB;  Service: Cardiovascular;  Laterality: N/A;    Prior to Admission medications   Medication Sig Start Date End Date Taking? Authorizing Provider  aspirin EC 81 MG tablet Take 81 mg by mouth daily.    [provider]  atorvastatin (LIPITOR) 80 MG tablet  05/04/17   [provider]  cilostazol (PLETAL) 100 MG tablet Take 100 mg by mouth 2 (two) times daily.    [provider]  Cinnamon 500 MG capsule Take 500 mg by mouth daily.     [provider]  clopidogrel (PLAVIX) 75 MG tablet  05/04/17   [provider]  Co-Enzyme Q-10 30 MG CAPS Take by mouth daily.     [provider]  diltiazem (CARDIZEM CD) 240 MG 24 hr capsule Take 1 capsule (240 mg total) by mouth daily. 04/29/17   Enedina Finner, MD  ELIQUIS 5 MG TABS tablet  05/04/17   [provider]  fluticasone (FLONASE) 50 MCG/ACT nasal spray SHAKE LQ AND U 1 SPR IEN Q 12 H 05/04/17   [provider]  furosemide (LASIX) 20 MG tablet Take 1 tablet (20 mg total) by mouth 2 (two) times daily. 04/29/17 04/29/18  Enedina Finner, MD  ipratropium (ATROVENT) 0.06 % nasal spray Place into the nose.  08/30/17 08/30/18  [provider]  isosorbide mononitrate (IMDUR) 30 MG 24 hr tablet  05/04/17   [provider]  losartan (COZAAR) 25 MG tablet Take 25 mg by mouth daily. 05/04/17   [provider]  magnesium oxide (MAG-OX) 400 MG tablet Take by mouth.    [provider]  metoprolol succinate (TOPROL-XL) 50 MG 24 hr tablet Take 1 tablet (50 mg total) by mouth daily. Take with or immediately following a meal. 04/29/17   Enedina FinnerPatel, Sona, MD  metoprolol succinate  (TOPROL-XL) 50 MG 24 hr tablet Take by mouth. 05/04/17   [provider]  Multiple Vitamin (MULTI-VITAMINS) TABS Take by mouth.    [provider]  niacin (NIASPAN) 1000 MG CR tablet Take 1,000 mg by mouth daily.    [provider]  nitroGLYCERIN (NITROSTAT) 0.4 MG SL tablet DIS 1 T UNT FOR CHEST PAIN UTD. MAY TAKE UP TO 3 DOSES 05/04/17   [provider]  pantoprazole (PROTONIX) 40 MG tablet Take 40 mg by mouth daily. 05/04/17   [provider]  potassium chloride SA (K-DUR,KLOR-CON) 20 MEQ tablet Take 2 tablets (40 mEq total) by mouth daily. 04/29/17   Enedina FinnerPatel, Sona, MD  sertraline (ZOLOFT) 25 MG tablet  05/04/17   [provider]  spironolactone (ALDACTONE) 25 MG tablet  05/04/17   [provider]  traMADol (ULTRAM) 50 MG tablet Take by mouth every 8 (eight) hours as needed.    [provider]    Allergies Percocet [oxycodone-acetaminophen] and Iodinated diagnostic agents  Family History  Problem Relation Age of Onset  . Lung cancer Father     Social History Social History   Tobacco Use  . Smoking status: Former Smoker    Types: Cigarettes    Last attempt to quit: 08/31/1984    Years since quitting: 33.3  . Smokeless tobacco: Never Used  Substance Use Topics  . Alcohol use: No  . Drug use: No    Review of Systems  Constitutional: No fever/chills Eyes: No visual changes. ENT: No sore throat. Cardiovascular: Denies chest pain. Respiratory: As above Gastrointestinal: No abdominal pain.  No nausea, no vomiting.  No diarrhea.  No constipation. Genitourinary: Negative for dysuria. Musculoskeletal: Negative for back pain. Skin: Negative for rash. Neurological: Negative for headaches, focal weakness or numbness.   ____________________________________________   PHYSICAL EXAM:  VITAL SIGNS: ED Triage Vitals  Enc Vitals Group     BP 12/30/17 1404 (!) 137/45     Pulse Rate 12/30/17 1404 (!) 32      Resp 12/30/17 1404 20     Temp 12/30/17 1404 97.8 F (36.6 C)     Temp Source 12/30/17 1404 Oral     SpO2 12/30/17 1404 95 %     Weight 12/30/17 1407 193 lb (87.5 kg)     Height 12/30/17 1407 6' (1.829 m)     Head Circumference --      Peak Flow --      Pain Score 12/30/17 1407 0     Pain Loc --      Pain Edu? --      Excl. in GC? --     Constitutional: Alert and oriented. Well appearing and in no acute distress. Eyes: Conjunctivae are normal.  Head: Atraumatic. Nose: No congestion/rhinnorhea. Mouth/Throat: Mucous membranes are moist.  Neck: No stridor.   Cardiovascular: Normal rate, regular rhythm. Grossly normal heart sounds.   Respiratory: Normal respiratory effort.  No retractions. Lungs CTAB. Gastrointestinal: Soft and  nontender. No distention.  Musculoskeletal: No lower extremity tenderness nor edema.  No joint effusions. Neurologic:  Normal speech and language. No gross focal neurologic deficits are appreciated. Skin:  Skin is warm, dry and intact. No rash noted. Psychiatric: Mood and affect are normal. Speech and behavior are normal.  ____________________________________________   LABS (all labs ordered are listed, but only abnormal results are displayed)  Labs Reviewed  BASIC METABOLIC PANEL  CBC  TROPONIN I   ____________________________________________  EKG  ED ECG REPORT I, Arelia Longest, the attending physician, personally viewed and interpreted this ECG.   Date: 12/30/2017  EKG Time: 1405  Rate: 32  Rhythm: Appears to be in second-degree heart block with 2 1 conduction  Axis: Normal  Intervals:right bundle branch block  ST&T Change: No ST segment elevation or depression.  No abnormal T wave inversion.  ____________________________________________  RADIOLOGY  Chest x-ray without acute abnormality ____________________________________________   PROCEDURES  Procedure(s) performed:   Procedures  Critical Care performed:    ____________________________________________   INITIAL IMPRESSION / ASSESSMENT AND PLAN / ED COURSE  Pertinent labs & imaging results that were available during my care of the patient were reviewed by me and considered in my medical decision making (see chart for details).  Differential includes, but is not limited to, viral syndrome, bronchitis including COPD exacerbation, pneumonia, reactive airway disease including asthma, CHF including exacerbation with or without pulmonary/interstitial edema, pneumothorax, ACS, thoracic trauma, and pulmonary embolism. As part of my medical decision making, I reviewed the following data within the electronic MEDICAL RECORD NUMBER Old chart reviewed  ----------------------------------------- 2:35 PM on 12/30/2017 -----------------------------------------  Discussed case with Dr. Gwen Pounds of cardiology who recommends holding the patient's nodal blockers and observing.  Patient is mentating well.  No complaint of chest pain.  Planes of shortness of breath when at rest here in the emergency department.  ----------------------------------------- 3:32 PM on 12/30/2017 -----------------------------------------  Patient remained stable with pacer pads in place.  Mentating well.  Spoke with family patient no longer on diltiazem.  Discussed this with Dr. Gwen Pounds.  Patient be admitted to the hospitalist service.  Understanding of the treatment plan willing to comply. ____________________________________________   FINAL CLINICAL IMPRESSION(S) / ED DIAGNOSES  Heart block.    NEW MEDICATIONS STARTED DURING THIS VISIT:  New Prescriptions   No medications on file     Note:  This document was prepared using Dragon voice recognition software and may include unintentional dictation errors.     Myrna Blazer, MD 12/30/17 [phone]

## 2017-12-30 NOTE — Consult Note (Signed)
[facility] Cardiology Consultation Note  Patient ID: Justin Mendez, MRN: [id number], DOB/AGE: 09-17-1932 84 y.o. Admit date: 12/30/2017   Date of Consult: 12/30/2017 Primary Physician: Danella Penton, MD Primary Cardiologist: Higinio Roger  Chief Complaint:  Chief Complaint  Patient presents with  . Shortness of Breath   Reason for Consult: Heart block  HPI: 84 y.o. male with known coronary artery disease with severe three-vessel coronary atherosclerosis status post dense anterior myocardial infarction with non-viability of the anterior wall for which medical management has been ensued due to poor candidate of being bypass surgery.  He has had cardiomyopathy for quite some time with LV systolic dysfunction with ejection fraction of 25 to 35% on appropriate medication management including losartan beta-blocker and spironolactone.  He has peripheral vascular disease with bilateral carotid atherosclerosis and endarterectomies with peripheral vascular disease on aspirin and Plavix.  He also has high intensity cholesterol therapy.  The patient has had a recent episode of atrial fibrillation with rapid ventricular rate for which she spontaneously converted back to normal sinus rhythm with his beta-blocker and diltiazem and now has only been using his beta-blocker.  He has been feeling weak and fatigued and some dizziness recently for which the patient was seen in the emergency room.  At that time the patient had an EKG suggesting normal sinus rhythm with 2-1 block consistent with the possibility of advanced heart block.  At 30 bpm the patient has had no significant symptoms other than above and has had no evidence of chest pain or syncope.  Hemodynamically he is stable at this time.  He also has moderate aortic valve stenosis which has been relatively stable in recent weeks and may be contributing to above.  He has been on anticoagulation as well making any further significant procedures concerning due  to the possibility of bleeding complications.  We have discussed at length temporary pacemaker with all of its risks and benefits and bleeding complications if necessity for symptomatic's second-degree heart block.  Currently there is no symptoms no evidence of further advancement of heart block  Past Medical History:  Diagnosis Date  . H/O carotid endarterectomy   . Hearing loss   . High cholesterol   . Hypertension   . Neuropathy   . PAD (peripheral artery disease) [facility])       Surgical History:  Past Surgical History:  Procedure Laterality Date  . carotid endarderectomy     Bilateral  . KNEE SURGERY     Left  . NECK SURGERY    . RIGHT/LEFT HEART CATH AND CORONARY ANGIOGRAPHY N/A 04/29/2017   Procedure: RIGHT/LEFT HEART CATH AND CORONARY ANGIOGRAPHY;  Surgeon: Marcina Millard, MD;  Location: [facility] INVASIVE CV LAB;  Service: Cardiovascular;  Laterality: N/A;     Home Meds: Prior to Admission medications   Medication Sig Start Date End Date Taking? Authorizing Provider  aspirin EC 81 MG tablet Take 81 mg by mouth daily.    [provider]  atorvastatin (LIPITOR) 80 MG tablet  05/04/17   [provider]  cilostazol (PLETAL) 100 MG tablet Take 100 mg by mouth 2 (two) times daily.    [provider]  Cinnamon 500 MG capsule Take 500 mg by mouth daily.     [provider]  clopidogrel (PLAVIX) 75 MG tablet  05/04/17   [provider]  Co-Enzyme Q-10 30 MG CAPS Take by mouth daily.     [provider]  diltiazem (CARDIZEM CD) 240 MG 24 hr capsule  Take 1 capsule (240 mg total) by mouth daily. 04/29/17   Enedina Finner, MD  ELIQUIS 5 MG TABS tablet  05/04/17   [provider]  fluticasone (FLONASE) 50 MCG/ACT nasal spray SHAKE LQ AND U 1 SPR IEN Q 12 H 05/04/17   [provider]  furosemide (LASIX) 20 MG tablet Take 1 tablet (20 mg total) by mouth 2 (two) times daily. 04/29/17 04/29/18  Enedina Finner, MD  ipratropium  (ATROVENT) 0.06 % nasal spray Place into the nose. 08/30/17 08/30/18  [provider]  isosorbide mononitrate (IMDUR) 30 MG 24 hr tablet  05/04/17   [provider]  losartan (COZAAR) 25 MG tablet Take 25 mg by mouth daily. 05/04/17   [provider]  magnesium oxide (MAG-OX) 400 MG tablet Take by mouth.    [provider]  metoprolol succinate (TOPROL-XL) 50 MG 24 hr tablet Take 1 tablet (50 mg total) by mouth daily. Take with or immediately following a meal. 04/29/17   Enedina Finner, MD  metoprolol succinate (TOPROL-XL) 50 MG 24 hr tablet Take by mouth. 05/04/17   [provider]  Multiple Vitamin (MULTI-VITAMINS) TABS Take by mouth.    [provider]  niacin (NIASPAN) 1000 MG CR tablet Take 1,000 mg by mouth daily.    [provider]  nitroGLYCERIN (NITROSTAT) 0.4 MG SL tablet DIS 1 T UNT FOR CHEST PAIN UTD. MAY TAKE UP TO 3 DOSES 05/04/17   [provider]  pantoprazole (PROTONIX) 40 MG tablet Take 40 mg by mouth daily. 05/04/17   [provider]  potassium chloride SA (K-DUR,KLOR-CON) 20 MEQ tablet Take 2 tablets (40 mEq total) by mouth daily. 04/29/17   Enedina Finner, MD  sertraline (ZOLOFT) 25 MG tablet  05/04/17   [provider]  spironolactone (ALDACTONE) 25 MG tablet  05/04/17   [provider]  traMADol (ULTRAM) 50 MG tablet Take by mouth every 8 (eight) hours as needed.    [provider]    Inpatient Medications:  . aspirin EC  81 mg Oral Daily  . atorvastatin  80 mg Oral q1800  . cilostazol  100 mg Oral BID  . clopidogrel  75 mg Oral Daily  . fluticasone  2 spray Each Nare Daily  . furosemide  20 mg Oral BID  . ipratropium  2 spray Nasal TID  . isosorbide mononitrate  30 mg Oral Daily  . losartan  25 mg Oral Daily  . magnesium oxide  400 mg Oral Daily  . niacin  1,000 mg Oral Daily  . pantoprazole  40 mg Oral Daily  . potassium chloride SA  40 mEq Oral Daily  .  sertraline  25 mg Oral Daily  . spironolactone  25 mg Oral Daily     Allergies:  Allergies  Allergen Reactions  . Percocet [Oxycodone-Acetaminophen] Other (See Comments)    Confusion/hallucinations  . Iodinated Diagnostic Agents Other (See Comments)     Was very "flushed", denies itching, swelling or hives. He states this was over 30 years ago with Ionic contrast and not the current non-ionic contrast. SPM    Social History   Socioeconomic History  . Marital status: Married    Spouse name: Not on file  . Number of children: Not on file  . Years of education: Not on file  . Highest education level: Not on file  Occupational History  . Not on file  Social Needs  . Financial resource strain: Not on file  . Food insecurity:  Worry: Not on file    Inability: Not on file  . Transportation needs:    Medical: Not on file    Non-medical: Not on file  Tobacco Use  . Smoking status: Former Smoker    Types: Cigarettes    Last attempt to quit: 08/31/1984    Years since quitting: 33.3  . Smokeless tobacco: Never Used  Substance and Sexual Activity  . Alcohol use: No  . Drug use: No  . Sexual activity: Not on file  Lifestyle  . Physical activity:    Days per week: Not on file    Minutes per session: Not on file  . Stress: Not on file  Relationships  . Social connections:    Talks on phone: Not on file    Gets together: Not on file    Attends religious service: Not on file    Active member of club or organization: Not on file    Attends meetings of clubs or organizations: Not on file    Relationship status: Not on file  . Intimate partner violence:    Fear of current or ex partner: Not on file    Emotionally abused: Not on file    Physically abused: Not on file    Forced sexual activity: Not on file  Other Topics Concern  . Not on file  Social History Narrative  . Not on file     Family History  Problem Relation Age of Onset  . Lung cancer Father      Review of  Systems Positive for dizziness Negative for: General:  chills, fever, night sweats or weight changes.  Cardiovascular: PND orthopnea syncope dizziness  Dermatological skin lesions rashes Respiratory: Cough congestion Urologic: Frequent urination urination at night and hematuria Abdominal: negative for nausea, vomiting, diarrhea, bright red blood per rectum, melena, or hematemesis Neurologic: negative for visual changes, and/or hearing changes  All other systems reviewed and are otherwise negative except as noted above.  Labs: Recent Labs    12/30/17 1428  TROPONINI 0.03*   Lab Results  Component Value Date   WBC 9.8 12/30/2017   HGB 14.7 12/30/2017   HCT 42.1 12/30/2017   MCV 92.1 12/30/2017   PLT 219 12/30/2017    Recent Labs  Lab 12/30/17 1428  NA 137  K 4.9  CL 106  CO2 22  BUN 35*  CREATININE 1.58*  CALCIUM 9.7  GLUCOSE 131*   No results found for: CHOL, HDL, LDLCALC, TRIG No results found for: DDIMER  Radiology/Studies:  Dg Chest Portable 1 View  Result Date: 12/30/2017 CLINICAL DATA:  Shortness of breath, bradycardia, history hypertension EXAM: PORTABLE CHEST 1 VIEW COMPARISON:  Portable exam 1406 hours compared to 04/27/2015 FINDINGS: Upper normal size of cardiac silhouette. Mediastinal contours and pulmonary vascularity normal. Atherosclerotic calcification aorta. Lungs clear. No pleural effusion or pneumothorax. Bones demineralized with BILATERAL AC joint and glenohumeral degenerative changes and evidence of prior cervical spine fusion. IMPRESSION: No acute abnormalities. Electronically Signed   By: Ulyses SouthwardMark  Boles M.D.   On: 12/30/2017 14:26    EKG: Normal sinus rhythm with 2-1 block and interventricular conduction defer  Weights: Filed Weights   12/30/17 1407 12/30/17 1610  Weight: 193 lb (87.5 kg) 196 lb 8 oz (89.1 kg)     Physical Exam: Blood pressure (!) 155/56, pulse (!) 34, temperature (!) 96.8 F (36 C), temperature source Axillary, resp. rate  15, height 6' (1.829 m), weight 196 lb 8 oz (89.1 kg), SpO2 98 %. Body  mass index is 26.65 kg/m. General: Well developed, well nourished, in no acute distress. Head eyes ears nose throat: Normocephalic, atraumatic, sclera non-icteric, no xanthomas, nares are without discharge. No apparent thyromegaly and/or mass  Lungs: Normal respiratory effort.  no wheezes, no rales, no rhonchi.  Heart: Slow but regular heart rate with normal S1 soft S2. no murmur gallop, no rub, PMI is normal size and placement, carotid upstroke normal with bruit, jugular venous pressure is normal Abdomen: Soft, non-tender, non-distended with normoactive bowel sounds. No hepatomegaly. No rebound/guarding. No obvious abdominal masses. Abdominal aorta is normal size without bruit Extremities: Trace edema. no cyanosis, no clubbing, no ulcers  Peripheral : 2+ bilateral upper extremity pulses, 2+ bilateral femoral pulses, 2+ bilateral dorsal pedal pulse Neuro: Alert and oriented. No facial asymmetry. No focal deficit. Moves all extremities spontaneously. Musculoskeletal: Normal muscle tone without kyphosis Psych:  Responds to questions appropriately with a normal affect.    Assessment: 84 year old male with moderate aortic valve stenosis essential hypertension mixed hyperlipidemia three-vessel coronary artery disease with dense anterior myocardial infarction paroxysmal nonvalvular atrial fibrillation with 2-1 heart block and low heart rate but no evidence of significant symptoms requiring further intervention at this time no current evidence of congestive heart failure exacerbation or myocardial infarction  Plan: 1.  Continue ICU monitoring for further advanced heart block and need for temporary pacemaker wire which has been further discussed at this time of its all of its risks and benefits 2.  Suspend Eliquis and Plavix for now in case pacemaker or permanent defibrillator pacemaker needed to be placed in the next several days 3.   Continue losartan and spironolactone but suspend beta-blocker due to heart block 4.  Further treatment options after above  Signed, Lamar Blinks M.D. [facility] 12/30/2017, 5:30 PM

## 2017-12-30 NOTE — H&P (Addendum)
[facility] [place] [facility]   PATIENT NAME: Justin Mendez    MR#:  [id number]  DATE OF BIRTH:  06/30/1933  DATE OF ADMISSION:  12/30/2017  PRIMARY CARE PHYSICIAN: Danella PentonMiller, Mark F, MD   REQUESTING/REFERRING PHYSICIAN: Myrna BlazerSchaevitz, David Matthew, MD  CHIEF COMPLAINT:   DIZZY HISTORY OF PRESENT ILLNESS:  Justin Mendez  is a 84 y.o. male with a known history of hypertension, hyperlipidemia, chronic atrial fibrillation on metoprolol and Cardizem, peripheral arterial disease and other medical problems presenting to the ED with a chief complaint of dizzy spells.  Patient denies any complaints while resting but whenever he is ambulating he is feeling dizzy and short of breath.  In the ED heart rate was at around 28-32.  As per my discussion with the cardiologist we are going to hold patient's rate limiting drugs and EKG has revealed second- degree heart block  PAST MEDICAL HISTORY:   Past Medical History:  Diagnosis Date  . H/O carotid endarterectomy   . Hearing loss   . High cholesterol   . Hypertension   . Neuropathy   . PAD (peripheral artery disease) (HCC)     PAST SURGICAL HISTOIRY:   Past Surgical History:  Procedure Laterality Date  . carotid endarderectomy     Bilateral  . KNEE SURGERY     Left  . NECK SURGERY    . RIGHT/LEFT HEART CATH AND CORONARY ANGIOGRAPHY N/A 04/29/2017   Procedure: RIGHT/LEFT HEART CATH AND CORONARY ANGIOGRAPHY;  Surgeon: Marcina MillardParaschos, Alexander, MD;  Location: [facility] INVASIVE CV LAB;  Service: Cardiovascular;  Laterality: N/A;    SOCIAL HISTORY:   Social History   Tobacco Use  . Smoking status: Former Smoker    Types: Cigarettes    Last attempt to quit: 08/31/1984    Years since quitting: 33.3  . Smokeless tobacco: Never Used  Substance Use Topics  . Alcohol use: No    FAMILY HISTORY:   Family History  Problem Relation Age of Onset  . Lung cancer Father     DRUG ALLERGIES:   Allergies  Allergen  Reactions  . Percocet [Oxycodone-Acetaminophen] Other (See Comments)    Confusion/hallucinations  . Iodinated Diagnostic Agents Other (See Comments)     Was very "flushed", denies itching, swelling or hives. He states this was over 30 years ago with Ionic contrast and not the current non-ionic contrast. SPM    REVIEW OF SYSTEMS:  CONSTITUTIONAL: No fever, fatigue or weakness.  EYES: No blurred or double vision.  EARS, NOSE, AND THROAT: No tinnitus or ear pain.  RESPIRATORY: No cough, reports exertional shortness of breath, denies wheezing or hemoptysis.  CARDIOVASCULAR: No chest pain, orthopnea, edema.  GASTROINTESTINAL: No nausea, vomiting, diarrhea or abdominal pain.  GENITOURINARY: No dysuria, hematuria.  ENDOCRINE: No polyuria, nocturia,  HEMATOLOGY: No anemia, easy bruising or bleeding SKIN: No rash or lesion. MUSCULOSKELETAL: No joint pain or arthritis.   NEUROLOGIC: No tingling, numbness, weakness.  Reporting dizziness with ambulation PSYCHIATRY: No anxiety or depression.   MEDICATIONS AT HOME:   Prior to Admission medications   Medication Sig Start Date End Date Taking? Authorizing Provider  aspirin EC 81 MG tablet Take 81 mg by mouth daily.    [provider]  atorvastatin (LIPITOR) 80 MG tablet  05/04/17   [provider]  cilostazol (PLETAL) 100 MG tablet Take 100 mg by mouth 2 (two) times daily.    [provider]  Cinnamon 500 MG capsule Take 500 mg by mouth daily.  [provider]  clopidogrel (PLAVIX) 75 MG tablet  05/04/17   [provider]  Co-Enzyme Q-10 30 MG CAPS Take by mouth daily.     [provider]  diltiazem (CARDIZEM CD) 240 MG 24 hr capsule Take 1 capsule (240 mg total) by mouth daily. 04/29/17   Enedina Finner, MD  ELIQUIS 5 MG TABS tablet  05/04/17   [provider]  fluticasone (FLONASE) 50 MCG/ACT nasal spray SHAKE LQ AND U 1 SPR IEN Q 12 H 05/04/17   [provider]  furosemide  (LASIX) 20 MG tablet Take 1 tablet (20 mg total) by mouth 2 (two) times daily. 04/29/17 04/29/18  Enedina Finner, MD  ipratropium (ATROVENT) 0.06 % nasal spray Place into the nose. 08/30/17 08/30/18  [provider]  isosorbide mononitrate (IMDUR) 30 MG 24 hr tablet  05/04/17   [provider]  losartan (COZAAR) 25 MG tablet Take 25 mg by mouth daily. 05/04/17   [provider]  magnesium oxide (MAG-OX) 400 MG tablet Take by mouth.    [provider]  metoprolol succinate (TOPROL-XL) 50 MG 24 hr tablet Take 1 tablet (50 mg total) by mouth daily. Take with or immediately following a meal. 04/29/17   Enedina Finner, MD  metoprolol succinate (TOPROL-XL) 50 MG 24 hr tablet Take by mouth. 05/04/17   [provider]  Multiple Vitamin (MULTI-VITAMINS) TABS Take by mouth.    [provider]  niacin (NIASPAN) 1000 MG CR tablet Take 1,000 mg by mouth daily.    [provider]  nitroGLYCERIN (NITROSTAT) 0.4 MG SL tablet DIS 1 T UNT FOR CHEST PAIN UTD. MAY TAKE UP TO 3 DOSES 05/04/17   [provider]  pantoprazole (PROTONIX) 40 MG tablet Take 40 mg by mouth daily. 05/04/17   [provider]  potassium chloride SA (K-DUR,KLOR-CON) 20 MEQ tablet Take 2 tablets (40 mEq total) by mouth daily. 04/29/17   Enedina Finner, MD  sertraline (ZOLOFT) 25 MG tablet  05/04/17   [provider]  spironolactone (ALDACTONE) 25 MG tablet  05/04/17   [provider]  traMADol (ULTRAM) 50 MG tablet Take by mouth every 8 (eight) hours as needed.    [provider]      VITAL SIGNS:  Blood pressure (!) 130/94, pulse (!) 32, temperature 97.9 F (36.6 C), temperature source Oral, resp. rate 18, height 6' (1.829 m), weight 89.1 kg (196 lb 8 oz), SpO2 96 %.  PHYSICAL EXAMINATION:  GENERAL:  84 y.o.-year-old patient lying in the bed with no acute distress.  EYES: Pupils equal, round, reactive to light and accommodation. No scleral  icterus. Extraocular muscles intact.  HEENT: Head atraumatic, normocephalic. Oropharynx and nasopharynx clear.  NECK:  Supple, no jugular venous distention. No thyroid enlargement, no tenderness.  LUNGS: Normal breath sounds bilaterally, no wheezing, rales,rhonchi or crepitation. No use of accessory muscles of respiration.  CARDIOVASCULAR:  bradycardic no murmurs, rubs, or gallops.  ABDOMEN: Soft, nontender, nondistended. Bowel sounds present.  EXTREMITIES: No pedal edema, cyanosis, or clubbing.  NEUROLOGIC: Cranial nerves II through XII are intact. Muscle strength 5/5 in all extremities. Sensation intact. Gait not checked.  PSYCHIATRIC: The patient is alert and oriented x 3.  SKIN: No obvious rash, lesion, or ulcer.   LABORATORY PANEL:   CBC Recent Labs  Lab 12/30/17 1428  WBC 9.8  HGB 14.7  HCT 42.1  PLT 219   ------------------------------------------------------------------------------------------------------------------  Chemistries  Recent Labs  Lab 12/30/17 1428  NA 137  K 4.9  CL 106  CO2 22  GLUCOSE 131*  BUN 35*  CREATININE 1.58*  CALCIUM 9.7   ------------------------------------------------------------------------------------------------------------------  Cardiac Enzymes Recent Labs  Lab 12/30/17 1428  TROPONINI 0.03*   ------------------------------------------------------------------------------------------------------------------  RADIOLOGY:  Dg Chest Portable 1 View  Result Date: 12/30/2017 CLINICAL DATA:  Shortness of breath, bradycardia, history hypertension EXAM: PORTABLE CHEST 1 VIEW COMPARISON:  Portable exam 1406 hours compared to 04/27/2015 FINDINGS: Upper normal size of cardiac silhouette. Mediastinal contours and pulmonary vascularity normal. Atherosclerotic calcification aorta. Lungs clear. No pleural effusion or pneumothorax. Bones demineralized with BILATERAL AC joint and glenohumeral degenerative changes and evidence of prior cervical  spine fusion. IMPRESSION: No acute abnormalities. Electronically Signed   By: Ulyses Southward M.D.   On: 12/30/2017 14:26    EKG:   Orders placed or performed during the hospital encounter of 12/30/17  . ED EKG within 10 minutes  . ED EKG within 10 minutes   EKG revealed second-degree type II block IMPRESSION AND PLAN:    #Dizziness secondary to bradycardia/second-degree heart block Monitor patient on telemetry in  stepdown unit Gentle hydration with IV fluids  #Sinus bradycardia secondary to second-degree heart block Discontinued metoprolol and Cardizem Bedside pacer pads Atropine as needed for symptomatic bradycardia  consult placed and discussed with Dr. Gwen Pounds  #Chronic atrial fibrillation currently rate controlled Discontinue metoprolol and Cardizem and continue Eliquis  #History of peripheral arterial disease continue Pletal  #Hyperlipidemia continue statin  #Essential hypertension Currently holding rate limiting drugs including metoprolol and Cardizem.  Continue Cozaar  DVT prophylaxis patient is on Eliquis  All the records are reviewed and case discussed with ED provider. Management plans discussed with the patient, family and they are in agreement.  CODE STATUS: DNR  TOTAL CRITICAL CARETIME TAKING CARE OF THIS PATIENT: 43  minutes.   Note: This dictation was prepared with Dragon dictation along with smaller phrase technology. Any transcriptional errors that result from this process are unintentional.  Ramonita Lab M.D on 12/30/2017 at 4:29 PM  Between 7am to 6pm - Pager - [phone]  After 6pm go to [email or web address] - password [email or web address] [facility]  [place] [facility]  Office  [phone]  CC: Primary care physician; Danella Penton, MD

## 2017-12-30 NOTE — Progress Notes (Signed)
Atropine 0.5 mg given slow IVP.  Heart rate went from 27 to 35 and immediately dropped back to 31.

## 2017-12-30 NOTE — Progress Notes (Signed)
Name: Justin Mendez MRN: [id number] DOB: 02/26/1933    ADMISSION DATE:  12/30/2017 CONSULTATION DATE: 12/30/2017  REFERRING MD : Dr. Amado Coe   CHIEF COMPLAINT: Weakness   BRIEF PATIENT DESCRIPTION:  84 yo male admitted with 2:1 heart block likely secondary to antiarrhythmic medications  SIGNIFICANT EVENTS/STUDIES:  06/17 Pt admitted to medsurg unit  06/17 Pt transferred to stepdown unit for closer monitoring   HISTORY OF PRESENT ILLNESS:   This is an 84 yo male with a PMH of PAD, Neuropathy, HTN, Atrial Fibrillation, CHF, Hyperlipidemia, and Carotid Endarterectomy.  He presented to [facility] ER on 06/17 with weakness.  Per ER notes the pt stated the morning of 06/17 his heart rate was 28-32 bpm, however he still took his metoprolol and diltiazem. While at work he had difficulty standing and shortness of breath with exertion prompting ER visit.  In the ER EKG revealed a 2:1 heart block hr 30's.  He was subsequently admitted to the telemetry unit by hospitalist team for further workup and treatment.  On 06/17 he required transfer to the stepdown unit for closer monitoring.    PAST MEDICAL HISTORY :   has a past medical history of H/O carotid endarterectomy, Hearing loss, High cholesterol, Hypertension, Neuropathy, and PAD (peripheral artery disease) (HCC).  has a past surgical history that includes Knee surgery; Neck surgery; carotid endarderectomy; and RIGHT/LEFT HEART CATH AND CORONARY ANGIOGRAPHY (N/A, 04/29/2017). Prior to Admission medications   Medication Sig Start Date End Date Taking? Authorizing Provider  aspirin EC 81 MG tablet Take 81 mg by mouth daily.    [provider]  atorvastatin (LIPITOR) 80 MG tablet  05/04/17   [provider]  cilostazol (PLETAL) 100 MG tablet Take 100 mg by mouth 2 (two) times daily.    [provider]  Cinnamon 500 MG capsule Take 500 mg by mouth daily.     [provider]  clopidogrel (PLAVIX) 75 MG tablet  05/04/17    [provider]  Co-Enzyme Q-10 30 MG CAPS Take by mouth daily.     [provider]  diltiazem (CARDIZEM CD) 240 MG 24 hr capsule Take 1 capsule (240 mg total) by mouth daily. 04/29/17   Enedina Finner, MD  ELIQUIS 5 MG TABS tablet  05/04/17   [provider]  fluticasone (FLONASE) 50 MCG/ACT nasal spray SHAKE LQ AND U 1 SPR IEN Q 12 H 05/04/17   [provider]  furosemide (LASIX) 20 MG tablet Take 1 tablet (20 mg total) by mouth 2 (two) times daily. 04/29/17 04/29/18  Enedina Finner, MD  ipratropium (ATROVENT) 0.06 % nasal spray Place into the nose. 08/30/17 08/30/18  [provider]  isosorbide mononitrate (IMDUR) 30 MG 24 hr tablet  05/04/17   [provider]  losartan (COZAAR) 25 MG tablet Take 25 mg by mouth daily. 05/04/17   [provider]  magnesium oxide (MAG-OX) 400 MG tablet Take by mouth.    [provider]  metoprolol succinate (TOPROL-XL) 50 MG 24 hr tablet Take 1 tablet (50 mg total) by mouth daily. Take with or immediately following a meal. 04/29/17   Enedina Finner, MD  metoprolol succinate (TOPROL-XL) 50 MG 24 hr tablet Take by mouth. 05/04/17   [provider]  Multiple Vitamin (MULTI-VITAMINS) TABS Take by mouth.    [provider]  niacin (NIASPAN) 1000 MG CR tablet Take 1,000 mg by mouth daily.    [provider]  nitroGLYCERIN (NITROSTAT) 0.4 MG SL tablet DIS 1  T UNT FOR CHEST PAIN UTD. MAY TAKE UP TO 3 DOSES 05/04/17   [provider]  pantoprazole (PROTONIX) 40 MG tablet Take 40 mg by mouth daily. 05/04/17   [provider]  potassium chloride SA (K-DUR,KLOR-CON) 20 MEQ tablet Take 2 tablets (40 mEq total) by mouth daily. 04/29/17   Enedina FinnerPatel, Sona, MD  sertraline (ZOLOFT) 25 MG tablet  05/04/17   [provider]  spironolactone (ALDACTONE) 25 MG tablet  05/04/17   [provider]  traMADol (ULTRAM) 50 MG tablet Take by mouth every 8 (eight) hours as  needed.    [provider]   Allergies  Allergen Reactions  . Percocet [Oxycodone-Acetaminophen] Other (See Comments)    Confusion/hallucinations  . Iodinated Diagnostic Agents Other (See Comments)     Was very "flushed", denies itching, swelling or hives. He states this was over 30 years ago with Ionic contrast and not the current non-ionic contrast. SPM    FAMILY HISTORY:  family history includes Lung cancer in his father. SOCIAL HISTORY:  reports that he quit smoking about 33 years ago. His smoking use included cigarettes. He has never used smokeless tobacco. He reports that he does not drink alcohol or use drugs.  REVIEW OF SYSTEMS: Positives in BOLD  Constitutional: Negative for fever, chills, weight loss, malaise/fatigue and diaphoresis.  HENT: Negative for hearing loss, ear pain, nosebleeds, congestion, sore throat, neck pain, tinnitus and ear discharge.   Eyes: Negative for blurred vision, double vision, photophobia, pain, discharge and redness.  Respiratory: cough, hemoptysis, sputum production, shortness of breath with exertion, wheezing and stridor.   Cardiovascular: Negative for chest pain, palpitations, orthopnea, claudication, leg swelling and PND.  Gastrointestinal: Negative for heartburn, nausea, vomiting, abdominal pain, diarrhea, constipation, blood in stool and melena.  Genitourinary: Negative for dysuria, urgency, frequency, hematuria and flank pain.  Musculoskeletal: Negative for myalgias, back pain, joint pain and falls.  Skin: Negative for itching and rash.  Neurological: dizziness, tingling, tremors, sensory change, speech change, focal weakness, seizures, loss of consciousness, weakness and headaches.  Endo/Heme/Allergies: Negative for environmental allergies and polydipsia. Does not bruise/bleed easily.  SUBJECTIVE:  No complaints at this time   VITAL SIGNS: Temp:  [96.6 F (35.9 C)-97.9 F (36.6 C)] 96.6 F (35.9 C) (06/17 1927) Pulse Rate:   [29-53] 44 (06/17 2000) Resp:  [10-20] 19 (06/17 2000) BP: (113-176)/(41-158) 133/44 (06/17 2000) SpO2:  [94 %-98 %] 96 % (06/17 2000) Weight:  [87.5 kg (193 lb)-89.1 kg (196 lb 8 oz)] 89.1 kg (196 lb 8 oz) (06/17 1610)  PHYSICAL EXAMINATION: General: well developed, well nourished male, NAD  Neuro: alert and oriented, follows commands  HEENT: supple, no JVD  Cardiovascular: 2nd degree heart block, no R/ G Lungs: clear throughout, even, non labored  Abdomen: +BS x4, soft, non tender, non distended  Musculoskeletal: normal bulk and tone, no edema Skin: intact no rashes or lesions   Recent Labs  Lab 12/30/17 1428  NA 137  K 4.9  CL 106  CO2 22  BUN 35*  CREATININE 1.58*  GLUCOSE 131*   Recent Labs  Lab 12/30/17 1428  HGB 14.7  HCT 42.1  WBC 9.8  PLT 219   Dg Chest Portable 1 View  Result Date: 12/30/2017 CLINICAL DATA:  Shortness of breath, bradycardia, history hypertension EXAM: PORTABLE CHEST 1 VIEW COMPARISON:  Portable exam 1406 hours compared to 04/27/2015 FINDINGS: Upper normal size of cardiac silhouette. Mediastinal contours and pulmonary vascularity normal. Atherosclerotic calcification aorta. Lungs clear. No pleural  effusion or pneumothorax. Bones demineralized with BILATERAL AC joint and glenohumeral degenerative changes and evidence of prior cervical spine fusion. IMPRESSION: No acute abnormalities. Electronically Signed   By: Ulyses Southward M.D.   On: 12/30/2017 14:26    ASSESSMENT / PLAN: 2:1 Heart Block secondary to antiarrhythmic medications  Acute renal failure  Elevated troponin's likely secondary to demand ischemia in setting of heart block  Hx: Atrial Fibrillation, CHF, HTN, and Hyperlipidemia  P: Supplemental O2 for dyspnea and/or hypoxia  Continuous telemetry monitoring  Trend troponin's  Will give 5 mg iv glucagon x1 dose if hr remains in the 20-30's Will hold outpatient cardizem and metoprolol, continue other outpatient cardiac medications  Prn  atropine for hr <35  Cardiology consulted appreciate input Trend BMP  Replace electrolytes as indicated  Monitor UOP  VTE px: SCD's, no chemical prophylaxis for possible pacemaker placement if needed   Sonda Rumble, AGNP  Pulmonary/Critical Care Pager [phone] (please enter 7 digits) PCCM Consult Pager [phone] (please enter 7 digits)

## 2017-12-30 NOTE — Progress Notes (Signed)
ANTICOAGULATION CONSULT NOTE - Initial Consult  Pharmacy Consult for Eliquis  Indication: Afib   Allergies  Allergen Reactions  . Percocet [Oxycodone-Acetaminophen] Other (See Comments)    Confusion/hallucinations  . Iodinated Diagnostic Agents Other (See Comments)     Was very "flushed", denies itching, swelling or hives. He states this was over 30 years ago with Ionic contrast and not the current non-ionic contrast. SPM    Patient Measurements: Height: 6' (182.9 cm) Weight: 196 lb 8 oz (89.1 kg) IBW/kg (Calculated) : 77.6 Heparin Dosing Weight:   Vital Signs: Temp: 97.9 F (36.6 C) (06/17 1610) Temp Source: Oral (06/17 1610) BP: 130/94 (06/17 1610) Pulse Rate: 32 (06/17 1610)  Labs: Recent Labs    12/30/17 1428  HGB 14.7  HCT 42.1  PLT 219  CREATININE 1.58*  TROPONINI 0.03*    Estimated Creatinine Clearance: 38.9 mL/min (A) (by C-G formula based on SCr of 1.58 mg/dL (H)).   Medical History: Past Medical History:  Diagnosis Date  . H/O carotid endarterectomy   . Hearing loss   . High cholesterol   . Hypertension   . Neuropathy   . PAD (peripheral artery disease) (HCC)     Medications:    Assessment: 84 year old male, SrCr = 1.58,  Wt = 89.1   Goal of Therapy:  Prevention of thromboembolism    Plan:  Will adjust dose from Eliquis 5 mg PO BID to Eliquis 2.5 mg PO BID to start 6/17 @ 22:00.   Jacarri Gesner D 12/30/2017,5:09 PM

## 2017-12-31 LAB — TROPONIN I: Troponin I: <0.03 ng/mL (ref <0.03)

## 2017-12-31 LAB — BASIC METABOLIC PANEL
Anion gap: 9 (ref 5–15)
BUN: 29 mg/dL — ABNORMAL HIGH (ref 6–20)
CALCIUM: 9.5 mg/dL (ref 8.9–10.3)
CO2: 23 mmol/L (ref 22–32)
CREATININE: 1.35 mg/dL — AB (ref 0.61–1.24)
Chloride: 109 mmol/L (ref 101–111)
GFR, EST AFRICAN AMERICAN: 54 mL/min — AB (ref >60)
GFR, EST NON AFRICAN AMERICAN: 47 mL/min — AB (ref >60)
Glucose, Bld: 101 mg/dL — ABNORMAL HIGH (ref 65–99)
Potassium: 4.5 mmol/L (ref 3.5–5.1)
SODIUM: 141 mmol/L (ref 135–145)

## 2017-12-31 LAB — CBC
HEMATOCRIT: 44 % (ref 40.0–52.0)
Hemoglobin: 15 g/dL (ref 13.0–18.0)
MCH: 31.5 pg (ref 26.0–34.0)
MCHC: 34.1 g/dL (ref 32.0–36.0)
MCV: 92.3 fL (ref 80.0–100.0)
PLATELETS: 191 10*3/uL (ref 150–440)
RBC: 4.77 MIL/uL (ref 4.40–5.90)
RDW: 13.4 % (ref 11.5–14.5)
WBC: 8.2 10*3/uL (ref 3.8–10.6)

## 2017-12-31 LAB — TSH: TSH: 2.301 u[IU]/mL (ref 0.350–4.500)

## 2017-12-31 MED ORDER — POTASSIUM CHLORIDE CRYS ER 20 MEQ PO TBCR
20.0000 meq | EXTENDED_RELEASE_TABLET | Freq: Every day | ORAL | Status: DC
  Administered 2018-01-01 – 2018-01-04 (×4): 20 meq via ORAL
  Filled 2017-12-31 (×4): qty 1

## 2017-12-31 MED ORDER — LOSARTAN POTASSIUM 50 MG PO TABS
50.0000 mg | ORAL_TABLET | Freq: Every day | ORAL | Status: DC
  Administered 2018-01-01 – 2018-01-04 (×4): 50 mg via ORAL
  Filled 2017-12-31 (×4): qty 1

## 2017-12-31 MED ORDER — FUROSEMIDE 20 MG PO TABS
20.0000 mg | ORAL_TABLET | Freq: Two times a day (BID) | ORAL | Status: DC
  Administered 2017-12-31 – 2018-01-01 (×2): 20 mg via ORAL
  Filled 2017-12-31 (×2): qty 1

## 2017-12-31 NOTE — Progress Notes (Signed)
[employer] - [place] at [facility]   PATIENT NAME: Justin Mendez    MR#:  [id number]  DATE OF BIRTH:  02/20/1933  SUBJECTIVE:   Patient here due to dizziness and symptomatic bradycardia.  Heart rates have improved since yesterday into the low 50s.  Patient's blood pressure is little bit on the higher side but he is clinically asymptomatic.  Patient's family is at bedside.  REVIEW OF SYSTEMS:    Review of Systems  Constitutional: Negative for chills and fever.  HENT: Negative for congestion and tinnitus.   Eyes: Negative for blurred vision and double vision.  Respiratory: Negative for cough, shortness of breath and wheezing.   Cardiovascular: Negative for chest pain, orthopnea and PND.  Gastrointestinal: Negative for abdominal pain, diarrhea, nausea and vomiting.  Genitourinary: Negative for dysuria and hematuria.  Neurological: Negative for dizziness, sensory change and focal weakness.  All other systems reviewed and are negative.   Nutrition: Heart Healthy Tolerating Diet: Yes Tolerating PT: Ambulatory   DRUG ALLERGIES:   Allergies  Allergen Reactions  . Percocet [Oxycodone-Acetaminophen] Other (See Comments)    Confusion/hallucinations  . Iodinated Diagnostic Agents Other (See Comments)     Was very "flushed", denies itching, swelling or hives. He states this was over 30 years ago with Ionic contrast and not the current non-ionic contrast. SPM    VITALS:  Blood pressure 135/62, pulse (!) 53, temperature (!) 97.5 F (36.4 C), temperature source Oral, resp. rate 14, height 6' (1.829 m), weight 89.1 kg (196 lb 8 oz), SpO2 91 %.  PHYSICAL EXAMINATION:   Physical Exam  GENERAL:  84 y.o.-year-old patient lying in bed in no acute distress.  EYES: Pupils equal, round, reactive to light and accommodation. No scleral icterus. Extraocular muscles intact.  HEENT: Head atraumatic, normocephalic. Oropharynx and nasopharynx clear.  NECK:  Supple, no jugular  venous distention. No thyroid enlargement, no tenderness.  LUNGS: Normal breath sounds bilaterally, no wheezing, rales, rhonchi. No use of accessory muscles of respiration.  CARDIOVASCULAR: S1, S2 RRR, Bradycardic. No murmurs, rubs, or gallops.  ABDOMEN: Soft, nontender, nondistended. Bowel sounds present. No organomegaly or mass.  EXTREMITIES: No cyanosis, clubbing or edema b/l.    NEUROLOGIC: Cranial nerves II through XII are intact. No focal Motor or sensory deficits b/l.   PSYCHIATRIC: The patient is alert and oriented x 3.  SKIN: No obvious rash, lesion, or ulcer.    LABORATORY PANEL:   CBC Recent Labs  Lab 12/31/17 0335  WBC 8.2  HGB 15.0  HCT 44.0  PLT 191   ------------------------------------------------------------------------------------------------------------------  Chemistries  Recent Labs  Lab 12/31/17 0335  NA 141  K 4.5  CL 109  CO2 23  GLUCOSE 101*  BUN 29*  CREATININE 1.35*  CALCIUM 9.5   ------------------------------------------------------------------------------------------------------------------  Cardiac Enzymes Recent Labs  Lab 12/31/17 0335  TROPONINI <0.03   ------------------------------------------------------------------------------------------------------------------  RADIOLOGY:  Dg Chest Portable 1 View  Result Date: 12/30/2017 CLINICAL DATA:  Shortness of breath, bradycardia, history hypertension EXAM: PORTABLE CHEST 1 VIEW COMPARISON:  Portable exam 1406 hours compared to 04/27/2015 FINDINGS: Upper normal size of cardiac silhouette. Mediastinal contours and pulmonary vascularity normal. Atherosclerotic calcification aorta. Lungs clear. No pleural effusion or pneumothorax. Bones demineralized with BILATERAL AC joint and glenohumeral degenerative changes and evidence of prior cervical spine fusion. IMPRESSION: No acute abnormalities. Electronically Signed   By: Ulyses Southward M.D.   On: 12/30/2017 14:26     ASSESSMENT AND PLAN:    84 year old male with past medical history  of hypertension, atrial fibrillation, cardiomyopathy ejection fraction of 35%, neuropathy, peripheral artery disease, carotid artery disease who presented to the hospital due to dizziness and noted to have bradycardia.  1.  Symptomatic bradycardia-this is a cause of patient's dizziness.  Patient presented to the hospital with heart rates in the mid 30s to low 40s.  Patient is on Cardizem and metoprolol which have both been held. -Heart rate has improved to the 50s this morning.  Patient is presently clinically asymptomatic. - Continue to hold rate controlling meds, follow heart rate, follow for any pauses.  Patient continues to have significant pauses of bradycardia he likely would benefit from a defibrillator/pacemaker given his cardiomyopathy.  This was discussed with the patient's cardiologist.  2.  History of chronic A. fibrillation-currently bradycardic.  Hold rate controlling meds.  Continue Eliquis.  3.  History of peripheral artery disease-continue Pletal.  4.  Essential hypertension-blood pressure slightly uncontrolled. -We will increase the dose of patient's losartan, continue Imdur, follow blood pressure.  5.  GERD-continue Protonix.  6.  Hyperlipidemia-continue atorvastatin.  7.  History of chronic systolic CHF-clinically patient is not in congestive heart failure.  Continue Lasix, Aldactone, losartan, Imdur   All the records are reviewed and case discussed with Care Management/Social Worker. Management plans discussed with the patient, family and they are in agreement.  CODE STATUS: DNR  DVT Prophylaxis: Eliquis  TOTAL TIME TAKING CARE OF THIS PATIENT: 30 minutes.   POSSIBLE D/C IN 1-2 DAYS, DEPENDING ON CLINICAL CONDITION.   [place] SirenSAINANI,Andri Prestia J M.D on 12/31/2017 at 3:13 PM  Between 7am to 6pm - Pager - [phone]  After 6pm go to [email or web address] - Social research officer, governmentpassword [email or web address]  [employer]  Hospitalists  Office   [phone]  CC: Primary care physician; Danella PentonMiller, Mark F, MD

## 2017-12-31 NOTE — Progress Notes (Signed)
[facility]  Patient: Justin BrownMichael Chisolm / Admit Date: 12/30/2017 / Date of Encounter: 12/31/2017, 8:21 AM   Subjective: Patient feels well today.  No evidence of chest pain shortness of breath weakness fatigue or dizziness at this point.  No evidence of congestive heart failure.  Patient has continued telemetry now showing normal sinus rhythm without evidence of symptomatic bradycardia and or advanced heart block.  Heart rate appears to be 55 bpm at rest.  Review of Systems: Positive for: None Negative for: Vision change, hearing change, syncope, dizziness, nausea, vomiting,diarrhea, bloody stool, stomach pain, cough, congestion, diaphoresis, urinary frequency, urinary pain,skin lesions, skin rashes Others previously listed  Objective: Telemetry: Sinus bradycardia Physical Exam: Blood pressure (!) 169/65, pulse (!) 50, temperature 97.6 F (36.4 C), temperature source Oral, resp. rate 16, height 6' (1.829 m), weight 196 lb 8 oz (89.1 kg), SpO2 92 %. Body mass index is 26.65 kg/m. General: Well developed, well nourished, in no acute distress. Head: Normocephalic, atraumatic, sclera non-icteric, no xanthomas, nares are without discharge. Neck: No apparent masses Lungs: Normal respirations with no wheezes, no rhonchi, no rales , no crackles   Heart: Regular rate and rhythm, normal S1 S2, 3+ aortic murmur, no rub, no gallop, PMI is normal size and placement, carotid upstroke normal with  bruit, jugular venous pressure normal Abdomen: Soft, non-tender, non-distended with normoactive bowel sounds. No hepatosplenomegaly. Abdominal aorta is normal size without bruit Extremities: Trace edema, no clubbing, no cyanosis, no ulcers,  Peripheral: 2+ radial, 2+ femoral, 2+ dorsal pedal pulses Neuro: Alert and oriented. Moves all extremities spontaneously. Psych:  Responds to questions appropriately with a normal affect.   Intake/Output Summary (Last 24 hours) at  12/31/2017 0821 Last data filed at 12/31/2017 0700 Gross per 24 hour  Intake -  Output 1975 ml  Net -1975 ml    Inpatient Medications:  . aspirin EC  81 mg Oral Daily  . atorvastatin  80 mg Oral q1800  . cilostazol  100 mg Oral BID  . fluticasone  2 spray Each Nare Daily  . furosemide  20 mg Oral BID  . ipratropium  2 spray Nasal TID  . isosorbide mononitrate  30 mg Oral Daily  . losartan  25 mg Oral Daily  . magnesium oxide  400 mg Oral Daily  . niacin  1,000 mg Oral Daily  . pantoprazole  40 mg Oral Daily  . potassium chloride SA  40 mEq Oral Daily  . sertraline  25 mg Oral Daily  . spironolactone  25 mg Oral Daily   Infusions:   Labs: Recent Labs    12/30/17 1428 12/31/17 0335  NA 137 141  K 4.9 4.5  CL 106 109  CO2 22 23  GLUCOSE 131* 101*  BUN 35* 29*  CREATININE 1.58* 1.35*  CALCIUM 9.7 9.5   No results for input(s): AST, ALT, ALKPHOS, BILITOT, PROT, ALBUMIN in the last 72 hours. Recent Labs    12/30/17 1428 12/31/17 0335  WBC 9.8 8.2  HGB 14.7 15.0  HCT 42.1 44.0  MCV 92.1 92.3  PLT 219 191   Recent Labs    12/30/17 1428 12/30/17 2151 12/31/17 0335  TROPONINI 0.03* <0.03 <0.03   Invalid input(s): POCBNP No results for input(s): HGBA1C in the last 72 hours.   Weights: Filed Weights   12/30/17 1407 12/30/17 1610  Weight: 193 lb (87.5 kg) 196 lb 8 oz (89.1 kg)     Radiology/Studies:  Dg Chest Portable 1 View  Result Date: 12/30/2017 CLINICAL DATA:  Shortness of breath, bradycardia, history hypertension EXAM: PORTABLE CHEST 1 VIEW COMPARISON:  Portable exam 1406 hours compared to 04/27/2015 FINDINGS: Upper normal size of cardiac silhouette. Mediastinal contours and pulmonary vascularity normal. Atherosclerotic calcification aorta. Lungs clear. No pleural effusion or pneumothorax. Bones demineralized with BILATERAL AC joint and glenohumeral degenerative changes and evidence of prior cervical spine fusion. IMPRESSION: No acute abnormalities.  Electronically Signed   By: Ulyses Southward M.D.   On: 12/30/2017 14:26     Assessment and Recommendation  84 y.o. male with known three-vessel coronary artery disease and dense anterior myocardial infarction in the past with a cardiomyopathy with ejection fraction of 25 to 30% essential hypertension mixed hyperlipidemia and paroxysmal nonvalvular atrial fibrillation now with symptomatic bradycardia with 2-1 block likely secondary to above as well as issues with medication management including metoprolol.  After discontinuation of metoprolol the patient now has normal sinus rhythm at 55 to 60 bpm with no further symptoms. 1.  Continue to abstain from metoprolol and/or diltiazem due to bradycardia and 2-1 block 2.  Begin ambulation and follow for symptomatic bradycardia or chronotropic incompetence requiring further pacemaker placement 3.  Continue further treatment of cardiomyopathy with spironolactone and angiotensin receptor blocker 4.  Torsemide for lower extremity edema and congestive heart failure stable at this time 5.  High intensity cholesterol therapy as before 6.  Okay for discharged home if ambulating well with no evidence of chronotropic incompetence and/or symptomatic bradycardia with follow-up with primary radiologist tomorrow for further evaluation and treatment of above.  This includes a possibility of discussion for defibrillator pacemaker has been discussed today 7.  Reinstatement of anticoagulation for further risk reduction in stroke with atrial fibrillation 8.  Single antiplatelet therapy for peripheral vascular disease and defer triple therapy due to risks of bleeding Signed, Arnoldo Hooker M.D. FACC

## 2017-12-31 NOTE — Progress Notes (Signed)
Report given to Ladona Ridgelaylor, RN on 2A. Pt transferred to room 235 via wheelchair. Wife with pt.

## 2017-12-31 NOTE — Progress Notes (Signed)
He is asymptomatic Heart block has resolved Now with sinus bradycardia Transfer to MedSurg floor with cardiac monitoring Discussed with Dr. Vanice SarahSainani  Hibah Odonnell, MD PCCM service Mobile [phone] Pager [phone] 12/31/2017 12:07 PM

## 2017-12-31 NOTE — Progress Notes (Signed)
Talked to doctor Kowalski if pt need to be NPO midnight. Doctor WaldroGwen Poundsn SessionKowaslki states that [facility] and pt need not to be NPO at this time. Will continue to monitor.

## 2017-12-31 NOTE — Progress Notes (Signed)
Pt assisted into chair ar bedside with standby assistance. Gait steady. Pt denies dizziness. Heart rate 40-50 with activity. Pt denies pain or discomfort. Pt reminded to call for assistance before getting out of chair. Pt verbalizes understanding. Call bell within reach. Family at bedside.

## 2018-01-01 LAB — GLUCOSE, CAPILLARY: Glucose-Capillary: 150 mg/dL — ABNORMAL HIGH (ref 65–99)

## 2018-01-01 NOTE — Progress Notes (Signed)
Patients wife called me into the room stating her husband was feeling flushed and feeling short of breath.  I walked into the room and took vital signs and although 02 was normal,  BP was low, HR was also low at 45 in afib/flutter.  I spoke to Dr. Cherlynn KaiserSainani who came to see the patient.  By the time he arrived on the floor the patient was not experiencing any of the above symptoms.  Given instructions to recheck the BP in 1 hr and hold all BP meds this afternoon.  Will continue to monitor.

## 2018-01-01 NOTE — Progress Notes (Signed)
[employer] - [place] at [facility]   PATIENT NAME: Justin Mendez    MR#:  [id number]  DATE OF BIRTH:  Jan 13, 1933  SUBJECTIVE:   Pt. Here due to symptomatic bradycardia.  HR has improved since yesterday.  Discussed w/ Cardiology and plan for transfer to [facility] for AICD placement.    REVIEW OF SYSTEMS:    Review of Systems  Constitutional: Negative for chills and fever.  HENT: Negative for congestion and tinnitus.   Eyes: Negative for blurred vision and double vision.  Respiratory: Negative for cough, shortness of breath and wheezing.   Cardiovascular: Negative for chest pain, orthopnea and PND.  Gastrointestinal: Negative for abdominal pain, diarrhea, nausea and vomiting.  Genitourinary: Negative for dysuria and hematuria.  Neurological: Negative for dizziness, sensory change and focal weakness.  All other systems reviewed and are negative.   Nutrition: Heart Healthy Tolerating Diet: Yes Tolerating PT: Ambulatory   DRUG ALLERGIES:   Allergies  Allergen Reactions  . Percocet [Oxycodone-Acetaminophen] Other (See Comments)    Confusion/hallucinations  . Iodinated Diagnostic Agents Other (See Comments)     Was very "flushed", denies itching, swelling or hives. He states this was over 30 years ago with Ionic contrast and not the current non-ionic contrast. SPM    VITALS:  Blood pressure (!) 80/49, pulse (!) 45, temperature 98 F (36.7 C), resp. rate 18, height 6' (1.829 m), weight 86.7 kg (191 lb 3.2 oz), SpO2 95 %.  PHYSICAL EXAMINATION:   Physical Exam  GENERAL:  84 y.o.-year-old patient lying in bed in no acute distress.  EYES: Pupils equal, round, reactive to light and accommodation. No scleral icterus. Extraocular muscles intact.  HEENT: Head atraumatic, normocephalic. Oropharynx and nasopharynx clear.  NECK:  Supple, no jugular venous distention. No thyroid enlargement, no tenderness.  LUNGS: Normal breath sounds bilaterally, no wheezing, rales,  rhonchi. No use of accessory muscles of respiration.  CARDIOVASCULAR: S1, S2  Irregular, Bradycardic. No murmurs, rubs, or gallops.  ABDOMEN: Soft, nontender, nondistended. Bowel sounds present. No organomegaly or mass.  EXTREMITIES: No cyanosis, clubbing or edema b/l.    NEUROLOGIC: Cranial nerves II through XII are intact. No focal Motor or sensory deficits b/l.   PSYCHIATRIC: The patient is alert and oriented x 3.  SKIN: No obvious rash, lesion, or ulcer.    LABORATORY PANEL:   CBC Recent Labs  Lab 12/31/17 0335  WBC 8.2  HGB 15.0  HCT 44.0  PLT 191   ------------------------------------------------------------------------------------------------------------------  Chemistries  Recent Labs  Lab 12/31/17 0335  NA 141  K 4.5  CL 109  CO2 23  GLUCOSE 101*  BUN 29*  CREATININE 1.35*  CALCIUM 9.5   ------------------------------------------------------------------------------------------------------------------  Cardiac Enzymes Recent Labs  Lab 12/31/17 0335  TROPONINI <0.03   ------------------------------------------------------------------------------------------------------------------  RADIOLOGY:  No results found.   ASSESSMENT AND PLAN:   84 year old male with past medical history of hypertension, atrial fibrillation, cardiomyopathy ejection fraction of 35%, neuropathy, peripheral artery disease, carotid artery disease who presented to the hospital due to dizziness and noted to have bradycardia.  1.  Symptomatic bradycardia-this was the cause of patient's dizziness.  Patient presented to the hospital with heart rates in the mid 30s to low 40s.   -Patient was on high doses of metoprolol which has been held.  Heart rates have improved into the 50s to the 70s today. - Given the fact the patient is intolerant to beta-blockers and his history of atrial fibrillation he would benefit from a pacemaker/AICD.  This was  discussed with cardiology and [facility] a  [facility]. - Patient is clinically asymptomatic presently.  2.  History of chronic A. fibrillation-currently bradycardic.  Hold rate controlling meds.   - holding Eliquis due to AICD placement.   3.  Essential hypertension-blood Pressure improved since yesterday.  - cont. Losartan, Imdur.    4.  GERD-continue Protonix.  5.  Hyperlipidemia-continue atorvastatin.  6.  History of chronic systolic CHF-clinically patient is not in congestive heart failure.  Continue Lasix, Aldactone, losartan, Imdur  [facility].    All the records are reviewed and case discussed with Care Management/Social Worker. Management plans discussed with the patient, family and they are in agreement.  CODE STATUS: DNR  DVT Prophylaxis: Ted's & SCD's  TOTAL TIME TAKING CARE OF THIS PATIENT: 35 minutes.   POSSIBLE D/C IN 1-2 DAYS, DEPENDING ON CLINICAL CONDITION.   [place] SirenSAINANI,VIVEK J M.D on 01/01/2018 at 3:47 PM  Between 7am to 6pm - Pager - [phone]  After 6pm go to [email or web address] - Social research officer, governmentpassword EPAS [email or web address]  [employer] [place] Hospitalists  Office  [phone]  CC: Primary care physician; Danella PentonMiller, Mark F, MD

## 2018-01-01 NOTE — Progress Notes (Signed)
[facility] Cardiology [facility] Encounter Note  Patient: Justin Mendez / Admit Date: 12/30/2017 / Date of Encounter: 01/01/2018, 8:55 AM   Subjective: Patient feels well today.  No evidence of chest pain shortness of breath weakness fatigue or dizziness at this point.  No evidence of congestive heart failure.  Patient has continued telemetry now showing normal sinus rhythm without evidence of symptomatic bradycardia and or advanced heart block.  Heart rate appears to be 55 bpm at rest.  There have been episodes where the patient has had heart rate down into the 20 bpm range. After long discussion yesterday afternoon with the patient and the family it was appropriate for the patient to receive a defibrillator for severe LV systolic dysfunction and cardiomyopathy with previous myocardial infarction.  Additionally the patient has had a significant second-degree advanced heart block with 2-1 block and symptoms and therefore defibrillator pacemaker would be appropriate. We have discussed with the family at that potential transfer for this service would be appropriate at this time and then therefore further management of atrial fibrillation and cardiomyopathy would ensue  Review of Systems: Positive for: None Negative for: Vision change, hearing change, syncope, dizziness, nausea, vomiting,diarrhea, bloody stool, stomach pain, cough, congestion, diaphoresis, urinary frequency, urinary pain,skin lesions, skin rashes Others previously listed  Objective: Telemetry: Sinus bradycardia Physical Exam: Blood pressure (!) 141/82, pulse 71, temperature 97.8 F (36.6 C), temperature source Oral, resp. rate 18, height 6' (1.829 m), weight 191 lb 3.2 oz (86.7 kg), SpO2 97 %. Body mass index is 25.93 kg/m. General: Well developed, well nourished, in no acute distress. Head: Normocephalic, atraumatic, sclera non-icteric, no xanthomas, nares are without discharge. Neck: No apparent masses Lungs: Normal  respirations with no wheezes, no rhonchi, no rales , no crackles   Heart: Regular rate and rhythm, normal S1 S2, 3+ aortic murmur, no rub, no gallop, PMI is normal size and placement, carotid upstroke normal with  bruit, jugular venous pressure normal Abdomen: Soft, non-tender, non-distended with normoactive bowel sounds. No hepatosplenomegaly. Abdominal aorta is normal size without bruit Extremities: Trace edema, no clubbing, no cyanosis, no ulcers,  Peripheral: 2+ radial, 2+ femoral, 2+ dorsal pedal pulses Neuro: Alert and oriented. Moves all extremities spontaneously. Psych:  Responds to questions appropriately with a normal affect.   Intake/Output Summary (Last 24 hours) at 01/01/2018 0855 Last data filed at 12/31/2017 2142 Gross per 24 hour  Intake 460 ml  Output 1100 ml  Net -640 ml    Inpatient Medications:  . aspirin EC  81 mg Oral Daily  . atorvastatin  80 mg Oral q1800  . cilostazol  100 mg Oral BID  . fluticasone  2 spray Each Nare Daily  . furosemide  20 mg Oral BID  . ipratropium  2 spray Nasal TID  . isosorbide mononitrate  30 mg Oral Daily  . losartan  50 mg Oral Daily  . magnesium oxide  400 mg Oral Daily  . niacin  1,000 mg Oral Daily  . pantoprazole  40 mg Oral Daily  . potassium chloride SA  20 mEq Oral Daily  . sertraline  25 mg Oral Daily  . spironolactone  25 mg Oral Daily   Infusions:   Labs: Recent Labs    12/30/17 1428 12/31/17 0335  NA 137 141  K 4.9 4.5  CL 106 109  CO2 22 23  GLUCOSE 131* 101*  BUN 35* 29*  CREATININE 1.58* 1.35*  CALCIUM 9.7 9.5   No results for input(s): AST, ALT, ALKPHOS,  BILITOT, PROT, ALBUMIN in the last 72 hours. Recent Labs    12/30/17 1428 12/31/17 0335  WBC 9.8 8.2  HGB 14.7 15.0  HCT 42.1 44.0  MCV 92.1 92.3  PLT 219 191   Recent Labs    12/30/17 1428 12/30/17 2151 12/31/17 0335  TROPONINI 0.03* <0.03 <0.03   Invalid input(s): POCBNP No results for input(s): HGBA1C in the last 72 hours.    Weights: Filed Weights   12/30/17 1610 12/31/17 1849 01/01/18 0358  Weight: 196 lb 8 oz (89.1 kg) 195 lb 1.6 oz (88.5 kg) 191 lb 3.2 oz (86.7 kg)     Radiology/Studies:  Dg Chest Portable 1 View  Result Date: 12/30/2017 CLINICAL DATA:  Shortness of breath, bradycardia, history hypertension EXAM: PORTABLE CHEST 1 VIEW COMPARISON:  Portable exam 1406 hours compared to 04/27/2015 FINDINGS: Upper normal size of cardiac silhouette. Mediastinal contours and pulmonary vascularity normal. Atherosclerotic calcification aorta. Lungs clear. No pleural effusion or pneumothorax. Bones demineralized with BILATERAL AC joint and glenohumeral degenerative changes and evidence of prior cervical spine fusion. IMPRESSION: No acute abnormalities. Electronically Signed   By: Ulyses SouthwardMark  Boles M.D.   On: 12/30/2017 14:26     Assessment and Recommendation  84 y.o. male with known three-vessel coronary artery disease and dense anterior myocardial infarction in the past with a cardiomyopathy with ejection fraction of 25 to 30% essential hypertension mixed hyperlipidemia and paroxysmal nonvalvular atrial fibrillation now with symptomatic bradycardia with 2-1 block likely secondary to above as well as issues with medication management including metoprolol.  After discontinuation of metoprolol the patient now has normal sinus rhythm at 55 to 60 bpm with no further symptoms. 1.  Continue to abstain from metoprolol and/or diltiazem due to bradycardia and 2-1 block 2.  Proceed to defibrillator pacemaker placement for dilated cardiomyopathy with significant advanced heart block 3.  Continue further treatment of cardiomyopathy with spironolactone and angiotensin receptor blocker and plan to reinstatement of beta-blocker after addition of defibrillator pacemaker 4.  Torsemide for lower extremity edema and congestive heart failure stable at this time 5.  High intensity cholesterol therapy as before 6.  Further consideration and  discussion with transfer of patient [facility] for defibrillator pacemaker as per patient wishes family wishes and we have [facility] with [facility] 7.  Reinstatement of anticoagulation for further risk reduction in stroke with atrial fibrillation after defibrillator pacemaker initiation 8.  Single antiplatelet therapy for peripheral vascular disease and defer triple therapy due to risks of bleeding Signed, Arnoldo HookerBruce Storm Dulski M.D. FACC

## 2018-01-01 NOTE — Discharge Summary (Addendum)
[facility] [place] at [facility]   PATIENT NAME: Justin BrownMichael Mendonsa    MR#:  [id number]  DATE OF BIRTH:  [birth year]  DATE OF ADMISSION:  12/30/2017 ADMITTING PHYSICIAN: Ramonita LabAruna Gouru, MD  DATE OF DISCHARGE: 01/02/2018  PRIMARY CARE PHYSICIAN: Danella PentonMiller, Mark F, MD    ADMISSION DIAGNOSIS:  Heart block [I45.9]  DISCHARGE DIAGNOSIS:  Active Problems:   Heart block AV second degree   SECONDARY DIAGNOSIS:   Past Medical History:  Diagnosis Date  . H/O carotid endarterectomy   . Hearing loss   . High cholesterol   . Hypertension   . Neuropathy   . PAD (peripheral artery disease) [place](HCC)     HOSPITAL COURSE:   84 year old male with past medical history of hypertension, atrial fibrillation, cardiomyopathy ejection fraction of 35%, neuropathy, peripheral artery disease, carotid artery disease who presented to the hospital due to dizziness and noted to have bradycardia.  1.  Symptomatic bradycardia-this was the cause of patient's dizziness.  Patient presented to the hospital with heart rates in the mid 30s to low 40s.   -Patient was on high doses of metoprolol which has been held.  Heart rates have improved into the 50s to the 70s today. - Given the fact the patient is intolerant to beta-blockers and his history of atrial fibrillation he would benefit from a pacemaker/AICD.  This was discussed with cardiology and patient is to be discharged to [facility] for AICD placement.  Dr. Sherryl MangesSteven Klein is accepting physician. - remains asymptomatic presently.   2.  History of chronic A. fibrillation-currently bradycardic.  Hold rate controlling meds.   - holding Eliquis due to AICD placement.   3.  Essential hypertension-blood Pressure improved since yesterday.  - cont. Losartan, Imdur.    4.  GERD-continue Protonix.  5.  Hyperlipidemia-continue atorvastatin.  6.  History of chronic systolic CHF-clinically patient is not in congestive heart failure.  Continue  Aldactone, losartan, Imdur  Pt. Going to [facility] for AICD Placement later today. Dr. Sherryl MangesSteven Klein is the accepting physician.     DISCHARGE CONDITIONS:   Stable  CONSULTS OBTAINED:  Treatment Team:  Lamar BlinksKowalski, Bruce J, MD  DRUG ALLERGIES:   Allergies  Allergen Reactions  . Percocet [Oxycodone-Acetaminophen] Other (See Comments)    Confusion/hallucinations  . Iodinated Diagnostic Agents Other (See Comments)     Was very "flushed", denies itching, swelling or hives. He states this was over 30 years ago with Ionic contrast and not the current non-ionic contrast. SPM    DISCHARGE MEDICATIONS:   Allergies as of 01/01/2018      Reactions   Percocet [oxycodone-acetaminophen] Other (See Comments)   Confusion/hallucinations   Iodinated Diagnostic Agents Other (See Comments)    Was very "flushed", denies itching, swelling or hives. He states this was over 30 years ago with Ionic contrast and not the current non-ionic contrast. SPM      Medication List    STOP taking these medications   diltiazem 240 MG 24 hr capsule Commonly known as:  CARDIZEM CD   furosemide 20 MG tablet Commonly known as:  LASIX   metoprolol succinate 50 MG 24 hr tablet Commonly known as:  TOPROL-XL     TAKE these medications   atorvastatin 80 MG tablet Commonly known as:  LIPITOR Take 80 mg by mouth daily.   clopidogrel 75 MG tablet Commonly known as:  PLAVIX Take 75 mg by mouth daily.   clotrimazole-betamethasone cream Commonly known as:  LOTRISONE Apply 2  application topically as needed.   ELIQUIS 5 MG Tabs tablet Generic drug:  apixaban Take 5 mg by mouth 2 (two) times daily.   fluticasone 50 MCG/ACT nasal spray Commonly known as:  FLONASE SHAKE LQ AND U 1 SPR IEN Q 12 H   ipratropium 0.06 % nasal spray Commonly known as:  ATROVENT Place 2 sprays into the nose 3 (three) times daily.   isosorbide mononitrate 30 MG 24 hr tablet Commonly known as:  IMDUR Take 30 mg by  mouth daily.   losartan 25 MG tablet Commonly known as:  COZAAR Take 25 mg by mouth daily.   MULTI-VITAMINS Tabs Take 1 tablet by mouth daily.   nitroGLYCERIN 0.4 MG SL tablet Commonly known as:  NITROSTAT Place 0.4 mg under the tongue every 5 (five) minutes as needed for chest pain.   pantoprazole 40 MG tablet Commonly known as:  PROTONIX Take 40 mg by mouth daily.   potassium chloride SA 20 MEQ tablet Commonly known as:  K-DUR,KLOR-CON Take 2 tablets (40 mEq total) by mouth daily.   sertraline 25 MG tablet Commonly known as:  ZOLOFT Take 25 mg by mouth daily.   spironolactone 25 MG tablet Commonly known as:  ALDACTONE Take 12.5 mg by mouth daily.         DISCHARGE INSTRUCTIONS:   DIET:  Cardiac diet  DISCHARGE CONDITION:  Stable  ACTIVITY:  Activity as tolerated  OXYGEN:  Home Oxygen: No.   Oxygen Delivery: room air  DISCHARGE LOCATION:  [facility]    If you experience worsening of your admission symptoms, develop shortness of breath, life threatening emergency, suicidal or homicidal thoughts you must seek medical attention immediately by calling 911 or calling your MD immediately  if symptoms less severe.  You Must read complete instructions/literature along with all the possible adverse reactions/side effects for all the Medicines you take and that have been prescribed to you. Take any new Medicines after you have completely understood and accpet all the possible adverse reactions/side effects.   Please note  You were cared for by a hospitalist during your hospital stay. If you have any questions about your discharge medications or the care you received while you were in the hospital after you are discharged, you can call the unit and asked to speak with the hospitalist on call if the hospitalist that took care of you is not available. Once you are discharged, your primary care physician will handle any further medical issues. Please note that NO REFILLS for  any discharge medications will be authorized once you are discharged, as it is imperative that you return to your primary care physician (or establish a relationship with a primary care physician if you do not have one) for your aftercare needs so that they can reassess your need for medications and monitor your lab values.   DATA REVIEW:   CBC Recent Labs  Lab 12/31/17 0335  WBC 8.2  HGB 15.0  HCT 44.0  PLT 191    Chemistries  Recent Labs  Lab 12/31/17 0335  NA 141  K 4.5  CL 109  CO2 23  GLUCOSE 101*  BUN 29*  CREATININE 1.35*  CALCIUM 9.5    Cardiac Enzymes Recent Labs  Lab 12/31/17 0335  TROPONINI <0.03    Microbiology Results  Results for orders placed or performed during the hospital encounter of 12/30/17  MRSA PCR Screening     Status: None   Collection Time: 12/30/17  5:11 PM  Result Value Ref  Range Status   MRSA by PCR NEGATIVE NEGATIVE Final    Comment:        The GeneXpert MRSA Assay (FDA approved for NASAL specimens only), is one component of a comprehensive MRSA colonization surveillance program. It is not intended to diagnose MRSA infection nor to guide or monitor treatment for MRSA infections. Performed at [facility], [address] [place], [place] [address]     RADIOLOGY:  No results found.    Management plans discussed with the patient, family and they are in agreement.  CODE STATUS:     Code Status Orders  (From admission, onward)        Start     Ordered   12/30/17 1609  Do not attempt resuscitation (DNR)  Continuous    Question Answer Comment  In the event of cardiac or respiratory ARREST Do not call a "code blue"   In the event of cardiac or respiratory ARREST Do not perform Intubation, CPR, defibrillation or ACLS   In the event of cardiac or respiratory ARREST Use medication by any route, position, wound care, and other measures to relive pain and suffering. May use oxygen, suction and manual treatment of  airway obstruction as needed for comfort.   Comments rn may pronounce      12/30/17 1608   TOTAL TIME TAKING CARE OF THIS PATIENT: 40 minutes.    Houston Siren M.D on 01/02/2018 at 3:54 PM  Between 7am to 6pm - Pager - [phone]  After 6pm go to [email or web address] - Social research officer, government  [facility] [place] [facility]  Office  [phone]  CC: Primary care physician; Danella Penton, MD

## 2018-01-01 NOTE — Progress Notes (Signed)
BP rechecked, still low.  Patient asymptomatic.  Wife at bedside.  Continue to monitor.

## 2018-01-01 NOTE — Progress Notes (Addendum)
Pt states that he felt a sharp pain on his chest lasted for 2 second at 0335. Happened twice within a minute or two interval. Pt VSS were taken and was on the chart.Pt states that's this new for him. No complaints of chest pain at this time [facility]. Awaiting callback..Will continue to monitor.   Update 0500: Doctor Sheryle Hailiamond called. No new order was place just monitor pt. Also mentioned that on notes pt to continue eliquis by doctor Cherlynn KaiserSainani and Gwen PoundsKowalski. Doctor Sheryle Hailiamond states to let the incoming nurse know and let them notify pt attending doctor in the morning.

## 2018-01-01 NOTE — Plan of Care (Signed)
  Problem: Education: Goal: Knowledge of General Education information will improve Outcome: Progressing   Problem: Health Behavior/Discharge Planning: Goal: Ability to manage health-related needs will improve Outcome: Progressing   Problem: Clinical Measurements: Goal: Ability to maintain clinical measurements within normal limits will improve Outcome: Progressing   Problem: Safety: Goal: Ability to remain free from injury will improve Outcome: Progressing   

## 2018-01-02 LAB — SURGICAL PCR SCREEN
MRSA, PCR: NEGATIVE
Staphylococcus aureus: POSITIVE — AB

## 2018-01-02 MED ORDER — MUPIROCIN 2 % EX OINT
1.0000 "application " | TOPICAL_OINTMENT | Freq: Two times a day (BID) | CUTANEOUS | Status: DC
  Administered 2018-01-02 – 2018-01-04 (×4): 1 via NASAL
  Filled 2018-01-02: qty 22

## 2018-01-02 MED ORDER — CHLORHEXIDINE GLUCONATE CLOTH 2 % EX PADS
6.0000 | MEDICATED_PAD | Freq: Every day | CUTANEOUS | Status: DC
  Administered 2018-01-03: 6 via TOPICAL

## 2018-01-02 MED ORDER — ATORVASTATIN CALCIUM 20 MG PO TABS
80.0000 mg | ORAL_TABLET | Freq: Every day | ORAL | Status: DC
  Administered 2018-01-02 – 2018-01-03 (×2): 80 mg via ORAL
  Filled 2018-01-02 (×2): qty 4

## 2018-01-02 MED ORDER — SODIUM CHLORIDE 0.9 % IV SOLN: INTRAVENOUS | Status: DC

## 2018-01-02 MED ORDER — GENTAMICIN SULFATE 40 MG/ML IJ SOLN: 80.0000 mg | INTRAMUSCULAR | Status: DC

## 2018-01-02 MED ORDER — SODIUM CHLORIDE 0.9 % IV SOLN
INTRAVENOUS | Status: DC
  Administered 2018-01-03 (×2): via INTRAVENOUS

## 2018-01-02 MED ORDER — CEFAZOLIN SODIUM-DEXTROSE 2-4 GM/100ML-% IV SOLN: 2.0000 g | INTRAVENOUS | Status: DC

## 2018-01-02 MED ORDER — SODIUM CHLORIDE 0.9% FLUSH
3.0000 mL | Freq: Two times a day (BID) | INTRAVENOUS | Status: DC
  Administered 2018-01-02 – 2018-01-04 (×4): 3 mL via INTRAVENOUS

## 2018-01-02 NOTE — Care Management Important Message (Signed)
Copy of signed IM left with patient in room.  

## 2018-01-02 NOTE — Progress Notes (Signed)
[facility] [place] at [facility]   PATIENT NAME: Justin BrownMichael Mendez    MR#:  [id number]  DATE OF BIRTH:  1932-12-11  SUBJECTIVE:   Pt. Here due to symptomatic bradycardia.  HR stable overnight and pt. Is not symptomatic. Will Transfer to [facility] today for AICD placement.   REVIEW OF SYSTEMS:    Review of Systems  Constitutional: Negative for chills and fever.  HENT: Negative for congestion and tinnitus.   Eyes: Negative for blurred vision and double vision.  Respiratory: Negative for cough, shortness of breath and wheezing.   Cardiovascular: Negative for chest pain, orthopnea and PND.  Gastrointestinal: Negative for abdominal pain, diarrhea, nausea and vomiting.  Genitourinary: Negative for dysuria and hematuria.  Neurological: Negative for dizziness, sensory change and focal weakness.  All other systems reviewed and are negative.   Nutrition: Heart Healthy Tolerating Diet: Yes Tolerating PT: Ambulatory   DRUG ALLERGIES:   Allergies  Allergen Reactions  . Percocet [Oxycodone-Acetaminophen] Other (See Comments)    Confusion/hallucinations  . Iodinated Diagnostic Agents Other (See Comments)     Was very "flushed", denies itching, swelling or hives. He states this was over 30 years ago with Ionic contrast and not the current non-ionic contrast. SPM    VITALS:  Blood pressure (!) 126/58, pulse 60, temperature (!) 97.4 F (36.3 C), temperature source Oral, resp. rate 18, height 6' (1.829 m), weight 87.7 kg (193 lb 4.8 oz), SpO2 96 %.  PHYSICAL EXAMINATION:   Physical Exam  GENERAL:  84 y.o.-year-old patient lying in bed in no acute distress.  EYES: Pupils equal, round, reactive to light and accommodation. No scleral icterus. Extraocular muscles intact.  HEENT: Head atraumatic, normocephalic. Oropharynx and nasopharynx clear.  NECK:  Supple, no jugular venous distention. No thyroid enlargement, no tenderness.  LUNGS: Normal breath sounds bilaterally,  no wheezing, rales, rhonchi. No use of accessory muscles of respiration.  CARDIOVASCULAR: S1, S2  Irregular, Bradycardic. No murmurs, rubs, or gallops.  ABDOMEN: Soft, nontender, nondistended. Bowel sounds present. No organomegaly or mass.  EXTREMITIES: No cyanosis, clubbing or edema b/l.    NEUROLOGIC: Cranial nerves II through XII are intact. No focal Motor or sensory deficits b/l.   PSYCHIATRIC: The patient is alert and oriented x 3.  SKIN: No obvious rash, lesion, or ulcer.    LABORATORY PANEL:   CBC Recent Labs  Lab 12/31/17 0335  WBC 8.2  HGB 15.0  HCT 44.0  PLT 191   ------------------------------------------------------------------------------------------------------------------  Chemistries  Recent Labs  Lab 12/31/17 0335  NA 141  K 4.5  CL 109  CO2 23  GLUCOSE 101*  BUN 29*  CREATININE 1.35*  CALCIUM 9.5   ------------------------------------------------------------------------------------------------------------------  Cardiac Enzymes Recent Labs  Lab 12/31/17 0335  TROPONINI <0.03   ------------------------------------------------------------------------------------------------------------------  RADIOLOGY:  No results found.   ASSESSMENT AND PLAN:   84 year old male with past medical history of hypertension, atrial fibrillation, cardiomyopathy ejection fraction of 35%, neuropathy, peripheral artery disease, carotid artery disease who presented to the hospital due to dizziness and noted to have bradycardia.  1.  Symptomatic bradycardia-this was the cause of patient's dizziness.  Patient presented to the hospital with heart rates in the mid 30s to low 40s.   - Patient was on high doses of metoprolol which has been held.  Heart rates have improved  - Given the fact the patient is intolerant to beta-blockers and his history of atrial fibrillation he would benefit from a pacemaker/AICD.   - Transfer to [facility] placement - Patient is  clinically asymptomatic presently.  2.  History of chronic A. fibrillation-currently bradycardic.  Cont. To hold rate controlling meds.   - holding Eliquis due to AICD placement.   3.  Essential hypertension-blood Pressure stable.  - cont. Losartan, Imdur.    4.  GERD-continue Protonix.  5.  Hyperlipidemia-continue atorvastatin.  6.  History of chronic systolic CHF-clinically patient is not in congestive heart failure.  Cont. Aldactone, losartan, Imdur  Transfer to [facility] today.     All the records are reviewed and case discussed with Care Management/Social Worker. Management plans discussed with the patient, family and they are in agreement.  CODE STATUS: DNR  DVT Prophylaxis: Ted's & SCD's  TOTAL TIME TAKING CARE OF THIS PATIENT: 25 minutes.   POSSIBLE D/C IN 1-2 DAYS, DEPENDING ON CLINICAL CONDITION.   Houston Siren M.D on 01/02/2018 at 3:26 PM  Between 7am to 6pm - Pager - [phone]  After 6pm go to [email or web address] - Social research officer, government  [facility] [place] [facility]  Office  [phone]  CC: Primary care physician; Danella Penton, MD

## 2018-01-02 NOTE — Progress Notes (Signed)
Patient (and family) agreeable for pacemaker placement tomorrow by Dr. Darrold JunkerParaschos.   [facility] to cancel transport for pacer/defibrilator

## 2018-01-02 NOTE — Progress Notes (Signed)
[facility]  SUBJECTIVE: Patient lying comfortably in bed, denies chest pain or shortness of breath   Vitals:   01/02/18 0744 01/02/18 1034 01/02/18 1145 01/02/18 1611  BP: 121/81 (!) 139/58 (!) 126/58 (!) 105/46  Pulse: (!) 51 (!) 58 60 (!) 43  Resp:    20  Temp: (!) 97.4 F (36.3 C)   98 F (36.7 C)  TempSrc: Oral   Oral  SpO2: 96%   92%  Weight:      Height:         Intake/Output Summary (Last 24 hours) at 01/02/2018 1711 Last data filed at 01/02/2018 1210 Gross per 24 hour  Intake -  Output 1600 ml  Net -1600 ml      PHYSICAL EXAM  General: Well developed, well nourished, in no acute distress HEENT:  Normocephalic and atramatic Neck:  No JVD.  Lungs: Clear bilaterally to auscultation and percussion. Heart: HRRR . Normal S1 and S2 without gallops or murmurs.  Abdomen: Bowel sounds are positive, abdomen soft and non-tender  Msk:  Back normal, normal gait. Normal strength and tone for age. Extremities: No clubbing, cyanosis or edema.   Neuro: Alert and oriented X 3. Psych:  Good affect, responds appropriately   LABS: Basic Metabolic Panel: Recent Labs    12/31/17 0335  NA 141  K 4.5  CL 109  CO2 23  GLUCOSE 101*  BUN 29*  CREATININE 1.35*  CALCIUM 9.5   Liver Function Tests: No results for input(s): AST, ALT, ALKPHOS, BILITOT, PROT, ALBUMIN in the last 72 hours. No results for input(s): LIPASE, AMYLASE in the last 72 hours. CBC: Recent Labs    12/31/17 0335  WBC 8.2  HGB 15.0  HCT 44.0  MCV 92.3  PLT 191   Cardiac Enzymes: Recent Labs    12/30/17 2151 12/31/17 0335  TROPONINI <0.03 <0.03   BNP: Invalid input(s): POCBNP D-Dimer: No results for input(s): DDIMER in the last 72 hours. Hemoglobin A1C: No results for input(s): HGBA1C in the last 72 hours. Fasting Lipid Panel: No results for input(s): CHOL, HDL, LDLCALC, TRIG, CHOLHDL, LDLDIRECT in the last 72 hours. Thyroid Function Tests: Recent Labs    12/31/17 0335  TSH 2.301    Anemia Panel: No results for input(s): VITAMINB12, FOLATE, FERRITIN, TIBC, IRON, RETICCTPCT in the last 72 hours.  No results found.   Echo  TELEMETRY: Sinus bradycardia 45 bpm:  ASSESSMENT AND PLAN:  Active Problems:   Heart block AV second degree    1.  Sinus bradycardia in the 40s 2.  Transient to second-degree AV block  Recommendations  1.  Dual chamber pacemaker implantation scheduled for 01/03/2018.  The risks, benefits alternatives of permanent pacemaker implantation were explained to the patient and informed consent was obtained.   Marcina MillardAlexander Avika Carbine, MD, PhD, [facility] 01/02/2018 5:11 PM

## 2018-01-02 NOTE — Progress Notes (Signed)
Patient remained hemodynamically stable overnight without any acute event. Remained in asymptomatic afib with HR in the 50s-60s

## 2018-01-02 NOTE — Progress Notes (Signed)
Spoke to [facility] ([phone]).  [facility] is on diversion right now and they state that it is unlikely that patient will be transferred today.    Patient is stable at this time, sinus brady ~50bpm.     

## 2018-01-03 ENCOUNTER — Inpatient Hospital Stay: Payer: Medicare Other

## 2018-01-03 ENCOUNTER — Inpatient Hospital Stay: Payer: Medicare Other | Admitting: Anesthesiology

## 2018-01-03 ENCOUNTER — Encounter: Payer: Self-pay | Admitting: *Deleted

## 2018-01-03 ENCOUNTER — Encounter
Admission: EM | Disposition: A | Discharge status: Home / Self Care | Payer: Self-pay | Source: Home / Self Care | Attending: Specialist

## 2018-01-03 DIAGNOSIS — Z950 Presence of cardiac pacemaker: Secondary | ICD-10-CM

## 2018-01-03 HISTORY — PX: PACEMAKER INSERTION: SHX728

## 2018-01-03 HISTORY — DX: Presence of cardiac pacemaker: Z95.0

## 2018-01-03 SURGERY — INSERTION, CARDIAC PACEMAKER
Anesthesia: General | Laterality: Left | Wound class: Clean

## 2018-01-03 MED ORDER — ONDANSETRON HCL 4 MG/2ML IJ SOLN
INTRAMUSCULAR | Status: AC
  Filled 2018-01-03: qty 2

## 2018-01-03 MED ORDER — CEFAZOLIN SODIUM-DEXTROSE 1-4 GM/50ML-% IV SOLN
1.0000 g | Freq: Four times a day (QID) | INTRAVENOUS | Status: AC
  Administered 2018-01-03 – 2018-01-04 (×3): 1 g via INTRAVENOUS
  Filled 2018-01-03 (×3): qty 50

## 2018-01-03 MED ORDER — METOPROLOL SUCCINATE ER 25 MG PO TB24
50.0000 mg | ORAL_TABLET | Freq: Two times a day (BID) | ORAL | Status: DC
Start: 2018-01-03 — End: 2018-01-03
  Filled 2018-01-03: qty 2

## 2018-01-03 MED ORDER — FENTANYL CITRATE (PF) 100 MCG/2ML IJ SOLN
INTRAMUSCULAR | Status: AC
  Filled 2018-01-03: qty 2

## 2018-01-03 MED ORDER — CEFAZOLIN SODIUM-DEXTROSE 2-4 GM/100ML-% IV SOLN
INTRAVENOUS | Status: AC
  Filled 2018-01-03: qty 100

## 2018-01-03 MED ORDER — PROPOFOL 10 MG/ML IV BOLUS
INTRAVENOUS | Status: AC
  Filled 2018-01-03: qty 20

## 2018-01-03 MED ORDER — GENTAMICIN SULFATE 40 MG/ML IJ SOLN
INTRAMUSCULAR | Status: AC
  Filled 2018-01-03: qty 2

## 2018-01-03 MED ORDER — KETAMINE HCL 50 MG/ML IJ SOLN
INTRAMUSCULAR | Status: AC
  Filled 2018-01-03: qty 10

## 2018-01-03 MED ORDER — LIDOCAINE 1 % OPTIME INJ - NO CHARGE
INTRAMUSCULAR | Status: DC | PRN
  Administered 2018-01-03: 30 mL

## 2018-01-03 MED ORDER — FENTANYL CITRATE (PF) 100 MCG/2ML IJ SOLN
25.0000 ug | INTRAMUSCULAR | Status: DC | PRN
  Administered 2018-01-03 (×2): 25 ug via INTRAVENOUS

## 2018-01-03 MED ORDER — FENTANYL CITRATE (PF) 100 MCG/2ML IJ SOLN
INTRAMUSCULAR | Status: AC
  Administered 2018-01-03: 25 ug via INTRAVENOUS
  Filled 2018-01-03: qty 2

## 2018-01-03 MED ORDER — ONDANSETRON HCL 4 MG/2ML IJ SOLN: 4.0000 mg | Freq: Four times a day (QID) | INTRAMUSCULAR | Status: DC | PRN

## 2018-01-03 MED ORDER — ONDANSETRON HCL 4 MG/2ML IJ SOLN: 4.0000 mg | Freq: Once | INTRAMUSCULAR | Status: DC | PRN

## 2018-01-03 MED ORDER — MIDAZOLAM HCL 2 MG/2ML IJ SOLN
INTRAMUSCULAR | Status: AC
  Filled 2018-01-03: qty 2

## 2018-01-03 MED ORDER — MIDAZOLAM HCL 5 MG/5ML IJ SOLN
INTRAMUSCULAR | Status: DC | PRN
  Administered 2018-01-03: 1 mg via INTRAVENOUS

## 2018-01-03 MED ORDER — METOPROLOL SUCCINATE ER 50 MG PO TB24
50.0000 mg | ORAL_TABLET | Freq: Once | ORAL | Status: AC
  Administered 2018-01-03: 50 mg via ORAL
  Filled 2018-01-03: qty 1

## 2018-01-03 MED ORDER — CEFAZOLIN SODIUM-DEXTROSE 2-3 GM-%(50ML) IV SOLR
INTRAVENOUS | Status: DC | PRN
  Administered 2018-01-03: 2 g via INTRAVENOUS

## 2018-01-03 MED ORDER — FENTANYL CITRATE (PF) 100 MCG/2ML IJ SOLN
INTRAMUSCULAR | Status: DC | PRN
  Administered 2018-01-03: 50 ug via INTRAVENOUS

## 2018-01-03 MED ORDER — PROPOFOL 500 MG/50ML IV EMUL
INTRAVENOUS | Status: DC | PRN
  Administered 2018-01-03: 25 ug/kg/min via INTRAVENOUS

## 2018-01-03 MED ORDER — METOPROLOL SUCCINATE ER 50 MG PO TB24
50.0000 mg | ORAL_TABLET | Freq: Two times a day (BID) | ORAL | Status: DC
  Administered 2018-01-04: 50 mg via ORAL
  Filled 2018-01-03: qty 1
  Filled 2018-01-03: qty 2

## 2018-01-03 MED ORDER — LIDOCAINE HCL (PF) 2 % IJ SOLN
INTRAMUSCULAR | Status: AC
  Filled 2018-01-03: qty 10

## 2018-01-03 SURGICAL SUPPLY — 35 items
BAG DECANTER FOR FLEXI CONT (MISCELLANEOUS) ×3 IMPLANT
BRUSH SCRUB EZ  4% CHG (MISCELLANEOUS) ×2
BRUSH SCRUB EZ 4% CHG (MISCELLANEOUS) ×1 IMPLANT
CABLE SURG 12 DISP A/V CHANNEL (MISCELLANEOUS) ×3 IMPLANT
CANISTER SUCT 1200ML W/VALVE (MISCELLANEOUS) ×3 IMPLANT
CHLORAPREP W/TINT 26ML (MISCELLANEOUS) ×3 IMPLANT
COVER LIGHT HANDLE STERIS (MISCELLANEOUS) ×6 IMPLANT
COVER MAYO STAND STRL (DRAPES) ×3 IMPLANT
DRAPE C-ARM XRAY 36X54 (DRAPES) ×3 IMPLANT
DRSG TEGADERM 4X4.75 (GAUZE/BANDAGES/DRESSINGS) ×3 IMPLANT
DRSG TELFA 4X3 1S NADH ST (GAUZE/BANDAGES/DRESSINGS) ×3 IMPLANT
ELECT REM PT RETURN 9FT ADLT (ELECTROSURGICAL) ×3
ELECTRODE REM PT RTRN 9FT ADLT (ELECTROSURGICAL) ×1 IMPLANT
GLOVE BIO SURGEON STRL SZ7.5 (GLOVE) ×3 IMPLANT
GLOVE BIO SURGEON STRL SZ8 (GLOVE) ×3 IMPLANT
GOWN STRL REUS W/ TWL LRG LVL3 (GOWN DISPOSABLE) ×1 IMPLANT
GOWN STRL REUS W/ TWL XL LVL3 (GOWN DISPOSABLE) ×1 IMPLANT
GOWN STRL REUS W/TWL LRG LVL3 (GOWN DISPOSABLE) ×3
GOWN STRL REUS W/TWL XL LVL3 (GOWN DISPOSABLE) ×3
IMMOBILIZER SHDR MD LX WHT (SOFTGOODS) IMPLANT
IMMOBILIZER SHDR XL LX WHT (SOFTGOODS) IMPLANT
INTRO PACEMKR SHEATH II 7FR (MISCELLANEOUS) ×3
INTRODUCER PACEMKR SHTH II 7FR (MISCELLANEOUS) ×1 IMPLANT
IPG PACE AZUR XT DR MRI W1DR01 (Pacemaker) IMPLANT
IV NS 500ML (IV SOLUTION) ×3
IV NS 500ML BAXH (IV SOLUTION) ×1 IMPLANT
KIT TURNOVER KIT A (KITS) ×3 IMPLANT
LABEL OR SOLS (LABEL) ×3 IMPLANT
LEAD CAPSURE NOVUS 5076-52CM (Lead) ×2 IMPLANT
LEAD CAPSURE NOVUS 5076-58CM (Lead) ×2 IMPLANT
MARKER SKIN DUAL TIP RULER LAB (MISCELLANEOUS) ×3 IMPLANT
PACE AZURE XT DR MRI W1DR01 (Pacemaker) ×3 IMPLANT
PACK PACE INSERTION (MISCELLANEOUS) ×3 IMPLANT
PAD ONESTEP ZOLL R SERIES ADT (MISCELLANEOUS) ×3 IMPLANT
SUT SILK 0 SH 30 (SUTURE) ×9 IMPLANT

## 2018-01-03 NOTE — Anesthesia Postprocedure Evaluation (Signed)
Anesthesia Post Note  Patient: Justin BrownMichael Fossum  Procedure(s) Performed: INSERTION PACEMAKER-INITIAL DUAL CHAMBER (Left )  Patient location during evaluation: PACU Anesthesia Type: General Level of consciousness: awake and alert Pain management: pain level controlled Vital Signs Assessment: post-procedure vital signs reviewed and stable Respiratory status: spontaneous breathing, nonlabored ventilation, respiratory function stable and patient connected to nasal cannula oxygen Cardiovascular status: blood pressure returned to baseline and stable Postop Assessment: no apparent nausea or vomiting Anesthetic complications: no     Last Vitals:  Vitals:   01/03/18 1610 01/03/18 1944  BP: 127/60 127/76  Pulse: 76 68  Resp: 16   Temp: 36.5 C 36.5 C  SpO2: 99% 91%    Last Pain:  Vitals:   01/03/18 2104  TempSrc:   PainSc: 2                  Lenard SimmerAndrew Antanette Richwine

## 2018-01-03 NOTE — Op Note (Signed)
[facility]   01/03/2018                     1:38 PM  PATIENT:  Justin Mendez    PRE-OPERATIVE DIAGNOSIS:  sinus bradycardia  POST-OPERATIVE DIAGNOSIS:  Same  PROCEDURE:  INSERTION PACEMAKER-INITIAL DUAL CHAMBER  SURGEON:  Marcina MillardAlexander Rashaun Curl, MD    ANESTHESIA:     PREOPERATIVE INDICATIONS:  Justin BrownMichael Molino is a  84 y.o. male with a diagnosis of sinus bradycardia who failed conservative measures and elected for surgical management.    The risks benefits and alternatives were discussed with the patient preoperatively including but not limited to the risks of infection, bleeding, cardiopulmonary complications, the need for revision surgery, among others, and the patient was willing to proceed.   OPERATIVE PROCEDURE: Patient brought to the operating room in a fasting state.  The left pectoral region was prepped and draped in usual sterile manner.  Anesthesia was obtained 1% lidocaine locally.  A 6 cm incision was performed over the left pectoral region.  The pacemaker pocket was generated by electrocautery and blunt dissection.  This was obtained to the left subclavian vein by fine-needle aspiration.  MRI compatible leads were positioned to the right ventricular apical septum ( Medtronic [id number] ) and right atrial appendage ( Medtronic [id number] ) under fluoroscopic guidance.  After proper thresholds were obtained the leads were sutured in place.  The leads were connected to a MRI compatible dual-chamber rate responsive pacemaker generator ( Medtronic [id number] ).  Pacemaker pocket was irrigated gentamicin solution.  Pacemaker generator was positioned into the pocket and the pocket was closed with 2-0 and 4-0 Vicryl, respectively.  Steri-Strips and pressure dressing were applied.  Postprocedural interrogation revealed appropriate dual-chamber atrial and ventricular sensing and pacing thresholds.  There were no periprocedural complications.

## 2018-01-03 NOTE — Plan of Care (Signed)
Afib 50s this shift.  Voices understanding of pending pacemaker insertion.

## 2018-01-03 NOTE — Anesthesia Preprocedure Evaluation (Signed)
Anesthesia Evaluation  Patient identified by MRN, date of birth, ID band Patient awake    Reviewed: Allergy & Precautions, NPO status , Patient's Chart, lab work & pertinent test results  History of Anesthesia Complications Negative for: history of anesthetic complications  Airway Mallampati: III       Dental  (+) Upper Dentures   Pulmonary sleep apnea and Continuous Positive Airway Pressure Ventilation , neg COPD, former smoker,           Cardiovascular hypertension, Pt. on medications + Past MI and +CHF (hx, no problems presently)  + dysrhythmias (hx of Afib, chemically converted) (-) Valvular Problems/Murmurs     Neuro/Psych neg Seizures    GI/Hepatic Neg liver ROS, GERD  Medicated and Controlled,  Endo/Other  neg diabetes  Renal/GU negative Renal ROS     Musculoskeletal   Abdominal   Peds  Hematology   Anesthesia Other Findings   Reproductive/Obstetrics                             Anesthesia Physical Anesthesia Plan  ASA: III  Anesthesia Plan: General   Post-op Pain Management:    Induction: Intravenous  PONV Risk Score and Plan: 2 and TIVA and Propofol infusion  Airway Management Planned: Nasal Cannula  Additional Equipment:   Intra-op Plan:   Post-operative Plan:   Informed Consent: I have reviewed the patients History and Physical, chart, labs and discussed the procedure including the risks, benefits and alternatives for the proposed anesthesia with the patient or authorized representative who has indicated his/her understanding and acceptance.     Plan Discussed with:   Anesthesia Plan Comments:         Anesthesia Quick Evaluation

## 2018-01-03 NOTE — Care Management (Signed)
Patient for Dual chamber pacemaker implantation scheduled for today. Reported that at baseline patient is independent and still works at [employer].  Plavix for outpatient coagulation.  Staff to ambulate patient when medically cleared, and if needed order PT consult

## 2018-01-03 NOTE — Progress Notes (Signed)
[facility] [place] [facility]   PATIENT NAME: Justin BrownMichael Gienger    MR#:  [id number]  DATE OF BIRTH:  10-Oct-1932  SUBJECTIVE:   Pt. Here due to symptomatic bradycardia.  Pt. Was going to be transferred for AICD initially but now going for PPM placement today. No acute events or complaints presently or overnight.   REVIEW OF SYSTEMS:    Review of Systems  Constitutional: Negative for chills and fever.  HENT: Negative for congestion and tinnitus.   Eyes: Negative for blurred vision and double vision.  Respiratory: Negative for cough, shortness of breath and wheezing.   Cardiovascular: Negative for chest pain, orthopnea and PND.  Gastrointestinal: Negative for abdominal pain, diarrhea, nausea and vomiting.  Genitourinary: Negative for dysuria and hematuria.  Neurological: Negative for dizziness, sensory change and focal weakness.  All other systems reviewed and are negative.   Nutrition: Heart Healthy Tolerating Diet: Yes Tolerating PT: Ambulatory   DRUG ALLERGIES:   Allergies  Allergen Reactions  . Percocet [Oxycodone-Acetaminophen] Other (See Comments)    Confusion/hallucinations  . Iodinated Diagnostic Agents Other (See Comments)     Was very "flushed", denies itching, swelling or hives. He states this was over 30 years ago with Ionic contrast and not the current non-ionic contrast. SPM    VITALS:  Blood pressure 140/68, pulse 86, temperature 97.8 F (36.6 C), resp. rate 13, height 6' (1.829 m), weight 87.1 kg (192 lb), SpO2 97 %.  PHYSICAL EXAMINATION:   Physical Exam  GENERAL:  84 y.o.-year-old patient lying in bed in no acute distress.  EYES: Pupils equal, round, reactive to light and accommodation. No scleral icterus. Extraocular muscles intact.  HEENT: Head atraumatic, normocephalic. Oropharynx and nasopharynx clear.  NECK:  Supple, no jugular venous distention. No thyroid enlargement, no tenderness.  LUNGS: Normal breath sounds bilaterally,  no wheezing, rales, rhonchi. No use of accessory muscles of respiration.  CARDIOVASCULAR: S1, S2  Irregular, Bradycardic. No murmurs, rubs, or gallops.  ABDOMEN: Soft, nontender, nondistended. Bowel sounds present. No organomegaly or mass.  EXTREMITIES: No cyanosis, clubbing or edema b/l.    NEUROLOGIC: Cranial nerves II through XII are intact. No focal Motor or sensory deficits b/l.   PSYCHIATRIC: The patient is alert and oriented x 3.  SKIN: No obvious rash, lesion, or ulcer.    LABORATORY PANEL:   CBC Recent Labs  Lab 12/31/17 0335  WBC 8.2  HGB 15.0  HCT 44.0  PLT 191   ------------------------------------------------------------------------------------------------------------------  Chemistries  Recent Labs  Lab 12/31/17 0335  NA 141  K 4.5  CL 109  CO2 23  GLUCOSE 101*  BUN 29*  CREATININE 1.35*  CALCIUM 9.5   ------------------------------------------------------------------------------------------------------------------  Cardiac Enzymes Recent Labs  Lab 12/31/17 0335  TROPONINI <0.03   ------------------------------------------------------------------------------------------------------------------  RADIOLOGY:  Dg Chest Port 1 View  Result Date: 01/03/2018 CLINICAL DATA:  Pacemaker insertion. EXAM: PORTABLE CHEST 1 VIEW COMPARISON:  No recent prior. FINDINGS: Cardiac pacer with lead tips over the right atrium and right ventricle. Monitoring pads and wiring noted. Cardiomegaly with normal pulmonary vascularity. No focal infiltrate. No pleural effusion or pneumothorax. IMPRESSION: Cardiac pacer with lead tips over the right atrium right ventricle. No pulmonary venous congestion. No acute pulmonary disease. Electronically Signed   By: Maisie Fushomas  Register   On: 01/03/2018 14:21   Dg C-arm 1-60 Min-no Report  Result Date: 01/03/2018 Fluoroscopy was utilized by the requesting physician.  No radiographic interpretation.     ASSESSMENT AND PLAN:   84 year old  male with past medical history of hypertension, atrial fibrillation, cardiomyopathy ejection fraction of 35%, neuropathy, peripheral artery disease, carotid artery disease who presented to the hospital due to dizziness and noted to have bradycardia.  1.  Symptomatic bradycardia-this was the cause of patient's dizziness.  Patient presented to the hospital with heart rates in the mid 30s to low 40s.   - Patient was on high doses of metoprolol which continues to be held. Heart rates have improved  - Given the fact the patient is intolerant to beta-blockers and his history of atrial fibrillation he would benefit from a pacemaker. Plan for pacemaker placement as per Cards (Dr. Darrold Junker)   - Patient is clinically asymptomatic presently.  2.  History of chronic A. fibrillation-currently bradycardic.  Cont. To hold rate controlling meds.   - plan for pacemaker placement today. Can resume Eliquis in a.m. Tomorrow upon discharge.   3.  Essential hypertension-blood Pressure stable.  - cont. Losartan, Imdur.    4.  GERD-continue Protonix.  5.  Hyperlipidemia-continue atorvastatin.  6.  History of chronic systolic CHF-clinically patient is not in congestive heart failure.  Cont. Aldactone, losartan, Imdur  Likely discharge home tomorrow.    All the records are reviewed and case discussed with Care Management/Social Worker. Management plans discussed with the patient, family and they are in agreement.  CODE STATUS: DNR  DVT Prophylaxis: Ted's & SCD's  TOTAL TIME TAKING CARE OF THIS PATIENT: 25 minutes.   POSSIBLE D/C IN 1-2 DAYS, DEPENDING ON CLINICAL CONDITION.   Houston Siren M.D on 01/03/2018 at 3:16 PM  Between 7am to 6pm - Pager - [phone]  After 6pm go to [email or web address] - Social research officer, government  [facility]  Office  [phone]  CC: Primary care physician; Danella Penton, MD

## 2018-01-03 NOTE — Transfer of Care (Signed)
Immediate Anesthesia Transfer of Care Note  Patient: Justin BrownMichael Mendez  Procedure(s) Performed: INSERTION PACEMAKER-INITIAL DUAL CHAMBER (Left )  Patient Location: PACU  Anesthesia Type:General  Level of Consciousness: awake, alert  and oriented  Airway & Oxygen Therapy: Patient Spontanous Breathing  Post-op Assessment: Report given to RN and Post -op Vital signs reviewed and stable  Post vital signs: Reviewed and stable  Last Vitals:  Vitals Value Taken Time  BP 138/73 01/03/2018  1:39 PM  Temp    Pulse 95 01/03/2018  1:40 PM  Resp 20 01/03/2018  1:40 PM  SpO2 95 % 01/03/2018  1:40 PM  Vitals shown include unvalidated device data.  Last Pain:  Vitals:   01/03/18 1155  TempSrc: Temporal  PainSc: 0-No pain      Patients Stated Pain Goal: 0 (01/01/18 0340)  Complications: No apparent anesthesia complications

## 2018-01-03 NOTE — Plan of Care (Signed)
Pt s/p PPM placement this morning no incision site complications, pacemaker functioning properly, pain controlled via PRN pain meds, pt is able to ambulate to bathroom with standby assistance.    Problem: Clinical Measurements: Goal: Ability to maintain clinical measurements within normal limits will improve Outcome: Progressing   Problem: Activity: Goal: Risk for activity intolerance will decrease Outcome: Progressing   Problem: Pain Managment: Goal: General experience of comfort will improve Outcome: Progressing

## 2018-01-03 NOTE — Anesthesia Post-op Follow-up Note (Signed)
Anesthesia QCDR form completed.        

## 2018-01-04 MED ORDER — ELIQUIS 5 MG PO TABS: 5.0000 mg | ORAL_TABLET | Freq: Two times a day (BID) | ORAL | 11 refills | Status: AC

## 2018-01-04 MED ORDER — CEPHALEXIN 500 MG PO CAPS: 500.0000 mg | ORAL_CAPSULE | Freq: Two times a day (BID) | ORAL | 0 refills | Status: AC

## 2018-01-04 MED ORDER — POTASSIUM CHLORIDE CRYS ER 20 MEQ PO TBCR: 20.0000 meq | EXTENDED_RELEASE_TABLET | Freq: Every day | ORAL | 0 refills | Status: DC

## 2018-01-04 MED ORDER — CHLORHEXIDINE GLUCONATE CLOTH 2 % EX PADS
6.0000 | MEDICATED_PAD | Freq: Every day | CUTANEOUS | Status: DC
  Administered 2018-01-04: 6 via TOPICAL

## 2018-01-04 MED ORDER — METOPROLOL SUCCINATE ER 50 MG PO TB24: 50.0000 mg | ORAL_TABLET | Freq: Two times a day (BID) | ORAL | 0 refills | Status: AC

## 2018-01-04 MED ORDER — MUPIROCIN 2 % EX OINT: 1.0000 "application " | TOPICAL_OINTMENT | Freq: Two times a day (BID) | CUTANEOUS | 0 refills | Status: DC

## 2018-01-04 NOTE — Progress Notes (Signed)
Pt to be discharged today. Iv's and tele removed. disch instructions and prescrips and work note given to pt and wife. disch via w.c. Wife to transport in car.

## 2018-01-04 NOTE — Discharge Instructions (Addendum)
Pacemaker Implantation, Adult, Care After This sheet gives you information about how to care for yourself after your procedure. Your health care provider may also give you more specific instructions. If you have problems or questions, contact your health care provider. What can I expect after the procedure? After the procedure, it is common to have:  Mild pain.  Slight bruising.  Some swelling over the incision.  A slight bump over the skin where the device was placed. Sometimes, it is possible to feel the device under the skin. This is normal.  Follow these instructions at home: Medicines  Take over-the-counter and prescription medicines only as told by your health care provider.  If you were prescribed an antibiotic medicine, take it as told by your health care provider. Do not stop taking the antibiotic even if you start to feel better. Wound care  Do not remove the bandage on your chest until directed to do so by your health care provider.  After your bandage is removed, you may see pieces of tape called skin adhesive strips over the area where the cut was made (incision site). Let them fall off on their own.  Check the incision site every day to make sure it is not infected, bleeding, or starting to pull apart.  Do not use lotions or ointments near the incision site unless directed to do so.  Keep the incision area clean and dry for 2-3 days after the procedure or as directed by your health care provider. It takes several weeks for the incision site to completely heal.  Do not take baths, swim, or use a hot tub for 7-10 days or as otherwise directed by your health care provider. Activity  Do not drive or use heavy machinery while taking prescription pain medicine.  Do not drive for 24 hours if you were given a medicine to help you relax (sedative).  Check with your health care provider before you start to drive or play sports.  Avoid sudden jerking, pulling, or chopping  movements that pull your upper arm far away from your body. Avoid these movements for at least 6 weeks or as long as told by your health care provider.  Do not lift your upper arm above your shoulders for at least 6 weeks or as long as told by your health care provider. This means no tennis, golf, or swimming.  You may go back to work when your health care provider says it is okay. Pacemaker care  You may be shown how to transfer data from your pacemaker through the phone to your health care provider.  Always let all health care providers know about your pacemaker before you have any medical procedures or tests.  Wear a medical ID bracelet or necklace stating that you have a pacemaker. Carry a pacemaker ID card with you at all times.  Your pacemaker battery will last for 5-15 years. Routine checks by your health care provider will let the health care provider know when the battery is starting to run down. The pacemaker will need to be replaced when the battery starts to run down.  Do not use amateur radio equipment or electric welding torches. Other electrical devices are safe to use, including power tools, lawn mowers, and speakers. If you are unsure of whether something is safe to use, ask your health care provider.  When using your cell phone, hold it to the ear opposite the pacemaker. Do not leave your cell phone in a pocket over the pacemaker.    pacemaker.  Avoid places or objects that have a strong electric or magnetic field, including: ? Airport Actuarysecurity gates. When at the airport, let officials know that you have a pacemaker. ? Power plants. ? Large electrical generators. ? Radiofrequency transmission towers, such as cell phone and radio towers. General instructions  Weigh yourself every day. If you suddenly gain weight, fluid may be building up in your body.  Keep all follow-up visits as told by your health care provider. This is important. Contact a health care provider if:  You  gain weight suddenly.  Your legs or feet swell.  It feels like your heart is fluttering or skipping beats (heart palpitations).  You have chills or a fever.  You have more redness, swelling, or pain around your incisions.  You have more fluid or blood coming from your incisions.  Your incisions feel warm to the touch.  You have pus or a bad smell coming from your incisions. Get help right away if:  You have chest pain.  You have trouble breathing or are short of breath.  You become extremely tired.  You are light-headed or you faint. This information is not intended to replace advice given to you by your health care provider. Make sure you discuss any questions you have with your health care provider. Document Released: 01/19/2005 Document Revised: 04/13/2016 Document Reviewed: 04/13/2016 Elsevier Interactive Patient Education  2018 [employer] Able to shower tomorrow but dont have shower directly on wound. Can take outer bandage off tomorrow but leave steri strips on. Restart Eliquis on Monday

## 2018-01-04 NOTE — Plan of Care (Signed)
AV Pacing noted on telemetry.  No dizziness with ambulation in room.

## 2018-01-04 NOTE — Discharge Summary (Signed)
[employer] - [place] at [facility]   PATIENT NAME: Justin Mendez    MR#:  [id number]  DATE OF BIRTH:  09/20/1932  DATE OF ADMISSION:  12/30/2017 ADMITTING PHYSICIAN: Ramonita Lab, MD  DATE OF DISCHARGE: 01/04/2018 10:50 AM  PRIMARY CARE PHYSICIAN: Danella Penton, MD    ADMISSION DIAGNOSIS:  Heart block [I45.9]  DISCHARGE DIAGNOSIS:  Active Problems:   Heart block AV second degree   SECONDARY DIAGNOSIS:   Past Medical History:  Diagnosis Date  . H/O carotid endarterectomy   . Hearing loss   . High cholesterol   . Hypertension   . Neuropathy   . PAD (peripheral artery disease) (HCC)     HOSPITAL COURSE:   1.  Second-degree heart block and symptomatic bradycardia.  The patient's beta-blocker and calcium channel blocker was held.  Heart rate still remain low pacemaker was placed by Dr. Evette Georges.  Dr. Evette Georges recommended Keflex for 1 week.  Restarting beta-blocker. 2.  History of chronic atrial fibrillation.  Can go back on Eliquis on Monday.  Resume metoprolol 50 mg twice a day as per Dr. Evette Georges after pacemaker placement. 3.  Essential hypertension.  On losartan, metoprolol and Imdur and Spironolactone 4.  GERD on Protonix 5.  Hyperlipidemia unspecified on atorvastatin 6.  History of chronic systolic congestive heart failure.  Not in congestive heart failure at this time.  Continue Aldactone, losartan, metoprolol.  DISCHARGE CONDITIONS:   Satisfactory  CONSULTS OBTAINED:  Treatment Team:  Lamar Blinks, MD Marcina Millard, MD  DRUG ALLERGIES:   Allergies  Allergen Reactions  . Percocet [Oxycodone-Acetaminophen] Other (See Comments)    Confusion/hallucinations  . Iodinated Diagnostic Agents Other (See Comments)     Was very "flushed", denies itching, swelling or hives. He states this was over 30 years ago with Ionic contrast and not the current non-ionic contrast. SPM    DISCHARGE MEDICATIONS:   Allergies as of 01/04/2018       Reactions   Percocet [oxycodone-acetaminophen] Other (See Comments)   Confusion/hallucinations   Iodinated Diagnostic Agents Other (See Comments)    Was very "flushed", denies itching, swelling or hives. He states this was over 30 years ago with Ionic contrast and not the current non-ionic contrast. SPM      Medication List    STOP taking these medications   diltiazem 240 MG 24 hr capsule Commonly known as:  CARDIZEM CD   furosemide 20 MG tablet Commonly known as:  LASIX   potassium chloride SA 20 MEQ tablet Commonly known as:  K-DUR,KLOR-CON     TAKE these medications   atorvastatin 80 MG tablet Commonly known as:  LIPITOR Take 80 mg by mouth daily.   cephALEXin 500 MG capsule Commonly known as:  KEFLEX Take 1 capsule (500 mg total) by mouth 2 (two) times daily for 10 days.   clopidogrel 75 MG tablet Commonly known as:  PLAVIX Take 75 mg by mouth daily.   clotrimazole-betamethasone cream Commonly known as:  LOTRISONE Apply 2 application topically as needed.   ELIQUIS 5 MG Tabs tablet Generic drug:  apixaban Take 1 tablet (5 mg total) by mouth 2 (two) times daily. Start taking on:  01/06/2018 What changed:  These instructions start on 01/06/2018. If you are unsure what to do until then, ask your doctor or other care provider.   fluticasone 50 MCG/ACT nasal spray Commonly known as:  FLONASE SHAKE LQ AND U 1 SPR IEN Q 12 H   ipratropium 0.06 % nasal spray  Commonly known as:  ATROVENT Place 2 sprays into the nose 3 (three) times daily.   isosorbide mononitrate 30 MG 24 hr tablet Commonly known as:  IMDUR Take 30 mg by mouth daily.   losartan 25 MG tablet Commonly known as:  COZAAR Take 25 mg by mouth daily.   metoprolol succinate 50 MG 24 hr tablet Commonly known as:  TOPROL-XL Take 1 tablet (50 mg total) by mouth 2 (two) times daily. Take with or immediately following a meal. What changed:  when to take this   MULTI-VITAMINS Tabs Take 1 tablet by mouth  daily.   mupirocin ointment 2 % Commonly known as:  BACTROBAN Place 1 application into the nose 2 (two) times daily.   nitroGLYCERIN 0.4 MG SL tablet Commonly known as:  NITROSTAT Place 0.4 mg under the tongue every 5 (five) minutes as needed for chest pain.   pantoprazole 40 MG tablet Commonly known as:  PROTONIX Take 40 mg by mouth daily.   sertraline 25 MG tablet Commonly known as:  ZOLOFT Take 25 mg by mouth daily.   spironolactone 25 MG tablet Commonly known as:  ALDACTONE Take 12.5 mg by mouth daily.        DISCHARGE INSTRUCTIONS:    Follow-up PMD 1 week Follow-up cardiology 1 week  If you experience worsening of your admission symptoms, develop shortness of breath, life threatening emergency, suicidal or homicidal thoughts you must seek medical attention immediately by calling 911 or calling your MD immediately  if symptoms less severe.  You Must read complete instructions/literature along with all the possible adverse reactions/side effects for all the Medicines you take and that have been prescribed to you. Take any new Medicines after you have completely understood and accept all the possible adverse reactions/side effects.   Please note  You were cared for by a hospitalist during your hospital stay. If you have any questions about your discharge medications or the care you received while you were in the hospital after you are discharged, you can call the unit and asked to speak with the hospitalist on call if the hospitalist that took care of you is not available. Once you are discharged, your primary care physician will handle any further medical issues. Please note that NO REFILLS for any discharge medications will be authorized once you are discharged, as it is imperative that you return to your primary care physician (or establish a relationship with a primary care physician if you do not have one) for your aftercare needs so that they can reassess your need for  medications and monitor your lab values.    Today   CHIEF COMPLAINT:   Chief Complaint  Patient presents with  . Shortness of Breath    HISTORY OF PRESENT ILLNESS:  Justin Mendez  is a 84 y.o. male  presented with shortness of breath   VITAL SIGNS:  Blood pressure (!) 157/81, pulse 60, temperature 97.6 F (36.4 C), temperature source Oral, resp. rate 16, height 6' (1.829 m), weight 88 kg (194 lb), SpO2 96 %.   PHYSICAL EXAMINATION:  GENERAL:  84 y.o.-year-old patient lying in the bed with no acute distress.  EYES: Pupils equal, round, reactive to light and accommodation. No scleral icterus. Extraocular muscles intact.  HEENT: Head atraumatic, normocephalic. Oropharynx and nasopharynx clear.  NECK:  Supple, no jugular venous distention. No thyroid enlargement, no tenderness.  LUNGS: Normal breath sounds bilaterally, no wheezing, rales,rhonchi or crepitation. No use of accessory muscles of respiration.  CARDIOVASCULAR: S1,  S2 normal. No murmurs, rubs, or gallops.  ABDOMEN: Soft, non-tender, non-distended. Bowel sounds present. No organomegaly or mass.  EXTREMITIES: No pedal edema, cyanosis, or clubbing.  NEUROLOGIC: Cranial nerves II through XII are intact. Muscle strength 5/5 in all extremities. Sensation intact. Gait not checked.  PSYCHIATRIC: The patient is alert and oriented x 3.  SKIN: No obvious rash, lesion, or ulcer.   DATA REVIEW:   CBC Recent Labs  Lab 12/31/17 0335  WBC 8.2  HGB 15.0  HCT 44.0  PLT 191    Chemistries  Recent Labs  Lab 12/31/17 0335  NA 141  K 4.5  CL 109  CO2 23  GLUCOSE 101*  BUN 29*  CREATININE 1.35*  CALCIUM 9.5    Cardiac Enzymes Recent Labs  Lab 12/31/17 0335  TROPONINI <0.03    Microbiology Results  Results for orders placed or performed during the hospital encounter of 12/30/17  MRSA PCR Screening     Status: None   Collection Time: 12/30/17  5:11 PM  Result Value Ref Range Status   MRSA by PCR NEGATIVE  NEGATIVE Final    Comment:        The GeneXpert MRSA Assay (FDA approved for NASAL specimens only), is one component of a comprehensive MRSA colonization surveillance program. It is not intended to diagnose MRSA infection nor to guide or monitor treatment for MRSA infections. Performed at [facility], [address] [place], [place] [address]   Surgical PCR screen     Status: Abnormal   Collection Time: 01/02/18  5:21 PM  Result Value Ref Range Status   MRSA, PCR NEGATIVE NEGATIVE Final   Staphylococcus aureus POSITIVE (A) NEGATIVE Final    Comment: (NOTE) The Xpert SA Assay (FDA approved for NASAL specimens in patients 82 years of age and older), is one component of a comprehensive surveillance program. It is not intended to diagnose infection nor to guide or monitor treatment. Performed at [facility], [address] [place], [place] [address]     RADIOLOGY:  Dg Chest Port 1 View  Result Date: 01/03/2018 CLINICAL DATA:  Pacemaker insertion. EXAM: PORTABLE CHEST 1 VIEW COMPARISON:  No recent prior. FINDINGS: Cardiac pacer with lead tips over the right atrium and right ventricle. Monitoring pads and wiring noted. Cardiomegaly with normal pulmonary vascularity. No focal infiltrate. No pleural effusion or pneumothorax. IMPRESSION: Cardiac pacer with lead tips over the right atrium right ventricle. No pulmonary venous congestion. No acute pulmonary disease. Electronically Signed   By: Maisie Fushomas  Register   On: 01/03/2018 14:21   Dg C-arm 1-60 Min-no Report  Result Date: 01/03/2018 Fluoroscopy was utilized by the requesting physician.  No radiographic interpretation.     Management plans discussed with the patient, and he is in agreement.  CODE STATUS:  Code Status History    Date Active Date Inactive Code Status Order ID Comments User Context   12/30/2017 1608 01/04/2018 1356 DNR [id number]  Ramonita LabGouru, Aruna, MD Inpatient   04/26/2017 1110 04/29/2017 1925 Full  Code [id number]  Milagros LollSudini, Srikar, MD ED    Questions for Most Recent Historical Code Status (Order [id number])    Question Answer Comment   In the event of cardiac or respiratory ARREST Do not call a "code blue"    In the event of cardiac or respiratory ARREST Do not perform Intubation, CPR, defibrillation or ACLS    In the event of cardiac or respiratory ARREST Use medication by any route, position, wound care, and other measures  to relive pain and suffering. May use oxygen, suction and manual treatment of airway obstruction as needed for comfort.    Comments rn may pronounce         Advance Directive Documentation     Most Recent Value  Type of Advance Directive  Living will, Healthcare Power of Attorney  Pre-existing out of facility DNR order (yellow form or pink MOST form)  -  "MOST" Form in Place?  -      TOTAL TIME TAKING CARE OF THIS PATIENT: 35 minutes.    Alford Highland M.D on 01/04/2018 at 3:26 PM  Between 7am to 6pm - Pager - [phone]  After 6pm go to [email or web address] - password [email or web address]  [employer] Office  [phone]  CC: Primary care physician; Danella Penton, MD

## 2018-01-04 NOTE — Care Management Note (Signed)
Case Management Note  Patient Details  Name: Justin BrownMichael College MRN: [id number] Date of Birth: 08/12/1932  Subjective/Objective:       eliquis voucher provided.             Action/Plan:   Expected Discharge Date:  01/04/18               Expected Discharge Plan:     In-House Referral:     Discharge planning Services  CM Consult, Medication Assistance  Post Acute Care Choice:    Choice offered to:     DME Arranged:    DME Agency:     HH Arranged:    HH Agency:     Status of Service:  Completed, signed off  If discussed at [employer] Length of Stay Meetings, dates discussed:    Additional Comments:  Virgel ManifoldJosh A Vaniyah Lansky, RN 01/04/2018, 9:48 AM

## 2018-01-04 NOTE — Progress Notes (Signed)
[facility]  SUBJECTIVE: Patient doing well, sitting inside the bed, eating breakfast   Vitals:   01/03/18 1944 01/03/18 2357 01/04/18 0336 01/04/18 0737  BP: 127/76 124/66 (!) 170/79 (!) 157/81  Pulse: 68 60 (!) 59 60  Resp:      Temp: 97.7 F (36.5 C)  (!) 97.5 F (36.4 C) 97.6 F (36.4 C)  TempSrc: Oral  Oral Oral  SpO2: 91% 95% 100% 96%  Weight:   88 kg (194 lb)   Height:         Intake/Output Summary (Last 24 hours) at 01/04/2018 0854 Last data filed at 01/04/2018 0836 Gross per 24 hour  Intake 403.33 ml  Output 1005 ml  Net -601.67 ml      PHYSICAL EXAM  General: Well developed, well nourished, in no acute distress HEENT:  Normocephalic and atramatic Neck:  No JVD.  Lungs: Clear bilaterally to auscultation and percussion. Heart: HRRR . Normal S1 and S2 without gallops or murmurs.  Abdomen: Bowel sounds are positive, abdomen soft and non-tender  Msk:  Back normal, normal gait. Normal strength and tone for age. Extremities: No clubbing, cyanosis or edema.   Neuro: Alert and oriented X 3. Psych:  Good affect, responds appropriately   LABS: Basic Metabolic Panel: No results for input(s): NA, K, CL, CO2, GLUCOSE, BUN, CREATININE, CALCIUM, MG, PHOS in the last 72 hours. Liver Function Tests: No results for input(s): AST, ALT, ALKPHOS, BILITOT, PROT, ALBUMIN in the last 72 hours. No results for input(s): LIPASE, AMYLASE in the last 72 hours. CBC: No results for input(s): WBC, NEUTROABS, HGB, HCT, MCV, PLT in the last 72 hours. Cardiac Enzymes: No results for input(s): CKTOTAL, CKMB, CKMBINDEX, TROPONINI in the last 72 hours. BNP: Invalid input(s): POCBNP D-Dimer: No results for input(s): DDIMER in the last 72 hours. Hemoglobin A1C: No results for input(s): HGBA1C in the last 72 hours. Fasting Lipid Panel: No results for input(s): CHOL, HDL, LDLCALC, TRIG, CHOLHDL, LDLDIRECT in the last 72 hours. Thyroid Function Tests: No results for input(s): TSH,  T4TOTAL, T3FREE, THYROIDAB in the last 72 hours.  Invalid input(s): FREET3 Anemia Panel: No results for input(s): VITAMINB12, FOLATE, FERRITIN, TIBC, IRON, RETICCTPCT in the last 72 hours.  Dg Chest Port 1 View  Result Date: 01/03/2018 CLINICAL DATA:  Pacemaker insertion. EXAM: PORTABLE CHEST 1 VIEW COMPARISON:  No recent prior. FINDINGS: Cardiac pacer with lead tips over the right atrium and right ventricle. Monitoring pads and wiring noted. Cardiomegaly with normal pulmonary vascularity. No focal infiltrate. No pleural effusion or pneumothorax. IMPRESSION: Cardiac pacer with lead tips over the right atrium right ventricle. No pulmonary venous congestion. No acute pulmonary disease. Electronically Signed   By: Maisie Fushomas  Register   On: 01/03/2018 14:21   Dg C-arm 1-60 Min-no Report  Result Date: 01/03/2018 Fluoroscopy was utilized by the requesting physician.  No radiographic interpretation.     Echo   TELEMETRY: Atrial sensing with ventricular pacing:  ASSESSMENT AND PLAN:  Active Problems:   Heart block AV second degree    1.  Type II second-degree AV block, status post dual-chamber pacemaker  Recommendations  1.  Agree with current therapy 2.  May discharge home 3.  Keflex 500 mg twice daily for 7 days 4.  Follow-up as outpatient in 1 week   Justin MillardAlexander Nisaiah Bechtol, MD, PhD, [facility] 01/04/2018 8:54 AM

## 2018-01-06 ENCOUNTER — Encounter: Payer: Self-pay | Admitting: Cardiology

## 2018-03-27 ENCOUNTER — Encounter (INDEPENDENT_AMBULATORY_CARE_PROVIDER_SITE_OTHER): Payer: Self-pay | Admitting: Vascular Surgery

## 2018-03-27 ENCOUNTER — Ambulatory Visit (INDEPENDENT_AMBULATORY_CARE_PROVIDER_SITE_OTHER): Payer: Medicare Other

## 2018-03-27 ENCOUNTER — Other Ambulatory Visit (INDEPENDENT_AMBULATORY_CARE_PROVIDER_SITE_OTHER): Payer: Self-pay | Admitting: Vascular Surgery

## 2018-03-27 ENCOUNTER — Ambulatory Visit (INDEPENDENT_AMBULATORY_CARE_PROVIDER_SITE_OTHER): Payer: Medicare Other | Admitting: Vascular Surgery

## 2018-03-27 VITALS — BP 123/66 | HR 60 | Resp 16 | Ht 70.0 in | Wt 210.0 lb

## 2018-03-27 DIAGNOSIS — I10 Essential (primary) hypertension: Secondary | ICD-10-CM | POA: Diagnosis not present

## 2018-03-27 DIAGNOSIS — I25118 Atherosclerotic heart disease of native coronary artery with other forms of angina pectoris: Secondary | ICD-10-CM

## 2018-03-27 DIAGNOSIS — I739 Peripheral vascular disease, unspecified: Secondary | ICD-10-CM

## 2018-03-27 DIAGNOSIS — I6523 Occlusion and stenosis of bilateral carotid arteries: Secondary | ICD-10-CM | POA: Diagnosis not present

## 2018-03-27 DIAGNOSIS — E782 Mixed hyperlipidemia: Secondary | ICD-10-CM

## 2018-03-29 ENCOUNTER — Encounter (INDEPENDENT_AMBULATORY_CARE_PROVIDER_SITE_OTHER): Payer: Self-pay | Admitting: Vascular Surgery

## 2018-03-29 NOTE — Progress Notes (Signed)
MRN : [id number]  Justin Mendez is a 84 y.o. (09-19-1932) male who presents with chief complaint of  Chief Complaint  Patient presents with  . Follow-up    6 month ABI follow up  .  History of Present Illness: The patient returns to the office for followup and review of the noninvasive studies. There has been a significant deterioration in the lower extremity symptoms.  The patient notes interval shortening of their claudication distance and development of mild rest pain symptoms. No new ulcers or wounds have occurred since the last visit.  There has been significant change to the patient's overall health care.  Since last visit he has had a large MI and now has an EF of 30%.  The patient denies amaurosis fugax or recent TIA symptoms. There are no recent neurological changes noted. The patient denies history of DVT, PE or superficial thrombophlebitis. The patient denies recent episodes of angina or shortness of breath.   ABI's Rt=1.58([place]) and Lt=1.29([place]) (previous ABI's Rt=0.88 and Lt=1.02)   Current Meds  Medication Sig  . ALPRAZolam (XANAX) 0.5 MG tablet Take 0.5 mg by mouth at bedtime.  Marland Kitchen atorvastatin (LIPITOR) 80 MG tablet Take 80 mg by mouth daily.   . clopidogrel (PLAVIX) 75 MG tablet Take 75 mg by mouth daily.   . clotrimazole-betamethasone (LOTRISONE) cream Apply 2 application topically as needed.  . fluticasone (FLONASE) 50 MCG/ACT nasal spray SHAKE LQ AND U 1 SPR IEN Q 12 H  . ipratropium (ATROVENT) 0.06 % nasal spray Place 2 sprays into the nose 3 (three) times daily.   . isosorbide mononitrate (IMDUR) 30 MG 24 hr tablet Take 30 mg by mouth daily.   . metoprolol succinate (TOPROL-XL) 50 MG 24 hr tablet Take 1 tablet (50 mg total) by mouth 2 (two) times daily. Take with or immediately following a meal.  . Multiple Vitamin (MULTI-VITAMINS) TABS Take 1 tablet by mouth daily.   . mupirocin ointment (BACTROBAN) 2 % Place 1 application into the nose 2 (two) times daily.  .  nitroGLYCERIN (NITROSTAT) 0.4 MG SL tablet Place 0.4 mg under the tongue every 5 (five) minutes as needed for chest pain.  Marland Kitchen olmesartan (BENICAR) 20 MG tablet Take by mouth.  . pantoprazole (PROTONIX) 40 MG tablet Take 40 mg by mouth daily.  . sertraline (ZOLOFT) 25 MG tablet Take 25 mg by mouth daily.   Marland Kitchen spironolactone (ALDACTONE) 25 MG tablet Take 12.5 mg by mouth daily.   . TraMADol HCl 100 MG TB24 Take by mouth.    Past Medical History:  Diagnosis Date  . H/O carotid endarterectomy   . Hearing loss   . High cholesterol   . Hypertension   . Neuropathy   . PAD (peripheral artery disease) (HCC)     Past Surgical History:  Procedure Laterality Date  . carotid endarderectomy     Bilateral  . KNEE SURGERY     Left  . NECK SURGERY    . PACEMAKER INSERTION Left 01/03/2018   Procedure: INSERTION PACEMAKER-INITIAL DUAL CHAMBER;  Surgeon: Marcina Millard, MD;  Location: [facility] ORS;  Service: Cardiovascular;  Laterality: Left;  . RIGHT/LEFT HEART CATH AND CORONARY ANGIOGRAPHY N/A 04/29/2017   Procedure: RIGHT/LEFT HEART CATH AND CORONARY ANGIOGRAPHY;  Surgeon: Marcina Millard, MD;  Location: [facility] INVASIVE CV LAB;  Service: Cardiovascular;  Laterality: N/A;    Social History Social History   Tobacco Use  . Smoking status: Former Smoker    Types: Cigarettes    Last attempt  to quit: 08/31/1984    Years since quitting: 33.5  . Smokeless tobacco: Never Used  Substance Use Topics  . Alcohol use: No  . Drug use: No    Family History Family History  Problem Relation Age of Onset  . Lung cancer Father     Allergies  Allergen Reactions  . Percocet [Oxycodone-Acetaminophen] Other (See Comments)    Confusion/hallucinations  . Iodinated Diagnostic Agents Other (See Comments)     Was very "flushed", denies itching, swelling or hives. He states this was over 30 years ago with Ionic contrast and not the current non-ionic contrast. SPM     REVIEW OF SYSTEMS (Negative unless  checked)  Constitutional: [] Weight loss  [] Fever  [] Chills Cardiac: [x] Chest pain   [] Chest pressure   [] Palpitations   [] Shortness of breath when laying flat   [] Shortness of breath with exertion. Vascular:  [x] Pain in legs with walking   [] Pain in legs at rest  [] History of DVT   [] Phlebitis   [] Swelling in legs   [] Varicose veins   [] Non-healing ulcers Pulmonary:   [] Uses home oxygen   [] Productive cough   [] Hemoptysis   [] Wheeze  [] COPD   [] Asthma Neurologic:  [] Dizziness   [] Seizures   [] History of stroke   [] History of TIA  [] Aphasia   [] Vissual changes   [] Weakness or numbness in arm   [] Weakness or numbness in leg Musculoskeletal:   [] Joint swelling   [] Joint pain   [] Low back pain Hematologic:  [] Easy bruising  [] Easy bleeding   [] Hypercoagulable state   [] Anemic Gastrointestinal:  [] Diarrhea   [] Vomiting  [] Gastroesophageal reflux/heartburn   [] Difficulty swallowing. Genitourinary:  [] Chronic kidney disease   [] Difficult urination  [] Frequent urination   [] Blood in urine Skin:  [] Rashes   [] Ulcers  Psychological:  [] History of anxiety   []  History of major depression.  Physical Examination  Vitals:   03/27/18 1423  BP: 123/66  Pulse: 60  Resp: 16  Weight: 210 lb (95.3 kg)  Height: 5\' 10"  (1.778 m)   Body mass index is 30.13 kg/m. Gen: WD/WN, NAD Head: [place]/AT, No temporalis wasting.  Ear/Nose/Throat: Hearing grossly intact, nares w/o erythema or drainage Eyes: PER, EOMI, sclera nonicteric.  Neck: Supple, no large masses.   Pulmonary:  Good air movement, no audible wheezing bilaterally, no use of accessory muscles.  Cardiac: RRR, no JVD Vascular:  Bilateral carotid bruit Vessel Right Left  Radial Palpable Palpable  Brachial Palpable Palpable  Carotid Palpable Palpable  Popliteal Not Palpable Not Palpable  PT Not Palpable Not Palpable  DP Not Palpable Not Palpable  Gastrointestinal: Non-distended. No guarding/no peritoneal signs.  Musculoskeletal: M/S 5/5 throughout.   No deformity or atrophy.  Neurologic: CN 2-12 intact. Symmetrical.  Speech is fluent. Motor exam as listed above. Psychiatric: Judgment intact, Mood & affect appropriate for pt's clinical situation. Dermatologic: No rashes or ulcers noted.  No changes consistent with cellulitis. Lymph : No lichenification or skin changes of chronic lymphedema.  CBC Lab Results  Component Value Date   WBC 8.2 12/31/2017   HGB 15.0 12/31/2017   HCT 44.0 12/31/2017   MCV 92.3 12/31/2017   PLT 191 12/31/2017    BMET    Component Value Date/Time   NA 141 12/31/2017 0335   K 4.5 12/31/2017 0335   CL 109 12/31/2017 0335   CO2 23 12/31/2017 0335   GLUCOSE 101 (H) 12/31/2017 0335   BUN 29 (H) 12/31/2017 0335   CREATININE 1.35 (H) 12/31/2017 0335   CALCIUM  9.5 12/31/2017 0335   GFRNONAA 47 (L) 12/31/2017 0335   GFRAA 54 (L) 12/31/2017 0335   CrCl cannot be calculated (Patient's most recent lab result is older than the maximum 21 days allowed.).  COAG Lab Results  Component Value Date   INR 1.17 04/26/2017   INR 1.17 09/01/2011    Radiology No results found.   Assessment/Plan 1. Peripheral arterial disease (HCC) Recommend:  Patient should undergo arterial duplex of the lower extremity ASAP because there has been a significant deterioration in the patient's lower extremity symptoms.  The patient states they are having increased pain and a marked decrease in the distance that they can walk.  The risks and benefits as well as the alternatives were discussed in detail with the patient.  All questions were answered.  Patient agrees to proceed and understands this could be a prelude to angiography and intervention.  The patient will follow up with me in the office to review the studies.  - VAS [place] LOWER EXTREMITY ARTERIAL DUPLEX; Future - VAS US AORTA/IVC/ILIACS; Future  2. Bilateral carotid artery stenosis Recommend:  Given the patient's asymptomatic subcritical stenosis no further invasive  testing or surgery at this time.  Continue antiplatelet therapy as prescribed Continue management of CAD, HTN and Hyperlipidemia Healthy heart diet,  encouraged exercise at least 4 times per week  Follow up in 12 months with duplex ultrasound and physical exam   3. Atherosclerosis of native coronary artery of native heart with stable angina pectoris (HCC) Continue cardiac and antihypertensive medications as already ordered and reviewed, no changes at this time.  Continue statin as ordered and reviewed, no changes at this time  Nitrates PRN for chest pain   4. Essential hypertension Continue antihypertensive medications as already ordered, these medications have been reviewed and there are no changes at this time.   5. Combined hyperlipidemia Continue statin as ordered and reviewed, no changes at this time     Levora Dredge, MD  03/29/2018 12:11 PM

## 2018-04-24 ENCOUNTER — Encounter (INDEPENDENT_AMBULATORY_CARE_PROVIDER_SITE_OTHER): Payer: Self-pay | Admitting: Vascular Surgery

## 2018-04-24 ENCOUNTER — Ambulatory Visit (INDEPENDENT_AMBULATORY_CARE_PROVIDER_SITE_OTHER): Payer: Medicare Other

## 2018-04-24 ENCOUNTER — Ambulatory Visit (INDEPENDENT_AMBULATORY_CARE_PROVIDER_SITE_OTHER): Payer: Medicare Other | Admitting: Vascular Surgery

## 2018-04-24 VITALS — BP 169/77 | HR 78 | Resp 16 | Ht 70.0 in | Wt 213.4 lb

## 2018-04-24 DIAGNOSIS — I739 Peripheral vascular disease, unspecified: Secondary | ICD-10-CM | POA: Diagnosis not present

## 2018-04-24 DIAGNOSIS — I70223 Atherosclerosis of native arteries of extremities with rest pain, bilateral legs: Secondary | ICD-10-CM

## 2018-04-24 DIAGNOSIS — Z87891 Personal history of nicotine dependence: Secondary | ICD-10-CM

## 2018-04-24 DIAGNOSIS — I10 Essential (primary) hypertension: Secondary | ICD-10-CM | POA: Diagnosis not present

## 2018-04-24 DIAGNOSIS — I4891 Unspecified atrial fibrillation: Secondary | ICD-10-CM

## 2018-04-24 DIAGNOSIS — I70229 Atherosclerosis of native arteries of extremities with rest pain, unspecified extremity: Secondary | ICD-10-CM | POA: Insufficient documentation

## 2018-04-24 DIAGNOSIS — I25118 Atherosclerotic heart disease of native coronary artery with other forms of angina pectoris: Secondary | ICD-10-CM

## 2018-04-24 DIAGNOSIS — I6523 Occlusion and stenosis of bilateral carotid arteries: Secondary | ICD-10-CM | POA: Diagnosis not present

## 2018-04-24 NOTE — Progress Notes (Signed)
MRN : [id number]  Justin Mendez is a 84 y.o. (09-03-1932) male who presents with chief complaint of  Chief Complaint  Patient presents with  . Follow-up    ultrasound follow up  .  History of Present Illness:   The patient returns to the office for followup and review of the noninvasive studies. There has been a significant deterioration in the lower extremity symptoms.  The patient notes interval shortening of their claudication distance and development of mild rest pain symptoms. No new ulcers or wounds have occurred since the last visit.  There have been no significant changes to the patient's overall health care.  The patient denies amaurosis fugax or recent TIA symptoms. There are no recent neurological changes noted. The patient denies history of DVT, PE or superficial thrombophlebitis. The patient denies recent episodes of angina or shortness of breath.   Duplex US of the lower extremity arterial system shows the infrarenal abdominal aorta measures 2.5 cm in maximal dimension.  Significant stenosis is noted in the SFA bilaterally although both stents remain patent.  There is a greater than 70% stenosis identified.  Also of note there is monophasic signals in the common femoral arteries bilaterally suggesting iliac disease  Current Meds  Medication Sig  . ALPRAZolam (XANAX) 0.5 MG tablet Take 0.5 mg by mouth at bedtime.  Marland Kitchen atorvastatin (LIPITOR) 80 MG tablet Take 80 mg by mouth daily.   . clopidogrel (PLAVIX) 75 MG tablet Take 75 mg by mouth daily.   . clotrimazole-betamethasone (LOTRISONE) cream Apply 2 application topically as needed.  Marland Kitchen escitalopram (LEXAPRO) 10 MG tablet Take by mouth.  . fluticasone (FLONASE) 50 MCG/ACT nasal spray SHAKE LQ AND U 1 SPR IEN Q 12 H  . ipratropium (ATROVENT) 0.06 % nasal spray Place 2 sprays into the nose 3 (three) times daily.   . isosorbide mononitrate (IMDUR) 30 MG 24 hr tablet Take 30 mg by mouth daily.   Marland Kitchen losartan (COZAAR) 25 MG  tablet Take 25 mg by mouth daily.  . metoprolol succinate (TOPROL-XL) 50 MG 24 hr tablet Take 1 tablet (50 mg total) by mouth 2 (two) times daily. Take with or immediately following a meal.  . Multiple Vitamin (MULTI-VITAMINS) TABS Take 1 tablet by mouth daily.   . mupirocin ointment (BACTROBAN) 2 % Place 1 application into the nose 2 (two) times daily.  . nitroGLYCERIN (NITROSTAT) 0.4 MG SL tablet Place 0.4 mg under the tongue every 5 (five) minutes as needed for chest pain.  Marland Kitchen olmesartan (BENICAR) 20 MG tablet Take by mouth.  . pantoprazole (PROTONIX) 40 MG tablet Take 40 mg by mouth daily.  . sertraline (ZOLOFT) 25 MG tablet Take 25 mg by mouth daily.   Marland Kitchen spironolactone (ALDACTONE) 25 MG tablet Take 12.5 mg by mouth daily.   . TraMADol HCl 100 MG TB24 Take by mouth.    Past Medical History:  Diagnosis Date  . H/O carotid endarterectomy   . Hearing loss   . High cholesterol   . Hypertension   . Neuropathy   . PAD (peripheral artery disease) (HCC)     Past Surgical History:  Procedure Laterality Date  . carotid endarderectomy     Bilateral  . KNEE SURGERY     Left  . NECK SURGERY    . PACEMAKER INSERTION Left 01/03/2018   Procedure: INSERTION PACEMAKER-INITIAL DUAL CHAMBER;  Surgeon: Marcina Millard, MD;  Location: [facility] ORS;  Service: Cardiovascular;  Laterality: Left;  . RIGHT/LEFT HEART CATH AND CORONARY  ANGIOGRAPHY N/A 04/29/2017   Procedure: RIGHT/LEFT HEART CATH AND CORONARY ANGIOGRAPHY;  Surgeon: Marcina Millard, MD;  Location: [facility] INVASIVE CV LAB;  Service: Cardiovascular;  Laterality: N/A;    Social History Social History   Tobacco Use  . Smoking status: Former Smoker    Types: Cigarettes    Last attempt to quit: 08/31/1984    Years since quitting: 33.6  . Smokeless tobacco: Never Used  Substance Use Topics  . Alcohol use: No  . Drug use: No    Family History Family History  Problem Relation Age of Onset  . Lung cancer Father     Allergies    Allergen Reactions  . Percocet [Oxycodone-Acetaminophen] Other (See Comments)    Confusion/hallucinations  . Iodinated Diagnostic Agents Other (See Comments)     Was very "flushed", denies itching, swelling or hives. He states this was over 30 years ago with Ionic contrast and not the current non-ionic contrast. SPM     REVIEW OF SYSTEMS (Negative unless checked)  Constitutional: [] Weight loss  [] Fever  [] Chills Cardiac: [] Chest pain   [] Chest pressure   [] Palpitations   [] Shortness of breath when laying flat   [] Shortness of breath with exertion. Vascular:  [x] Pain in legs with walking   [x] Pain in legs at rest  [] History of DVT   [] Phlebitis   [] Swelling in legs   [] Varicose veins   [] Non-healing ulcers Pulmonary:   [] Uses home oxygen   [] Productive cough   [] Hemoptysis   [] Wheeze  [x] COPD   [] Asthma Neurologic:  [] Dizziness   [] Seizures   [] History of stroke   [] History of TIA  [] Aphasia   [] Vissual changes   [] Weakness or numbness in arm   [] Weakness or numbness in leg Musculoskeletal:   [] Joint swelling   [] Joint pain   [] Low back pain Hematologic:  [] Easy bruising  [] Easy bleeding   [] Hypercoagulable state   [] Anemic Gastrointestinal:  [] Diarrhea   [] Vomiting  [x] Gastroesophageal reflux/heartburn   [] Difficulty swallowing. Genitourinary:  [] Chronic kidney disease   [] Difficult urination  [] Frequent urination   [] Blood in urine Skin:  [] Rashes   [] Ulcers  Psychological:  [] History of anxiety   []  History of major depression.  Physical Examination  Vitals:   04/24/18 1157  BP: (!) 169/77  Pulse: 78  Resp: 16  Weight: 213 lb 6.4 oz (96.8 kg)  Height: 5\' 10"  (1.778 m)   Body mass index is 30.62 kg/m. Gen: WD/WN, NAD Head: [place]/AT, No temporalis wasting.  Ear/Nose/Throat: Hearing grossly intact, nares w/o erythema or drainage Eyes: PER, EOMI, sclera nonicteric.  Neck: Supple, no large masses.   Pulmonary:  Good air movement, no audible wheezing bilaterally, no use of accessory  muscles.  Cardiac: RRR, no JVD Vascular:  Vessel Right Left  Radial Palpable Palpable  Popliteal Not Palpable Not Palpable  PT Not Palpable Not Palpable  DP Not Palpable Not Palpable  Gastrointestinal: Non-distended. No guarding/no peritoneal signs.  Musculoskeletal: M/S 5/5 throughout.  No deformity or atrophy.  Neurologic: CN 2-12 intact. Symmetrical.  Speech is fluent. Motor exam as listed above. Psychiatric: Judgment intact, Mood & affect appropriate for pt's clinical situation. Dermatologic: No rashes or ulcers noted.  No changes consistent with cellulitis. Lymph : No lichenification or skin changes of chronic lymphedema.  CBC Lab Results  Component Value Date   WBC 8.2 12/31/2017   HGB 15.0 12/31/2017   HCT 44.0 12/31/2017   MCV 92.3 12/31/2017   PLT 191 12/31/2017    BMET    Component Value  Date/Time   NA 141 12/31/2017 0335   K 4.5 12/31/2017 0335   CL 109 12/31/2017 0335   CO2 23 12/31/2017 0335   GLUCOSE 101 (H) 12/31/2017 0335   BUN 29 (H) 12/31/2017 0335   CREATININE 1.35 (H) 12/31/2017 0335   CALCIUM 9.5 12/31/2017 0335   GFRNONAA 47 (L) 12/31/2017 0335   GFRAA 54 (L) 12/31/2017 0335   CrCl cannot be calculated (Patient's most recent lab result is older than the maximum 21 days allowed.).  COAG Lab Results  Component Value Date   INR 1.17 04/26/2017   INR 1.17 09/01/2011    Radiology No results found.   Assessment/Plan 1. Atherosclerosis of native artery of both lower extremities with rest pain (HCC) Recommend:  The patient has evidence of severe atherosclerotic changes of both lower extremities with rest pain that is associated with preulcerative changes and impending tissue loss of the foot bilaterally.  He has a history of bilateral SFA interventions with stent placement and given the stenoses identified on ultrasound stent patency is now threatened.  This represents a limb threatening ischemia and places the patient at the risk for limb loss.   He states the right leg is significantly worse than left.  The right leg will be treated first.  The possibility given monophasic signals in the common femorals bilaterally of kissing stents or bilateral iliac interventions was also discussed.  If possible and minimal contrast has been utilized the left side would also be addressed at the initial angiography although I stated this was not an absolute.  Patient should undergo angiography of the right lower extremity with the hope for intervention for limb salvage.  The risks and benefits as well as the alternative therapies was discussed in detail with the patient.  All questions were answered.  Patient agrees to proceed with angiography.  The patient will follow up with me in the office after the procedure.     A total of 30 minutes was spent with this patient and greater than 50% was spent in counseling and coordination of care with the patient.  Discussion included the treatment options for vascular disease including indications for surgery and intervention.  Also discussed is the appropriate timing of treatment.  In addition medical therapy was discussed.   2. Bilateral carotid artery stenosis Recommend:  Given the patient's asymptomatic subcritical stenosis no further invasive testing or surgery at this time.  Continue antiplatelet therapy as prescribed Continue management of CAD, HTN and Hyperlipidemia Healthy heart diet,  encouraged exercise at least 4 times per week Follow up in as already arranged with duplex ultrasound and physical exam   3. Atherosclerosis of native coronary artery of native heart with stable angina pectoris (HCC) Continue cardiac and antihypertensive medications as already ordered and reviewed, no changes at this time.  Continue statin as ordered and reviewed, no changes at this time  Nitrates PRN for chest pain   4. Essential hypertension Continue antihypertensive medications as already ordered, these  medications have been reviewed and there are no changes at this time.   5. Atrial fibrillation with RVR (HCC) Continue antiarrhythmia medications as already ordered, these medications have been reviewed and there are no changes at this time.  Continue anticoagulation as ordered by Cardiology Service  Levora Dredge, MD  04/24/2018 2:06 PM

## 2018-04-25 ENCOUNTER — Encounter (INDEPENDENT_AMBULATORY_CARE_PROVIDER_SITE_OTHER): Payer: Self-pay | Admitting: Vascular Surgery

## 2018-04-25 ENCOUNTER — Encounter (INDEPENDENT_AMBULATORY_CARE_PROVIDER_SITE_OTHER): Payer: Self-pay

## 2018-04-25 ENCOUNTER — Other Ambulatory Visit (INDEPENDENT_AMBULATORY_CARE_PROVIDER_SITE_OTHER): Payer: Self-pay | Admitting: Nurse Practitioner

## 2018-04-28 MED ORDER — DEXTROSE 5 % IV SOLN
2.0000 g | Freq: Once | INTRAVENOUS | Status: DC
  Filled 2018-04-28: qty 20

## 2018-04-29 ENCOUNTER — Ambulatory Visit
Admission: RE | Admit: 2018-04-29 | Discharge: 2018-04-29 | Disposition: A | Discharge status: Home / Self Care | Payer: Medicare Other | Source: Ambulatory Visit | Attending: Vascular Surgery | Admitting: Vascular Surgery

## 2018-04-29 ENCOUNTER — Encounter
Admission: RE | Disposition: A | Discharge status: Home / Self Care | Payer: Self-pay | Source: Ambulatory Visit | Attending: Vascular Surgery

## 2018-04-29 DIAGNOSIS — Z885 Allergy status to narcotic agent status: Secondary | ICD-10-CM | POA: Diagnosis not present

## 2018-04-29 DIAGNOSIS — G629 Polyneuropathy, unspecified: Secondary | ICD-10-CM | POA: Diagnosis not present

## 2018-04-29 DIAGNOSIS — I70223 Atherosclerosis of native arteries of extremities with rest pain, bilateral legs: Secondary | ICD-10-CM | POA: Diagnosis not present

## 2018-04-29 DIAGNOSIS — Z87891 Personal history of nicotine dependence: Secondary | ICD-10-CM | POA: Insufficient documentation

## 2018-04-29 DIAGNOSIS — Z955 Presence of coronary angioplasty implant and graft: Secondary | ICD-10-CM | POA: Diagnosis not present

## 2018-04-29 DIAGNOSIS — H9190 Unspecified hearing loss, unspecified ear: Secondary | ICD-10-CM | POA: Insufficient documentation

## 2018-04-29 DIAGNOSIS — E7800 Pure hypercholesterolemia, unspecified: Secondary | ICD-10-CM | POA: Diagnosis not present

## 2018-04-29 DIAGNOSIS — Z91041 Radiographic dye allergy status: Secondary | ICD-10-CM | POA: Diagnosis not present

## 2018-04-29 DIAGNOSIS — I739 Peripheral vascular disease, unspecified: Secondary | ICD-10-CM

## 2018-04-29 DIAGNOSIS — I4891 Unspecified atrial fibrillation: Secondary | ICD-10-CM | POA: Diagnosis not present

## 2018-04-29 DIAGNOSIS — I743 Embolism and thrombosis of arteries of the lower extremities: Secondary | ICD-10-CM | POA: Insufficient documentation

## 2018-04-29 DIAGNOSIS — I10 Essential (primary) hypertension: Secondary | ICD-10-CM | POA: Insufficient documentation

## 2018-04-29 DIAGNOSIS — I6523 Occlusion and stenosis of bilateral carotid arteries: Secondary | ICD-10-CM | POA: Insufficient documentation

## 2018-04-29 DIAGNOSIS — Z9889 Other specified postprocedural states: Secondary | ICD-10-CM | POA: Insufficient documentation

## 2018-04-29 DIAGNOSIS — Z950 Presence of cardiac pacemaker: Secondary | ICD-10-CM | POA: Insufficient documentation

## 2018-04-29 HISTORY — PX: LOWER EXTREMITY ANGIOGRAPHY: CATH118251

## 2018-04-29 LAB — BUN: BUN: 19 mg/dL (ref 8–23)

## 2018-04-29 LAB — CREATININE, SERUM
Creatinine, Ser: 1.23 mg/dL (ref 0.61–1.24)
GFR calc Af Amer: >60 mL/min (ref >60)
GFR calc non Af Amer: 52 mL/min — ABNORMAL LOW (ref >60)

## 2018-04-29 SURGERY — LOWER EXTREMITY ANGIOGRAPHY
Anesthesia: Moderate Sedation | Site: Leg Lower | Laterality: Right

## 2018-04-29 MED ORDER — SODIUM CHLORIDE 0.9 % IV SOLN: 250.0000 mL | INTRAVENOUS | Status: DC | PRN

## 2018-04-29 MED ORDER — ONDANSETRON HCL 4 MG/2ML IJ SOLN
4.0000 mg | Freq: Four times a day (QID) | INTRAMUSCULAR | Status: DC | PRN
Start: 2018-04-29 — End: 2018-04-29

## 2018-04-29 MED ORDER — SODIUM CHLORIDE 0.9 % IV SOLN: INTRAVENOUS | Status: DC

## 2018-04-29 MED ORDER — SODIUM CHLORIDE 0.9% FLUSH: 3.0000 mL | Freq: Two times a day (BID) | INTRAVENOUS | Status: DC

## 2018-04-29 MED ORDER — FENTANYL CITRATE (PF) 100 MCG/2ML IJ SOLN
INTRAMUSCULAR | Status: AC
  Filled 2018-04-29: qty 2

## 2018-04-29 MED ORDER — SODIUM CHLORIDE 0.9% FLUSH: 3.0000 mL | INTRAVENOUS | Status: DC | PRN

## 2018-04-29 MED ORDER — IOPAMIDOL (ISOVUE-300) INJECTION 61%
INTRAVENOUS | Status: DC | PRN
  Administered 2018-04-29: 60 mL via INTRA_ARTERIAL

## 2018-04-29 MED ORDER — LIDOCAINE HCL (PF) 1 % IJ SOLN
INTRAMUSCULAR | Status: AC
  Filled 2018-04-29: qty 30

## 2018-04-29 MED ORDER — MIDAZOLAM HCL 2 MG/2ML IJ SOLN
INTRAMUSCULAR | Status: DC | PRN
  Administered 2018-04-29: 1 mg via INTRAVENOUS
  Administered 2018-04-29: 2 mg via INTRAVENOUS

## 2018-04-29 MED ORDER — HYDROMORPHONE HCL 1 MG/ML IJ SOLN: 1.0000 mg | Freq: Once | INTRAMUSCULAR | Status: DC | PRN

## 2018-04-29 MED ORDER — OXYCODONE HCL 5 MG PO TABS: 5.0000 mg | ORAL_TABLET | ORAL | Status: DC | PRN

## 2018-04-29 MED ORDER — CEFAZOLIN SODIUM-DEXTROSE 2-4 GM/100ML-% IV SOLN
INTRAVENOUS | Status: AC
  Administered 2018-04-29: 11:00:00
  Filled 2018-04-29: qty 100

## 2018-04-29 MED ORDER — SODIUM CHLORIDE 0.9 % IV SOLN
INTRAVENOUS | Status: DC
  Administered 2018-04-29: 1000 mL via INTRAVENOUS

## 2018-04-29 MED ORDER — DIPHENHYDRAMINE HCL 50 MG/ML IJ SOLN: 50.0000 mg | Freq: Once | INTRAMUSCULAR | Status: DC

## 2018-04-29 MED ORDER — ACETAMINOPHEN 325 MG PO TABS: 650.0000 mg | ORAL_TABLET | ORAL | Status: DC | PRN

## 2018-04-29 MED ORDER — HEPARIN (PORCINE) IN NACL 1000-0.9 UT/500ML-% IV SOLN
INTRAVENOUS | Status: AC
  Filled 2018-04-29: qty 1000

## 2018-04-29 MED ORDER — HEPARIN SODIUM (PORCINE) 1000 UNIT/ML IJ SOLN
INTRAMUSCULAR | Status: AC
  Filled 2018-04-29: qty 1

## 2018-04-29 MED ORDER — FAMOTIDINE 20 MG PO TABS: 40.0000 mg | ORAL_TABLET | Freq: Once | ORAL | Status: DC

## 2018-04-29 MED ORDER — MIDAZOLAM HCL 5 MG/5ML IJ SOLN
INTRAMUSCULAR | Status: AC
  Filled 2018-04-29: qty 5

## 2018-04-29 MED ORDER — ONDANSETRON HCL 4 MG/2ML IJ SOLN: 4.0000 mg | Freq: Four times a day (QID) | INTRAMUSCULAR | Status: DC | PRN

## 2018-04-29 MED ORDER — SODIUM CHLORIDE 0.9 % IV BOLUS
250.0000 mL | Freq: Once | INTRAVENOUS | Status: AC
  Administered 2018-04-29: 250 mL via INTRAVENOUS

## 2018-04-29 MED ORDER — HYDRALAZINE HCL 20 MG/ML IJ SOLN: 5.0000 mg | INTRAMUSCULAR | Status: DC | PRN

## 2018-04-29 MED ORDER — METHYLPREDNISOLONE SODIUM SUCC 125 MG IJ SOLR: 125.0000 mg | Freq: Once | INTRAMUSCULAR | Status: DC

## 2018-04-29 MED ORDER — DIPHENHYDRAMINE HCL 25 MG PO CAPS: 50.0000 mg | ORAL_CAPSULE | Freq: Once | ORAL | Status: DC

## 2018-04-29 MED ORDER — FENTANYL CITRATE (PF) 100 MCG/2ML IJ SOLN
INTRAMUSCULAR | Status: DC | PRN
  Administered 2018-04-29: 25 ug via INTRAVENOUS
  Administered 2018-04-29: 50 ug via INTRAVENOUS

## 2018-04-29 MED ORDER — LABETALOL HCL 5 MG/ML IV SOLN: 10.0000 mg | INTRAVENOUS | Status: DC | PRN

## 2018-04-29 MED ORDER — MORPHINE SULFATE (PF) 4 MG/ML IV SOLN: 2.0000 mg | INTRAVENOUS | Status: DC | PRN

## 2018-04-29 SURGICAL SUPPLY — 9 items
CANNULA 5F STIFF (CANNULA) ×2 IMPLANT
CATH PIG 70CM (CATHETERS) ×2 IMPLANT
COVER PROBE U/S 5X48 (MISCELLANEOUS) ×2 IMPLANT
PACK ANGIOGRAPHY (CUSTOM PROCEDURE TRAY) ×3 IMPLANT
SET INTRO CAPELLA COAXIAL (SET/KITS/TRAYS/PACK) ×2 IMPLANT
SHEATH BRITE TIP 5FRX11 (SHEATH) ×4 IMPLANT
SHIELD RADPAD SCOOP 12X17 (MISCELLANEOUS) ×2 IMPLANT
TUBING CONTRAST HIGH PRESS 72 (TUBING) ×2 IMPLANT
WIRE J 3MM .035X145CM (WIRE) ×2 IMPLANT

## 2018-04-29 NOTE — H&P (Signed)
[facility] History & Physical Update  The patient was interviewed and re-examined.  The patient's previous History and Physical has been reviewed and is unchanged.  There is no change in the plan of care. We plan to proceed with the scheduled procedure.  Levora Dredge, MD  04/29/2018, 10:53 AM

## 2018-04-29 NOTE — Op Note (Signed)
[facility]  Percutaneous Study/Intervention Procedural Note   Date of Surgery: 04/29/2018,12:03 PM  Surgeon:Emma Schupp, Latina Craver   Pre-operative Diagnosis: Atherosclerotic occlusive disease bilateral lower extremities with mild rest pain  Post-operative diagnosis:  Same  Procedure(s) Performed:  1.  Abdominal aortogram  2.  Bilateral lower extremity distal runoff    Anesthesia: Conscious sedation was administered by the interventional radiology RN under my direct supervision. IV Versed plus fentanyl were utilized. Continuous ECG, pulse oximetry and blood pressure was monitored throughout the entire procedure.  Conscious sedation was administered for a total of 37 minutes.  Sheath: 5 French Pinnacle sheath left common femoral retrograde  Contrast: 60 cc   Fluoroscopy Time: 1.2 minutes  Indications:  The patient presents to [facility] with worsening claudication and mild rest pain symptoms bilateral lower extremities with the right leg more affected than the left.  Pedal pulses are nonpalpable bilaterally suggesting atherosclerotic occlusive disease.  The risks and benefits as well as alternative therapies for lower extremity revascularization are reviewed with the patient all questions are answered the patient agrees to proceed.  The patient is therefore undergoing angiography with the hope for intervention for limb salvage.   Procedure:  Justin Mendez a 84 y.o. male who was identified and appropriate procedural time out was performed.  The patient was then placed supine on the table and prepped and draped in the usual sterile fashion.  Ultrasound was used to evaluate the left common femoral artery.  It was echolucent and pulsatile indicating it is patent .  An ultrasound image was acquired for the permanent record.  A micropuncture needle was used to access the left common femoral artery under direct ultrasound guidance.  The microwire was then advanced  under fluoroscopic guidance without difficulty followed by the micro-sheath.  A 0.035 J wire was advanced without resistance and a 5Fr sheath was placed.    Pigtail catheter was then advanced to the level of T12 and AP projection of the aorta was obtained. Pigtail catheter was then repositioned to above the bifurcation and LAO and RAO view of the pelvis was obtained. Distal runoff was then performed with the pigtail catheter positioned in the distal aorta.  After review of the images the catheter was removed over wire and manual pressure held.  Findings:   Aortogram: The abdominal aorta is opacified with a bolus injection contrast.  It is diffusely diseased but there are no hemodynamically significant lesions noted.  There are bilateral common iliac stents which have their leading edge right at the aortic bifurcation.  On the left there is greater than 80% in-stent stenosis.  On the right the common iliac demonstrates moderate in-stent restenosis.  The external iliac arteries are patent bilaterally but there is greater than 70% eccentric coral reef-like lesions noted in the distal few centimeters of the external iliac arteries bilaterally and this then extends into the common femoral bilaterally.  Right Lower Extremity: The right lower extremity is opacified with contrast and demonstrates extensive collaterals around a occlusion of the common femoral.  The profunda femoris is patent and appears to collateralize toward the knee the superficial femoral artery is profoundly disease and although it is patent there are multifocal areas of hemodynamic significance and occlusion at Hunter's canal.  Trifurcation is reconstituted and there appears to be an AT and posterior tibial which are patent in the proximal portion.  Distally it is unable to be observed.  Left Lower Extremity: The left lower extremity is opacified with  contrast the common femoral demonstrates subtotal occlusion throughout its length as noted  above plaque from the distal external iliac continues on.  There is a focal string sign or occlusion is difficult to ascertain secondary to collaterals coursing across the field in the distal left common femoral.  Profunda femoris is patent and does appear to collateralize to below the knee the SFA is profoundly diseased with multifocal hemodynamically significant lesions and occludes in its midportion.  Given the extensiveness of the disease on the left there is complete nonvisualization below the knee from an aortic injection.  Summary: Given the occlusions of the common femoral arteries bilaterally this situation would be better suited with surgical endarterectomy and reconstruction of the profunda femoris arteries bilaterally.  This will also require treatment of the distal external iliac artery bilaterally.  The lesion noted in the left common iliac will also require treatment and given its open location I would I recommend using a covered stent to reconstruct the aortic bifurcation and a kissing balloon technique and since the right common femoral inaccessible this should be done at the time of surgery and is not undertaken at this time.  Given that the patient has rest pain symptoms and lifestyle limitation but no tissue loss I would not plan for reconstruction of the SFA arteries at the time of surgery.   Disposition: Patient was taken to the recovery room in stable condition having tolerated the procedure well.  Justin Mendez 04/29/2018,12:03 PM

## 2018-04-30 ENCOUNTER — Encounter: Payer: Self-pay | Admitting: Vascular Surgery

## 2018-05-08 ENCOUNTER — Encounter (INDEPENDENT_AMBULATORY_CARE_PROVIDER_SITE_OTHER): Payer: Self-pay

## 2018-05-08 ENCOUNTER — Encounter (INDEPENDENT_AMBULATORY_CARE_PROVIDER_SITE_OTHER): Payer: Self-pay | Admitting: Vascular Surgery

## 2018-05-08 ENCOUNTER — Ambulatory Visit (INDEPENDENT_AMBULATORY_CARE_PROVIDER_SITE_OTHER): Payer: Medicare Other | Admitting: Vascular Surgery

## 2018-05-08 VITALS — BP 112/61 | HR 67 | Resp 16 | Ht 70.0 in | Wt 215.4 lb

## 2018-05-08 DIAGNOSIS — Z87891 Personal history of nicotine dependence: Secondary | ICD-10-CM

## 2018-05-08 DIAGNOSIS — I70223 Atherosclerosis of native arteries of extremities with rest pain, bilateral legs: Secondary | ICD-10-CM

## 2018-05-08 DIAGNOSIS — I4891 Unspecified atrial fibrillation: Secondary | ICD-10-CM

## 2018-05-08 DIAGNOSIS — I25118 Atherosclerotic heart disease of native coronary artery with other forms of angina pectoris: Secondary | ICD-10-CM

## 2018-05-08 DIAGNOSIS — I6523 Occlusion and stenosis of bilateral carotid arteries: Secondary | ICD-10-CM

## 2018-05-08 DIAGNOSIS — I10 Essential (primary) hypertension: Secondary | ICD-10-CM | POA: Diagnosis not present

## 2018-05-11 ENCOUNTER — Encounter (INDEPENDENT_AMBULATORY_CARE_PROVIDER_SITE_OTHER): Payer: Self-pay | Admitting: Vascular Surgery

## 2018-05-11 NOTE — Progress Notes (Signed)
MRN : [id number]  Justin Mendez is a 84 y.o. ([birth year]) male who presents with chief complaint of  Chief Complaint  Patient presents with  . Follow-up    [facility] 1week follow up  .  History of Present Illness:  The patient returns to the office for followup and review status post angiogram with intervention. The patient notes no change in the lower extremity symptoms.  The patient's claudication distance remains severely limited.  He is still working at [employer] and finds it difficult to even walk the store.  He continues to have rest pain symptoms.  No new ulcers or wounds have occurred since the last visit.  There have been no significant changes to the patient's overall health care.  The patient denies amaurosis fugax or recent TIA symptoms. There are no recent neurological changes noted. The patient denies history of DVT, PE or superficial thrombophlebitis. The patient denies recent episodes of angina or shortness of breath.     Current Meds  Medication Sig  . ALPRAZolam (XANAX) 0.5 MG tablet Take 0.5 mg by mouth at bedtime.   Marland Kitchen atorvastatin (LIPITOR) 80 MG tablet Take 80 mg by mouth at bedtime.   . clopidogrel (PLAVIX) 75 MG tablet Take 75 mg by mouth daily.   . clotrimazole-betamethasone (LOTRISONE) cream Apply 2 application topically as needed (itching).   Marland Kitchen ELIQUIS 5 MG TABS tablet Take 1 tablet (5 mg total) by mouth 2 (two) times daily.  Marland Kitchen escitalopram (LEXAPRO) 10 MG tablet Take 10 mg by mouth daily.   . fluticasone (FLONASE) 50 MCG/ACT nasal spray Place 2 sprays into both nostrils daily as needed (congestion).   Marland Kitchen ipratropium (ATROVENT) 0.06 % nasal spray Place 2 sprays into the nose daily.   . isosorbide mononitrate (IMDUR) 30 MG 24 hr tablet Take 30 mg by mouth daily.   . metoprolol succinate (TOPROL-XL) 50 MG 24 hr tablet Take 1 tablet (50 mg total) by mouth 2 (two) times daily. Take with or immediately following a meal.  . Multiple Vitamin (MULTI-VITAMINS) TABS  Take 1 tablet by mouth daily.   . nitroGLYCERIN (NITROSTAT) 0.4 MG SL tablet Place 0.4 mg under the tongue every 5 (five) minutes as needed for chest pain.  Marland Kitchen olmesartan (BENICAR) 20 MG tablet Take 20 mg by mouth every evening.   . pantoprazole (PROTONIX) 40 MG tablet Take 40 mg by mouth daily.  Marland Kitchen spironolactone (ALDACTONE) 25 MG tablet Take 12.5 mg by mouth daily.     Past Medical History:  Diagnosis Date  . H/O carotid endarterectomy   . Hearing loss   . High cholesterol   . Hypertension   . Neuropathy   . PAD (peripheral artery disease) (HCC)     Past Surgical History:  Procedure Laterality Date  . carotid endarderectomy     Bilateral  . KNEE SURGERY     Left  . LOWER EXTREMITY ANGIOGRAPHY Right 04/29/2018   Procedure: LOWER EXTREMITY ANGIOGRAPHY;  Surgeon: Renford Dills, MD;  Location: [facility] INVASIVE CV [facility];  Service: Cardiovascular;  Laterality: Right;  . NECK SURGERY    . PACEMAKER INSERTION Left 01/03/2018   Procedure: INSERTION PACEMAKER-INITIAL DUAL CHAMBER;  Surgeon: Marcina Millard, MD;  Location: [facility] ORS;  Service: Cardiovascular;  Laterality: Left;  . RIGHT/LEFT HEART CATH AND CORONARY ANGIOGRAPHY N/A 04/29/2017   Procedure: RIGHT/LEFT HEART CATH AND CORONARY ANGIOGRAPHY;  Surgeon: Marcina Millard, MD;  Location: [facility] INVASIVE CV [facility];  Service: Cardiovascular;  Laterality: N/A;    Social History  Social History   Tobacco Use  . Smoking status: Former Smoker    Types: Cigarettes    Last attempt to quit: 08/31/1984    Years since quitting: 33.7  . Smokeless tobacco: Never Used  Substance Use Topics  . Alcohol use: No  . Drug use: No    Family History Family History  Problem Relation Age of Onset  . Lung cancer Father     Allergies  Allergen Reactions  . Iodinated Diagnostic Agents Other (See Comments)     Was very "flushed", denies itching, swelling or hives. He states this was over 30 years ago with Ionic contrast and not the current  non-ionic contrast. SPM     REVIEW OF SYSTEMS (Negative unless checked)  Constitutional: [] Weight loss  [] Fever  [] Chills Cardiac: [] Chest pain   [] Chest pressure   [] Palpitations   [] Shortness of breath when laying flat   [] Shortness of breath with exertion. Vascular:  [x] Pain in legs with walking   [x] Pain in legs at rest  [] History of DVT   [] Phlebitis   [] Swelling in legs   [] Varicose veins   [] Non-healing ulcers Pulmonary:   [] Uses home oxygen   [] Productive cough   [] Hemoptysis   [] Wheeze  [] COPD   [] Asthma Neurologic:  [] Dizziness   [] Seizures   [] History of stroke   [] History of TIA  [] Aphasia   [] Vissual changes   [] Weakness or numbness in arm   [] Weakness or numbness in leg Musculoskeletal:   [] Joint swelling   [] Joint pain   [] Low back pain Hematologic:  [] Easy bruising  [] Easy bleeding   [] Hypercoagulable state   [] Anemic Gastrointestinal:  [] Diarrhea   [] Vomiting  [] Gastroesophageal reflux/heartburn   [] Difficulty swallowing. Genitourinary:  [] Chronic kidney disease   [] Difficult urination  [] Frequent urination   [] Blood in urine Skin:  [] Rashes   [] Ulcers  Psychological:  [] History of anxiety   []  History of major depression.  Physical Examination  Vitals:   05/08/18 1329  BP: 112/61  Pulse: 67  Resp: 16  Weight: 215 lb 6.4 oz (97.7 kg)  Height: 5\' 10"  (1.778 m)   Body mass index is 30.91 kg/m. Gen: WD/WN, NAD Head: [place]/AT, No temporalis wasting.  Ear/Nose/Throat: Hearing grossly intact, nares w/o erythema or drainage Eyes: PER, EOMI, sclera nonicteric.  Neck: Supple, no large masses.   Pulmonary:  Good air movement, no audible wheezing bilaterally, no use of accessory muscles.  Cardiac: RRR, no JVD Vascular:  Vessel Right Left  Radial Palpable Palpable  PT Not Palpable Not Palpable  DP Not Palpable Not Palpable  Gastrointestinal: Non-distended. No guarding/no peritoneal signs.  Musculoskeletal: M/S 5/5 throughout.  No deformity or atrophy.  Neurologic: CN  2-12 intact. Symmetrical.  Speech is fluent. Motor exam as listed above. Psychiatric: Judgment intact, Mood & affect appropriate for pt's clinical situation. Dermatologic: No rashes or ulcers noted.  No changes consistent with cellulitis. Lymph : No lichenification or skin changes of chronic lymphedema.  CBC Lab Results  Component Value Date   WBC 8.2 12/31/2017   HGB 15.0 12/31/2017   HCT 44.0 12/31/2017   MCV 92.3 12/31/2017   PLT 191 12/31/2017    BMET    Component Value Date/Time   NA 141 12/31/2017 0335   K 4.5 12/31/2017 0335   CL 109 12/31/2017 0335   CO2 23 12/31/2017 0335   GLUCOSE 101 (H) 12/31/2017 0335   BUN 19 04/29/2018 1059   CREATININE 1.23 04/29/2018 1059   CALCIUM 9.5 12/31/2017 0335   GFRNONAA  52 (L) 04/29/2018 1059   GFRAA >60 04/29/2018 1059   Estimated Creatinine Clearance: 53.4 mL/min (by C-G formula based on SCr of 1.23 mg/dL).  COAG Lab Results  Component Value Date   INR 1.17 04/26/2017   INR 1.17 09/01/2011    Radiology No results found.   Assessment/Plan 1. Atherosclerosis of native artery of both lower extremities with rest pain (HCC)  Recommend:  The patient has evidence of severe atherosclerotic changes of both lower extremities associated with lifestyle limiting claudication and rest pain symptoms.  This represents limb threatening ischemia and places the patient at an increased risk for limb loss.  Angiography has been performed and the situation is not ideal for intervention, he has occlusive common femoral disease.  Given this finding open surgical repair is recommended.   He should have bilateral femoral endarterectomies with bilateral iliac stenting at that time.  SFA occlusions will not be treated as he has rest pain and not tissue loss.  Patient should undergo arterial reconstruction of the lower extremity with the hope for limb salvage.  The risks and benefits as well as the alternative therapies was discussed in detail with  the patient.  All questions were answered.  Patient agrees to proceed with bypass surgery.  The patient will follow up with me in the office after the procedure.    A total of 30 minutes was spent with this patient and greater than 50% was spent in counseling and coordination of care with the patient.  Discussion included the treatment options for vascular disease including indications for surgery and intervention.  Also discussed is the appropriate timing of treatment.  In addition medical therapy was discussed.    2. Bilateral carotid artery stenosis Recommend:  Given the patient's asymptomatic subcritical stenosis no further invasive testing or surgery at this time.  Duplex ultrasound shows <80% stenosis bilaterally.  Continue antiplatelet therapy as prescribed Continue management of CAD, HTN and Hyperlipidemia Healthy heart diet,  encouraged exercise at least 4 times per week Follow up in 12 months with duplex ultrasound and physical exam   3. Essential hypertension Continue antihypertensive medications as already ordered, these medications have been reviewed and there are no changes at this time.   4. Atherosclerosis of native coronary artery of native heart with stable angina pectoris (HCC) Continue cardiac and antihypertensive medications as already ordered and reviewed, no changes at this time.  Continue statin as ordered and reviewed, no changes at this time  Nitrates PRN for chest pain   5. Atrial fibrillation with RVR (HCC) Continue antiarrhythmia medications as already ordered, these medications have been reviewed and there are no changes at this time.  Continue anticoagulation as ordered by Cardiology Service     Levora Dredge, MD  05/11/2018 10:08 AM

## 2018-05-20 ENCOUNTER — Telehealth (INDEPENDENT_AMBULATORY_CARE_PROVIDER_SITE_OTHER): Payer: Self-pay | Admitting: Vascular Surgery

## 2018-05-20 ENCOUNTER — Other Ambulatory Visit (INDEPENDENT_AMBULATORY_CARE_PROVIDER_SITE_OTHER): Payer: Self-pay | Admitting: Nurse Practitioner

## 2018-05-20 NOTE — Telephone Encounter (Signed)
Spoke with the patient's wife and explained that Dr. Gilda Crease has the paperwork for the patient and when he finishes the paperwork it will be faxed to his job at [employer].

## 2018-05-21 ENCOUNTER — Inpatient Hospital Stay: Admission: RE | Admit: 2018-05-21 | Payer: Medicare Other | Source: Ambulatory Visit

## 2018-05-26 ENCOUNTER — Other Ambulatory Visit: Payer: Self-pay

## 2018-05-26 ENCOUNTER — Encounter
Admission: RE | Admit: 2018-05-26 | Discharge: 2018-05-26 | Disposition: A | Discharge status: Home / Self Care | Payer: Medicare Other | Source: Ambulatory Visit | Attending: Vascular Surgery | Admitting: Vascular Surgery

## 2018-05-26 DIAGNOSIS — Z01818 Encounter for other preprocedural examination: Secondary | ICD-10-CM | POA: Insufficient documentation

## 2018-05-26 DIAGNOSIS — R9431 Abnormal electrocardiogram [ECG] [EKG]: Secondary | ICD-10-CM | POA: Insufficient documentation

## 2018-05-26 DIAGNOSIS — I7090 Unspecified atherosclerosis: Secondary | ICD-10-CM | POA: Insufficient documentation

## 2018-05-26 HISTORY — DX: Sleep apnea, unspecified: G47.30

## 2018-05-26 HISTORY — DX: Atherosclerotic heart disease of native coronary artery without angina pectoris: I25.10

## 2018-05-26 HISTORY — DX: Peripheral vascular disease, unspecified: I73.9

## 2018-05-26 HISTORY — DX: Nonrheumatic aortic (valve) stenosis: I35.0

## 2018-05-26 HISTORY — DX: Ischemic cardiomyopathy: I25.5

## 2018-05-26 HISTORY — DX: Gastro-esophageal reflux disease without esophagitis: K21.9

## 2018-05-26 HISTORY — DX: Unspecified osteoarthritis, unspecified site: M19.90

## 2018-05-26 HISTORY — DX: Presence of cardiac pacemaker: Z95.0

## 2018-05-26 HISTORY — DX: Paroxysmal atrial fibrillation: I48.0

## 2018-05-26 LAB — CBC WITH DIFFERENTIAL/PLATELET
Abs Immature Granulocytes: 0.03 10*3/uL (ref 0.00–0.07)
BASOS PCT: 1 %
Basophils Absolute: 0.1 10*3/uL (ref 0.0–0.1)
EOS ABS: 0.4 10*3/uL (ref 0.0–0.5)
EOS PCT: 4 %
HCT: 40 % (ref 39.0–52.0)
Hemoglobin: 13.7 g/dL (ref 13.0–17.0)
IMMATURE GRANULOCYTES: 0 %
LYMPHS ABS: 1.7 10*3/uL (ref 0.7–4.0)
Lymphocytes Relative: 20 %
MCH: 31.8 pg (ref 26.0–34.0)
MCHC: 34.3 g/dL (ref 30.0–36.0)
MCV: 92.8 fL (ref 80.0–100.0)
MONO ABS: 1 10*3/uL (ref 0.1–1.0)
MONOS PCT: 12 %
NEUTROS PCT: 63 %
Neutro Abs: 5.2 10*3/uL (ref 1.7–7.7)
PLATELETS: 217 10*3/uL (ref 150–400)
RBC: 4.31 MIL/uL (ref 4.22–5.81)
RDW: 12.7 % (ref 11.5–15.5)
WBC: 8.3 10*3/uL (ref 4.0–10.5)
nRBC: 0 % (ref 0.0–0.2)

## 2018-05-26 LAB — BASIC METABOLIC PANEL
ANION GAP: 8 (ref 5–15)
BUN: 19 mg/dL (ref 8–23)
CALCIUM: 8.9 mg/dL (ref 8.9–10.3)
CO2: 26 mmol/L (ref 22–32)
CREATININE: 1.28 mg/dL — AB (ref 0.61–1.24)
Chloride: 105 mmol/L (ref 98–111)
GFR calc Af Amer: 58 mL/min — ABNORMAL LOW (ref >60)
GFR, EST NON AFRICAN AMERICAN: 50 mL/min — AB (ref >60)
GLUCOSE: 96 mg/dL (ref 70–99)
Potassium: 4.2 mmol/L (ref 3.5–5.1)
Sodium: 139 mmol/L (ref 135–145)

## 2018-05-26 LAB — TYPE AND SCREEN
ABO/RH(D): A NEG
Antibody Screen: NEGATIVE

## 2018-05-26 LAB — APTT: APTT: 34 s (ref 24–36)

## 2018-05-26 LAB — PROTIME-INR
INR: 1.36
Prothrombin Time: 16.6 seconds — ABNORMAL HIGH (ref 11.4–15.2)

## 2018-05-26 NOTE — Patient Instructions (Addendum)
Your procedure is scheduled on: Wednesday, May 28, 2018 Report to Day Surgery on the 2nd floor of the [facility] To find out your arrival time, please call [phone] between 1PM - 3PM on: Tuesday, May 27, 2018  REMEMBER: Instructions that are not followed completely may result in serious medical risk, up to and including death; or upon the discretion of your surgeon and anesthesiologist your surgery may need to be rescheduled.  Do not eat food after midnight the night before surgery.  No gum chewing, lozengers or hard candies.  You may however, drink CLEAR liquids up to 2 hours before you are scheduled to arrive for your surgery. Do not drink anything within 2 hours of the start of your surgery.  Clear liquids include: - water  - apple juice without pulp - gatorade - black coffee or tea (Do NOT add milk or creamers to the coffee or tea) Do NOT drink anything that is not on this list.  No Alcohol for 24 hours before or after surgery.  No Smoking including e-cigarettes for 24 hours prior to surgery.  No chewable tobacco products for at least 6 hours prior to surgery.  No nicotine patches on the day of surgery.  On the morning of surgery brush your teeth with toothpaste and water, you may rinse your mouth with mouthwash if you wish. Do not swallow any toothpaste or mouthwash.  Notify your doctor if there is any change in your medical condition (cold, fever, infection).  Do not wear jewelry, make-up, hairpins, clips or nail polish.  Do not wear lotions, powders, or perfumes.   Do not shave 48 hours prior to surgery.   Contacts and dentures may not be worn into surgery.  Do not bring valuables to the hospital, including drivers license, insurance or credit cards.  [facility] is not responsible for any belongings or valuables.   TAKE THESE MEDICATIONS THE MORNING OF SURGERY:  1.  escitalopram (Lexapro) 2.  Isosorbide mononitrate (Imdur) 3.  Metoprolol  succinate (Toprol) 4.  Pantoprazole (Protonix) - (take one the night before and one on the morning of surgery - helps to prevent nausea after surgery.)  Use CHG Soap as directed on instruction sheet.  Bring your C-PAP to the hospital with you in case you may have to spend the night.   STOP ELIQUIS NOW.   CONTINUE PLAVIX UP UNTIL THE MORNING OF SURGERY - JUST DON'T TAKE IT THE DAY OF SURGERY.  NOW!  Stop Anti-inflammatories (NSAIDS) such as Advil, Aleve, Ibuprofen, Motrin, Naproxen, Naprosyn and Aspirin based products such as Excedrin, Goodys Powder, BC Powder. (May take Tylenol or Acetaminophen if needed.)  NOW!  Stop ANY OVER THE COUNTER supplements until after surgery. (CO-ENZYME Q10) (May continue multivitamin.)  Wear comfortable clothing (specific to your surgery type) to the hospital.  Plan for stool softeners for home use.  If you are being admitted to the hospital overnight, leave your suitcase in the car. After surgery it may be brought to your room.  Please call [phone] if you have any questions about these instructions.

## 2018-05-27 ENCOUNTER — Encounter: Payer: Self-pay | Admitting: Anesthesiology

## 2018-05-27 MED ORDER — CEFAZOLIN SODIUM-DEXTROSE 2-4 GM/100ML-% IV SOLN
2.0000 g | INTRAVENOUS | Status: AC
  Administered 2018-05-28: 2 g via INTRAVENOUS

## 2018-05-28 ENCOUNTER — Inpatient Hospital Stay: Payer: Medicare Other | Admitting: Anesthesiology

## 2018-05-28 ENCOUNTER — Other Ambulatory Visit: Payer: Self-pay

## 2018-05-28 ENCOUNTER — Encounter: Admission: RE | Disposition: E | Payer: Self-pay | Source: Home / Self Care | Attending: Vascular Surgery

## 2018-05-28 ENCOUNTER — Inpatient Hospital Stay: Payer: Medicare Other

## 2018-05-28 ENCOUNTER — Inpatient Hospital Stay
Admission: RE | Admit: 2018-05-28 | Discharge: 2018-06-15 | DRG: 270 | Disposition: E | Payer: Medicare Other | Attending: Vascular Surgery | Admitting: Vascular Surgery

## 2018-05-28 ENCOUNTER — Encounter: Payer: Self-pay | Admitting: *Deleted

## 2018-05-28 DIAGNOSIS — G629 Polyneuropathy, unspecified: Secondary | ICD-10-CM | POA: Diagnosis present

## 2018-05-28 DIAGNOSIS — I480 Paroxysmal atrial fibrillation: Secondary | ICD-10-CM | POA: Diagnosis present

## 2018-05-28 DIAGNOSIS — Z4659 Encounter for fitting and adjustment of other gastrointestinal appliance and device: Secondary | ICD-10-CM

## 2018-05-28 DIAGNOSIS — I6523 Occlusion and stenosis of bilateral carotid arteries: Secondary | ICD-10-CM | POA: Diagnosis present

## 2018-05-28 DIAGNOSIS — I97191 Other postprocedural cardiac functional disturbances following other surgery: Secondary | ICD-10-CM | POA: Diagnosis not present

## 2018-05-28 DIAGNOSIS — R570 Cardiogenic shock: Secondary | ICD-10-CM

## 2018-05-28 DIAGNOSIS — I214 Non-ST elevation (NSTEMI) myocardial infarction: Secondary | ICD-10-CM

## 2018-05-28 DIAGNOSIS — R571 Hypovolemic shock: Secondary | ICD-10-CM | POA: Diagnosis not present

## 2018-05-28 DIAGNOSIS — F05 Delirium due to known physiological condition: Secondary | ICD-10-CM | POA: Diagnosis not present

## 2018-05-28 DIAGNOSIS — I25118 Atherosclerotic heart disease of native coronary artery with other forms of angina pectoris: Secondary | ICD-10-CM | POA: Diagnosis present

## 2018-05-28 DIAGNOSIS — N183 Chronic kidney disease, stage 3 (moderate): Secondary | ICD-10-CM | POA: Diagnosis present

## 2018-05-28 DIAGNOSIS — Z79899 Other long term (current) drug therapy: Secondary | ICD-10-CM

## 2018-05-28 DIAGNOSIS — R0600 Dyspnea, unspecified: Secondary | ICD-10-CM

## 2018-05-28 DIAGNOSIS — E872 Acidosis: Secondary | ICD-10-CM | POA: Diagnosis not present

## 2018-05-28 DIAGNOSIS — Z801 Family history of malignant neoplasm of trachea, bronchus and lung: Secondary | ICD-10-CM

## 2018-05-28 DIAGNOSIS — J9601 Acute respiratory failure with hypoxia: Secondary | ICD-10-CM | POA: Diagnosis not present

## 2018-05-28 DIAGNOSIS — H9190 Unspecified hearing loss, unspecified ear: Secondary | ICD-10-CM | POA: Diagnosis present

## 2018-05-28 DIAGNOSIS — I2510 Atherosclerotic heart disease of native coronary artery without angina pectoris: Secondary | ICD-10-CM | POA: Diagnosis not present

## 2018-05-28 DIAGNOSIS — R0902 Hypoxemia: Secondary | ICD-10-CM

## 2018-05-28 DIAGNOSIS — K219 Gastro-esophageal reflux disease without esophagitis: Secondary | ICD-10-CM | POA: Diagnosis present

## 2018-05-28 DIAGNOSIS — N179 Acute kidney failure, unspecified: Secondary | ICD-10-CM | POA: Diagnosis not present

## 2018-05-28 DIAGNOSIS — I70229 Atherosclerosis of native arteries of extremities with rest pain, unspecified extremity: Secondary | ICD-10-CM | POA: Diagnosis present

## 2018-05-28 DIAGNOSIS — Z66 Do not resuscitate: Secondary | ICD-10-CM | POA: Diagnosis not present

## 2018-05-28 DIAGNOSIS — J9600 Acute respiratory failure, unspecified whether with hypoxia or hypercapnia: Secondary | ICD-10-CM

## 2018-05-28 DIAGNOSIS — Z95828 Presence of other vascular implants and grafts: Secondary | ICD-10-CM

## 2018-05-28 DIAGNOSIS — I130 Hypertensive heart and chronic kidney disease with heart failure and stage 1 through stage 4 chronic kidney disease, or unspecified chronic kidney disease: Secondary | ICD-10-CM | POA: Diagnosis present

## 2018-05-28 DIAGNOSIS — D500 Iron deficiency anemia secondary to blood loss (chronic): Secondary | ICD-10-CM | POA: Diagnosis present

## 2018-05-28 DIAGNOSIS — J9602 Acute respiratory failure with hypercapnia: Secondary | ICD-10-CM | POA: Diagnosis not present

## 2018-05-28 DIAGNOSIS — Z87891 Personal history of nicotine dependence: Secondary | ICD-10-CM

## 2018-05-28 DIAGNOSIS — I70223 Atherosclerosis of native arteries of extremities with rest pain, bilateral legs: Secondary | ICD-10-CM | POA: Diagnosis present

## 2018-05-28 DIAGNOSIS — I10 Essential (primary) hypertension: Secondary | ICD-10-CM | POA: Diagnosis not present

## 2018-05-28 DIAGNOSIS — Z6831 Body mass index (BMI) 31.0-31.9, adult: Secondary | ICD-10-CM

## 2018-05-28 DIAGNOSIS — I350 Nonrheumatic aortic (valve) stenosis: Secondary | ICD-10-CM | POA: Diagnosis present

## 2018-05-28 DIAGNOSIS — Z978 Presence of other specified devices: Secondary | ICD-10-CM

## 2018-05-28 DIAGNOSIS — E7800 Pure hypercholesterolemia, unspecified: Secondary | ICD-10-CM | POA: Diagnosis present

## 2018-05-28 DIAGNOSIS — N19 Unspecified kidney failure: Secondary | ICD-10-CM

## 2018-05-28 DIAGNOSIS — I9581 Postprocedural hypotension: Secondary | ICD-10-CM | POA: Diagnosis not present

## 2018-05-28 DIAGNOSIS — I5020 Unspecified systolic (congestive) heart failure: Secondary | ICD-10-CM | POA: Diagnosis present

## 2018-05-28 DIAGNOSIS — Z8679 Personal history of other diseases of the circulatory system: Secondary | ICD-10-CM

## 2018-05-28 DIAGNOSIS — G4733 Obstructive sleep apnea (adult) (pediatric): Secondary | ICD-10-CM | POA: Diagnosis present

## 2018-05-28 DIAGNOSIS — I255 Ischemic cardiomyopathy: Secondary | ICD-10-CM | POA: Diagnosis present

## 2018-05-28 DIAGNOSIS — Z825 Family history of asthma and other chronic lower respiratory diseases: Secondary | ICD-10-CM

## 2018-05-28 DIAGNOSIS — Z7901 Long term (current) use of anticoagulants: Secondary | ICD-10-CM

## 2018-05-28 DIAGNOSIS — E669 Obesity, unspecified: Secondary | ICD-10-CM | POA: Diagnosis present

## 2018-05-28 DIAGNOSIS — Z7902 Long term (current) use of antithrombotics/antiplatelets: Secondary | ICD-10-CM

## 2018-05-28 DIAGNOSIS — G8918 Other acute postprocedural pain: Secondary | ICD-10-CM | POA: Diagnosis not present

## 2018-05-28 DIAGNOSIS — Z950 Presence of cardiac pacemaker: Secondary | ICD-10-CM

## 2018-05-28 DIAGNOSIS — Z7951 Long term (current) use of inhaled steroids: Secondary | ICD-10-CM

## 2018-05-28 DIAGNOSIS — N170 Acute kidney failure with tubular necrosis: Secondary | ICD-10-CM | POA: Diagnosis not present

## 2018-05-28 DIAGNOSIS — Z419 Encounter for procedure for purposes other than remedying health state, unspecified: Secondary | ICD-10-CM

## 2018-05-28 DIAGNOSIS — Z91041 Radiographic dye allergy status: Secondary | ICD-10-CM

## 2018-05-28 DIAGNOSIS — Z96653 Presence of artificial knee joint, bilateral: Secondary | ICD-10-CM | POA: Diagnosis present

## 2018-05-28 HISTORY — PX: ENDARTERECTOMY FEMORAL: SHX5804

## 2018-05-28 HISTORY — PX: INSERTION OF ILIAC STENT: SHX6256

## 2018-05-28 LAB — CBC WITH DIFFERENTIAL/PLATELET
ABS IMMATURE GRANULOCYTES: 0.08 10*3/uL — AB (ref 0.00–0.07)
BASOS ABS: 0 10*3/uL (ref 0.0–0.1)
BASOS PCT: 0 %
EOS ABS: 0 10*3/uL (ref 0.0–0.5)
Eosinophils Relative: 0 %
HEMATOCRIT: 33.4 % — AB (ref 39.0–52.0)
Hemoglobin: 11.1 g/dL — ABNORMAL LOW (ref 13.0–17.0)
IMMATURE GRANULOCYTES: 1 %
LYMPHS ABS: 0.7 10*3/uL (ref 0.7–4.0)
Lymphocytes Relative: 5 %
MCH: 31.1 pg (ref 26.0–34.0)
MCHC: 33.2 g/dL (ref 30.0–36.0)
MCV: 93.6 fL (ref 80.0–100.0)
MONOS PCT: 4 %
Monocytes Absolute: 0.6 10*3/uL (ref 0.1–1.0)
NEUTROS ABS: 13.7 10*3/uL — AB (ref 1.7–7.7)
NEUTROS PCT: 90 %
NRBC: 0 % (ref 0.0–0.2)
PLATELETS: 182 10*3/uL (ref 150–400)
RBC: 3.57 MIL/uL — ABNORMAL LOW (ref 4.22–5.81)
RDW: 12.6 % (ref 11.5–15.5)
WBC: 15.1 10*3/uL — ABNORMAL HIGH (ref 4.0–10.5)

## 2018-05-28 LAB — PROTIME-INR
INR: 1.14
Prothrombin Time: 14.5 seconds (ref 11.4–15.2)

## 2018-05-28 LAB — BASIC METABOLIC PANEL
ANION GAP: 10 (ref 5–15)
BUN: 20 mg/dL (ref 8–23)
CO2: 22 mmol/L (ref 22–32)
Calcium: 8.7 mg/dL — ABNORMAL LOW (ref 8.9–10.3)
Chloride: 111 mmol/L (ref 98–111)
Creatinine, Ser: 1.3 mg/dL — ABNORMAL HIGH (ref 0.61–1.24)
GFR calc non Af Amer: 49 mL/min — ABNORMAL LOW (ref >60)
GFR, EST AFRICAN AMERICAN: 57 mL/min — AB (ref >60)
Glucose, Bld: 135 mg/dL — ABNORMAL HIGH (ref 70–99)
POTASSIUM: 4.6 mmol/L (ref 3.5–5.1)
Sodium: 143 mmol/L (ref 135–145)

## 2018-05-28 LAB — CBC
HCT: 33.5 % — ABNORMAL LOW (ref 39.0–52.0)
HEMATOCRIT: 36.2 % — AB (ref 39.0–52.0)
HEMOGLOBIN: 11 g/dL — AB (ref 13.0–17.0)
HEMOGLOBIN: 12.3 g/dL — AB (ref 13.0–17.0)
MCH: 31.3 pg (ref 26.0–34.0)
MCH: 31.9 pg (ref 26.0–34.0)
MCHC: 32.8 g/dL (ref 30.0–36.0)
MCHC: 34 g/dL (ref 30.0–36.0)
MCV: 95.2 fL (ref 80.0–100.0)
MCV: 94 fL (ref 80.0–100.0)
NRBC: 0 % (ref 0.0–0.2)
NRBC: 0 % (ref 0.0–0.2)
PLATELETS: 189 10*3/uL (ref 150–400)
Platelets: 224 10*3/uL (ref 150–400)
RBC: 3.52 MIL/uL — AB (ref 4.22–5.81)
RBC: 3.85 MIL/uL — ABNORMAL LOW (ref 4.22–5.81)
RDW: 12.7 % (ref 11.5–15.5)
RDW: 12.8 % (ref 11.5–15.5)
WBC: 10.9 10*3/uL — AB (ref 4.0–10.5)
WBC: 22.6 10*3/uL — AB (ref 4.0–10.5)

## 2018-05-28 LAB — MRSA PCR SCREENING: MRSA by PCR: NEGATIVE

## 2018-05-28 LAB — GLUCOSE, CAPILLARY: Glucose-Capillary: 135 mg/dL — ABNORMAL HIGH (ref 70–99)

## 2018-05-28 LAB — ABO/RH: ABO/RH(D): A NEG

## 2018-05-28 SURGERY — ENDARTERECTOMY, FEMORAL
Anesthesia: General | Laterality: Bilateral

## 2018-05-28 MED ORDER — GUAIFENESIN-DM 100-10 MG/5ML PO SYRP
15.0000 mL | ORAL_SOLUTION | ORAL | Status: DC | PRN
  Filled 2018-05-28: qty 15

## 2018-05-28 MED ORDER — SODIUM CHLORIDE 0.9 % IV SOLN
INTRAVENOUS | Status: DC | PRN
  Administered 2018-05-28: 40 ug/min via INTRAVENOUS

## 2018-05-28 MED ORDER — FENTANYL CITRATE (PF) 100 MCG/2ML IJ SOLN
25.0000 ug | INTRAMUSCULAR | Status: DC | PRN
  Administered 2018-05-28 (×2): 25 ug via INTRAVENOUS

## 2018-05-28 MED ORDER — ONDANSETRON HCL 4 MG/2ML IJ SOLN: 4.0000 mg | Freq: Four times a day (QID) | INTRAMUSCULAR | Status: DC | PRN

## 2018-05-28 MED ORDER — SODIUM CHLORIDE 0.9 % IV SOLN: 500.0000 mL | Freq: Once | INTRAVENOUS | Status: DC | PRN

## 2018-05-28 MED ORDER — METOPROLOL TARTRATE 5 MG/5ML IV SOLN: 2.0000 mg | INTRAVENOUS | Status: DC | PRN

## 2018-05-28 MED ORDER — HEPARIN SODIUM (PORCINE) 1000 UNIT/ML IJ SOLN
INTRAMUSCULAR | Status: AC
  Filled 2018-05-28: qty 1

## 2018-05-28 MED ORDER — LABETALOL HCL 5 MG/ML IV SOLN: 10.0000 mg | INTRAVENOUS | Status: DC | PRN

## 2018-05-28 MED ORDER — ROCURONIUM BROMIDE 50 MG/5ML IV SOLN
INTRAVENOUS | Status: AC
  Filled 2018-05-28: qty 1

## 2018-05-28 MED ORDER — OXYCODONE-ACETAMINOPHEN 5-325 MG PO TABS
1.0000 | ORAL_TABLET | ORAL | Status: DC | PRN
  Administered 2018-05-28: 2 via ORAL
  Administered 2018-05-30: 1 via ORAL
  Administered 2018-05-30: 2 via ORAL
  Administered 2018-05-31: 1 via ORAL
  Filled 2018-05-28: qty 2
  Filled 2018-05-28: qty 1
  Filled 2018-05-28: qty 2
  Filled 2018-05-28: qty 1

## 2018-05-28 MED ORDER — ENOXAPARIN SODIUM 40 MG/0.4ML ~~LOC~~ SOLN
40.0000 mg | SUBCUTANEOUS | Status: DC
  Administered 2018-05-29: 40 mg via SUBCUTANEOUS
  Filled 2018-05-28: qty 0.4

## 2018-05-28 MED ORDER — EPHEDRINE SULFATE 50 MG/ML IJ SOLN
INTRAMUSCULAR | Status: AC
  Filled 2018-05-28: qty 1

## 2018-05-28 MED ORDER — SODIUM CHLORIDE 0.9 % IV SOLN
INTRAVENOUS | Status: DC | PRN
  Administered 2018-05-28: 200 mL via INTRAMUSCULAR

## 2018-05-28 MED ORDER — DOPAMINE-DEXTROSE 3.2-5 MG/ML-% IV SOLN
INTRAVENOUS | Status: AC
  Filled 2018-05-28: qty 250

## 2018-05-28 MED ORDER — EVICEL 5 ML EX KIT
PACK | CUTANEOUS | Status: AC
  Filled 2018-05-28: qty 1

## 2018-05-28 MED ORDER — ISOSORBIDE MONONITRATE ER 30 MG PO TB24: 30.0000 mg | ORAL_TABLET | Freq: Every day | ORAL | Status: DC

## 2018-05-28 MED ORDER — PROPOFOL 10 MG/ML IV BOLUS
INTRAVENOUS | Status: AC
  Filled 2018-05-28: qty 20

## 2018-05-28 MED ORDER — LIDOCAINE HCL (PF) 2 % IJ SOLN
INTRAMUSCULAR | Status: AC
  Filled 2018-05-28: qty 10

## 2018-05-28 MED ORDER — ACETAMINOPHEN 325 MG PO TABS: 325.0000 mg | ORAL_TABLET | ORAL | Status: DC | PRN

## 2018-05-28 MED ORDER — SUGAMMADEX SODIUM 200 MG/2ML IV SOLN
INTRAVENOUS | Status: AC
  Filled 2018-05-28: qty 2

## 2018-05-28 MED ORDER — LIDOCAINE HCL (CARDIAC) PF 100 MG/5ML IV SOSY
PREFILLED_SYRINGE | INTRAVENOUS | Status: DC | PRN
  Administered 2018-05-28: 60 mg via INTRAVENOUS

## 2018-05-28 MED ORDER — ADULT MULTIVITAMIN W/MINERALS CH
ORAL_TABLET | Freq: Every day | ORAL | Status: DC
  Administered 2018-05-29 – 2018-05-31 (×2): 1 via ORAL
  Filled 2018-05-28 (×3): qty 1

## 2018-05-28 MED ORDER — DEXAMETHASONE SODIUM PHOSPHATE 10 MG/ML IJ SOLN
INTRAMUSCULAR | Status: DC | PRN
  Administered 2018-05-28: 10 mg via INTRAVENOUS

## 2018-05-28 MED ORDER — MAGNESIUM OXIDE 400 (241.3 MG) MG PO TABS
400.0000 mg | ORAL_TABLET | Freq: Every day | ORAL | Status: DC
  Administered 2018-05-29 – 2018-05-31 (×2): 400 mg via ORAL
  Filled 2018-05-28 (×3): qty 1

## 2018-05-28 MED ORDER — PHENOL 1.4 % MT LIQD
1.0000 | OROMUCOSAL | Status: DC | PRN
  Filled 2018-05-28: qty 177

## 2018-05-28 MED ORDER — PHENYLEPHRINE HCL 10 MG/ML IJ SOLN
INTRAMUSCULAR | Status: DC | PRN
  Administered 2018-05-28 (×5): 100 ug via INTRAVENOUS

## 2018-05-28 MED ORDER — EVICEL 5 ML EX KIT
PACK | CUTANEOUS | Status: DC | PRN
  Administered 2018-05-28: 5 mL

## 2018-05-28 MED ORDER — ENOXAPARIN SODIUM 30 MG/0.3ML ~~LOC~~ SOLN: 30.0000 mg | SUBCUTANEOUS | Status: DC

## 2018-05-28 MED ORDER — HYDRALAZINE HCL 20 MG/ML IJ SOLN: 5.0000 mg | INTRAMUSCULAR | Status: DC | PRN

## 2018-05-28 MED ORDER — SUGAMMADEX SODIUM 200 MG/2ML IV SOLN
INTRAVENOUS | Status: DC | PRN
  Administered 2018-05-28: 200 mg via INTRAVENOUS

## 2018-05-28 MED ORDER — ALBUMIN HUMAN 5 % IV SOLN
INTRAVENOUS | Status: DC | PRN
  Administered 2018-05-28 (×2): via INTRAVENOUS

## 2018-05-28 MED ORDER — SENNOSIDES-DOCUSATE SODIUM 8.6-50 MG PO TABS: 1.0000 | ORAL_TABLET | Freq: Every evening | ORAL | Status: DC | PRN

## 2018-05-28 MED ORDER — ONDANSETRON HCL 4 MG/2ML IJ SOLN
INTRAMUSCULAR | Status: AC
  Filled 2018-05-28: qty 2

## 2018-05-28 MED ORDER — HYDROMORPHONE HCL 1 MG/ML IJ SOLN: 1.0000 mg | Freq: Once | INTRAMUSCULAR | Status: DC | PRN

## 2018-05-28 MED ORDER — FENTANYL CITRATE (PF) 100 MCG/2ML IJ SOLN
INTRAMUSCULAR | Status: AC
  Administered 2018-05-28: 25 ug via INTRAVENOUS
  Filled 2018-05-28: qty 2

## 2018-05-28 MED ORDER — HEPARIN SODIUM (PORCINE) 5000 UNIT/ML IJ SOLN
INTRAMUSCULAR | Status: AC
  Filled 2018-05-28: qty 1

## 2018-05-28 MED ORDER — LACTATED RINGERS IV SOLN
INTRAVENOUS | Status: DC
  Administered 2018-05-28: 08:00:00 via INTRAVENOUS

## 2018-05-28 MED ORDER — CLOPIDOGREL BISULFATE 75 MG PO TABS: 75.0000 mg | ORAL_TABLET | Freq: Every day | ORAL | Status: DC

## 2018-05-28 MED ORDER — SODIUM CHLORIDE (PF) 0.9 % IJ SOLN
INTRAMUSCULAR | Status: AC
  Filled 2018-05-28: qty 50

## 2018-05-28 MED ORDER — DOCUSATE SODIUM 100 MG PO CAPS
100.0000 mg | ORAL_CAPSULE | Freq: Every day | ORAL | Status: DC
  Administered 2018-05-29 – 2018-05-31 (×3): 100 mg via ORAL
  Filled 2018-05-28 (×3): qty 1

## 2018-05-28 MED ORDER — ACETAMINOPHEN 650 MG RE SUPP: 325.0000 mg | RECTAL | Status: DC | PRN

## 2018-05-28 MED ORDER — SORBITOL 70 % SOLN
30.0000 mL | Freq: Every day | Status: DC | PRN
  Filled 2018-05-28: qty 30

## 2018-05-28 MED ORDER — CLOPIDOGREL BISULFATE 75 MG PO TABS
75.0000 mg | ORAL_TABLET | Freq: Every day | ORAL | Status: DC
  Administered 2018-05-29 – 2018-05-31 (×3): 75 mg via ORAL
  Filled 2018-05-28 (×3): qty 1

## 2018-05-28 MED ORDER — CEFAZOLIN SODIUM-DEXTROSE 2-4 GM/100ML-% IV SOLN
2.0000 g | Freq: Three times a day (TID) | INTRAVENOUS | Status: AC
  Administered 2018-05-28 – 2018-05-29 (×2): 2 g via INTRAVENOUS
  Filled 2018-05-28 (×2): qty 100

## 2018-05-28 MED ORDER — MORPHINE SULFATE (PF) 2 MG/ML IV SOLN: 2.0000 mg | Freq: Once | INTRAVENOUS | Status: DC

## 2018-05-28 MED ORDER — FENTANYL CITRATE (PF) 100 MCG/2ML IJ SOLN
INTRAMUSCULAR | Status: AC
  Filled 2018-05-28: qty 2

## 2018-05-28 MED ORDER — COENZYME Q10 10 MG PO CAPS: 10.0000 mg | ORAL_CAPSULE | Freq: Every day | ORAL | Status: DC

## 2018-05-28 MED ORDER — LACTATED RINGERS IV SOLN
INTRAVENOUS | Status: DC | PRN
  Administered 2018-05-28: 08:00:00 via INTRAVENOUS

## 2018-05-28 MED ORDER — IRBESARTAN 75 MG PO TABS
75.0000 mg | ORAL_TABLET | Freq: Every day | ORAL | Status: DC
  Administered 2018-05-29: 75 mg via ORAL
  Filled 2018-05-28: qty 1

## 2018-05-28 MED ORDER — CEFAZOLIN SODIUM-DEXTROSE 2-4 GM/100ML-% IV SOLN
INTRAVENOUS | Status: AC
  Filled 2018-05-28: qty 100

## 2018-05-28 MED ORDER — DOPAMINE-DEXTROSE 3.2-5 MG/ML-% IV SOLN
2.0000 ug/kg/min | INTRAVENOUS | Status: DC
  Administered 2018-05-28: 5 ug/kg/min via INTRAVENOUS
  Administered 2018-05-28: 6 ug/kg/min via INTRAVENOUS

## 2018-05-28 MED ORDER — POTASSIUM CHLORIDE CRYS ER 20 MEQ PO TBCR: 20.0000 meq | EXTENDED_RELEASE_TABLET | Freq: Every day | ORAL | Status: DC | PRN

## 2018-05-28 MED ORDER — BUPIVACAINE HCL (PF) 0.5 % IJ SOLN
INTRAMUSCULAR | Status: DC | PRN
  Administered 2018-05-28: 30 mL

## 2018-05-28 MED ORDER — DEXMEDETOMIDINE HCL 200 MCG/2ML IV SOLN
INTRAVENOUS | Status: DC | PRN
  Administered 2018-05-28 (×3): 4 ug via INTRAVENOUS

## 2018-05-28 MED ORDER — ONDANSETRON HCL 4 MG/2ML IJ SOLN
INTRAMUSCULAR | Status: DC | PRN
  Administered 2018-05-28: 4 mg via INTRAVENOUS

## 2018-05-28 MED ORDER — METOPROLOL SUCCINATE ER 25 MG PO TB24
50.0000 mg | ORAL_TABLET | Freq: Two times a day (BID) | ORAL | Status: DC
  Administered 2018-05-28: 50 mg via ORAL
  Filled 2018-05-28 (×3): qty 2

## 2018-05-28 MED ORDER — NITROGLYCERIN 0.4 MG SL SUBL
0.4000 mg | SUBLINGUAL_TABLET | SUBLINGUAL | Status: DC | PRN
  Administered 2018-05-29: 0.4 mg via SUBLINGUAL
  Filled 2018-05-28: qty 1

## 2018-05-28 MED ORDER — PROPOFOL 10 MG/ML IV BOLUS
INTRAVENOUS | Status: DC | PRN
  Administered 2018-05-28: 120 mg via INTRAVENOUS

## 2018-05-28 MED ORDER — ATORVASTATIN CALCIUM 20 MG PO TABS
40.0000 mg | ORAL_TABLET | Freq: Every day | ORAL | Status: DC
  Administered 2018-05-28 – 2018-05-30 (×3): 40 mg via ORAL
  Filled 2018-05-28 (×4): qty 2

## 2018-05-28 MED ORDER — HEPARIN SODIUM (PORCINE) 1000 UNIT/ML IJ SOLN
INTRAMUSCULAR | Status: DC | PRN
  Administered 2018-05-28: 2000 [IU] via INTRAVENOUS
  Administered 2018-05-28: 1000 [IU] via INTRAVENOUS
  Administered 2018-05-28: 5000 [IU] via INTRAVENOUS

## 2018-05-28 MED ORDER — PHENYLEPHRINE HCL 10 MG/ML IJ SOLN
INTRAMUSCULAR | Status: AC
  Filled 2018-05-28: qty 1

## 2018-05-28 MED ORDER — IPRATROPIUM BROMIDE 0.06 % NA SOLN
2.0000 | Freq: Every day | NASAL | Status: DC
  Administered 2018-05-29: 2 via NASAL
  Filled 2018-05-28: qty 15

## 2018-05-28 MED ORDER — FENTANYL CITRATE (PF) 100 MCG/2ML IJ SOLN
INTRAMUSCULAR | Status: DC | PRN
  Administered 2018-05-28: 50 ug via INTRAVENOUS
  Administered 2018-05-28: 100 ug via INTRAVENOUS

## 2018-05-28 MED ORDER — SPIRONOLACTONE 25 MG PO TABS: 12.5000 mg | ORAL_TABLET | Freq: Every day | ORAL | Status: DC

## 2018-05-28 MED ORDER — DEXAMETHASONE SODIUM PHOSPHATE 10 MG/ML IJ SOLN
INTRAMUSCULAR | Status: AC
  Filled 2018-05-28: qty 1

## 2018-05-28 MED ORDER — SODIUM CHLORIDE 0.9 % IV SOLN
INTRAVENOUS | Status: DC | PRN
  Administered 2018-05-28: 70 mL

## 2018-05-28 MED ORDER — ALUM & MAG HYDROXIDE-SIMETH 200-200-20 MG/5ML PO SUSP
15.0000 mL | ORAL | Status: DC | PRN
  Filled 2018-05-28: qty 30

## 2018-05-28 MED ORDER — ESCITALOPRAM OXALATE 10 MG PO TABS
10.0000 mg | ORAL_TABLET | Freq: Every day | ORAL | Status: DC
  Administered 2018-05-29 – 2018-05-31 (×3): 10 mg via ORAL
  Filled 2018-05-28 (×4): qty 1

## 2018-05-28 MED ORDER — ROCURONIUM BROMIDE 100 MG/10ML IV SOLN
INTRAVENOUS | Status: DC | PRN
  Administered 2018-05-28 (×4): 10 mg via INTRAVENOUS
  Administered 2018-05-28: 30 mg via INTRAVENOUS
  Administered 2018-05-28 (×2): 20 mg via INTRAVENOUS
  Administered 2018-05-28: 10 mg via INTRAVENOUS

## 2018-05-28 MED ORDER — SODIUM CHLORIDE 0.9 % IV SOLN
INTRAVENOUS | Status: DC
  Administered 2018-05-28: 18:00:00 via INTRAVENOUS

## 2018-05-28 MED ORDER — DIPHENHYDRAMINE HCL 50 MG/ML IJ SOLN
INTRAMUSCULAR | Status: DC | PRN
  Administered 2018-05-28: 25 mg via INTRAVENOUS

## 2018-05-28 MED ORDER — MAGNESIUM SULFATE 2 GM/50ML IV SOLN: 2.0000 g | Freq: Every day | INTRAVENOUS | Status: DC | PRN

## 2018-05-28 MED ORDER — ONDANSETRON HCL 4 MG/2ML IJ SOLN: 4.0000 mg | Freq: Once | INTRAMUSCULAR | Status: DC | PRN

## 2018-05-28 MED ORDER — FAMOTIDINE IN NACL 20-0.9 MG/50ML-% IV SOLN: 20.0000 mg | Freq: Two times a day (BID) | INTRAVENOUS | Status: DC

## 2018-05-28 MED ORDER — FLUTICASONE PROPIONATE 50 MCG/ACT NA SUSP
2.0000 | Freq: Every day | NASAL | Status: DC | PRN
  Filled 2018-05-28: qty 16

## 2018-05-28 MED ORDER — APIXABAN 5 MG PO TABS: 5.0000 mg | ORAL_TABLET | Freq: Two times a day (BID) | ORAL | Status: DC

## 2018-05-28 MED ORDER — IOPAMIDOL (ISOVUE-300) INJECTION 61%
INTRAVENOUS | Status: DC | PRN
  Administered 2018-05-28: 50 mL via INTRA_ARTERIAL

## 2018-05-28 MED ORDER — MORPHINE SULFATE (PF) 2 MG/ML IV SOLN
2.0000 mg | INTRAVENOUS | Status: DC | PRN
  Administered 2018-05-28: 2 mg via INTRAVENOUS
  Filled 2018-05-28: qty 1

## 2018-05-28 MED ORDER — GUAIFENESIN ER 600 MG PO TB12
600.0000 mg | ORAL_TABLET | Freq: Two times a day (BID) | ORAL | Status: DC
  Administered 2018-05-28 – 2018-05-31 (×6): 600 mg via ORAL
  Filled 2018-05-28 (×7): qty 1

## 2018-05-28 MED ORDER — PANTOPRAZOLE SODIUM 40 MG PO TBEC
40.0000 mg | DELAYED_RELEASE_TABLET | Freq: Every day | ORAL | Status: DC
  Administered 2018-05-29 – 2018-05-31 (×3): 40 mg via ORAL
  Filled 2018-05-28 (×3): qty 1

## 2018-05-28 MED ORDER — BUPIVACAINE HCL (PF) 0.5 % IJ SOLN
INTRAMUSCULAR | Status: AC
  Filled 2018-05-28: qty 30

## 2018-05-28 MED ORDER — ALBUMIN HUMAN 5 % IV SOLN
INTRAVENOUS | Status: AC
  Filled 2018-05-28: qty 250

## 2018-05-28 MED ORDER — BUPIVACAINE LIPOSOME 1.3 % IJ SUSP
INTRAMUSCULAR | Status: AC
  Filled 2018-05-28: qty 20

## 2018-05-28 SURGICAL SUPPLY — 93 items
"PENCIL ELECTRO HAND CTR " (MISCELLANEOUS) IMPLANT
ADH SKN CLS APL DERMABOND .7 (GAUZE/BANDAGES/DRESSINGS) ×2
APPLIER CLIP 11 MED OPEN (CLIP)
APPLIER CLIP 9.375 SM OPEN (CLIP)
APR CLP MED 11 20 MLT OPN (CLIP)
APR CLP SM 9.3 20 MLT OPN (CLIP)
BAG COUNTER SPONGE EZ (MISCELLANEOUS) ×1 IMPLANT
BAG DECANTER FOR FLEXI CONT (MISCELLANEOUS) ×2 IMPLANT
BAG SPNG 4X4 CLR HAZ (MISCELLANEOUS)
BALLN DORADO 7X40X80 (BALLOONS) ×2
BALLOON DORADO 7X40X80 (BALLOONS) IMPLANT
BLADE CLIPPER SURG (BLADE) ×1 IMPLANT
BLADE SURG 15 STRL LF DISP TIS (BLADE) ×1 IMPLANT
BLADE SURG 15 STRL SS (BLADE) ×4
BLADE SURG SZ11 CARB STEEL (BLADE) ×2 IMPLANT
BOOT SUTURE AID YELLOW STND (SUTURE) ×4 IMPLANT
BRUSH SCRUB EZ  4% CHG (MISCELLANEOUS) ×1
BRUSH SCRUB EZ 4% CHG (MISCELLANEOUS) ×1 IMPLANT
CANISTER SUCT 1200ML W/VALVE (MISCELLANEOUS) ×2 IMPLANT
CATH BEACON 5 .035 40 KMP TP (CATHETERS) IMPLANT
CATH BEACON 5 .038 40 KMP TP (CATHETERS) ×1
CHLORAPREP W/TINT 26ML (MISCELLANEOUS) ×2 IMPLANT
CLIP APPLIE 11 MED OPEN (CLIP) IMPLANT
CLIP APPLIE 9.375 SM OPEN (CLIP) IMPLANT
COVER WAND RF STERILE (DRAPES) ×2 IMPLANT
DERMABOND ADVANCED (GAUZE/BANDAGES/DRESSINGS) ×2
DERMABOND ADVANCED .7 DNX12 (GAUZE/BANDAGES/DRESSINGS) ×1 IMPLANT
DEVICE PRESTO INFLATION (MISCELLANEOUS) ×1 IMPLANT
DRAPE INCISE IOBAN 66X45 STRL (DRAPES) ×2 IMPLANT
DRAPE INCISE IOBAN 66X60 STRL (DRAPES) ×1 IMPLANT
DRESSING SURGICEL FIBRLLR 1X2 (HEMOSTASIS) ×1 IMPLANT
DRSG OPSITE POSTOP 4X8 (GAUZE/BANDAGES/DRESSINGS) ×2 IMPLANT
DRSG SURGICEL FIBRILLAR 1X2 (HEMOSTASIS) ×4
DURAPREP 26ML APPLICATOR (WOUND CARE) ×1 IMPLANT
ELECT CAUTERY BLADE 6.4 (BLADE) ×3 IMPLANT
ELECT REM PT RETURN 9FT ADLT (ELECTROSURGICAL) ×2
ELECTRODE REM PT RTRN 9FT ADLT (ELECTROSURGICAL) ×1 IMPLANT
GLOVE BIO SURGEON STRL SZ7 (GLOVE) ×7 IMPLANT
GLOVE INDICATOR 7.5 STRL GRN (GLOVE) ×6 IMPLANT
GLOVE SURG SYN 8.0 (GLOVE) ×4 IMPLANT
GLOVE SURG SYN 8.0 PF PI (GLOVE) ×1 IMPLANT
GOWN STRL REUS W/ TWL LRG LVL3 (GOWN DISPOSABLE) ×2 IMPLANT
GOWN STRL REUS W/ TWL XL LVL3 (GOWN DISPOSABLE) ×1 IMPLANT
GOWN STRL REUS W/TWL LRG LVL3 (GOWN DISPOSABLE) ×8
GOWN STRL REUS W/TWL XL LVL3 (GOWN DISPOSABLE) ×8
IV NS 500ML (IV SOLUTION) ×2
IV NS 500ML BAXH (IV SOLUTION) ×1 IMPLANT
KIT TURNOVER KIT A (KITS) ×2 IMPLANT
LABEL OR SOLS (LABEL) ×2 IMPLANT
LOOP RED MAXI  1X406MM (MISCELLANEOUS) ×5
LOOP VESSEL MAXI 1X406 RED (MISCELLANEOUS) ×2 IMPLANT
LOOP VESSEL MINI 0.8X406 BLUE (MISCELLANEOUS) ×2 IMPLANT
LOOPS BLUE MINI 0.8X406MM (MISCELLANEOUS) ×6
NDL HYPO 18GX1.5 BLUNT FILL (NEEDLE) ×1 IMPLANT
NEEDLE HYPO 18GX1.5 BLUNT FILL (NEEDLE) ×2 IMPLANT
NEEDLE HYPO 22GX1.5 SAFETY (NEEDLE) ×2 IMPLANT
NS IRRIG 500ML POUR BTL (IV SOLUTION) ×2 IMPLANT
PACK ANGIOGRAPHY (CUSTOM PROCEDURE TRAY) ×1 IMPLANT
PACK BASIN MAJOR ARMC (MISCELLANEOUS) ×2 IMPLANT
PACK UNIVERSAL (MISCELLANEOUS) ×2 IMPLANT
PATCH CAROTID ECM VASC 1X10 (Prosthesis & Implant Heart) ×2 IMPLANT
PENCIL ELECTRO HAND CTR (MISCELLANEOUS) ×1 IMPLANT
SHEATH BRITE TIP 7FRX11 (SHEATH) ×1 IMPLANT
SPONGE LAP 18X18 RF (DISPOSABLE) ×1 IMPLANT
STENT LIFESTREAM 8X58X80 (Permanent Stent) ×2 IMPLANT
STENT VIABAHN 8X50X120 (Permanent Stent) ×1 IMPLANT
STENT VIABAHN5X120X8X (Permanent Stent) ×1 IMPLANT
SUT MNCRL 4-0 (SUTURE) ×4
SUT MNCRL 4-0 27XMFL (SUTURE) ×2
SUT MNCRL+ 5-0 UNDYED PC-3 (SUTURE) ×1 IMPLANT
SUT MONOCRYL 5-0 (SUTURE)
SUT PROLENE 5 0 RB 1 DA (SUTURE) ×4 IMPLANT
SUT PROLENE 6 0 BV (SUTURE) ×19 IMPLANT
SUT SILK 2 0 (SUTURE) ×4
SUT SILK 2-0 18XBRD TIE 12 (SUTURE) ×1 IMPLANT
SUT SILK 3 0 (SUTURE) ×2
SUT SILK 3-0 18XBRD TIE 12 (SUTURE) ×1 IMPLANT
SUT SILK 4 0 (SUTURE) ×2
SUT SILK 4-0 18XBRD TIE 12 (SUTURE) ×1 IMPLANT
SUT VIC AB 0 CT1 36 (SUTURE) ×2 IMPLANT
SUT VIC AB 2-0 CT1 27 (SUTURE) ×4
SUT VIC AB 2-0 CT1 TAPERPNT 27 (SUTURE) ×2 IMPLANT
SUT VIC AB 3-0 SH 27 (SUTURE) ×2
SUT VIC AB 3-0 SH 27X BRD (SUTURE) ×1 IMPLANT
SUT VICRYL+ 3-0 36IN CT-1 (SUTURE) ×4 IMPLANT
SUTURE MNCRL 4-0 27XMF (SUTURE) IMPLANT
SYR 20CC LL (SYRINGE) ×4 IMPLANT
SYR 5ML LL (SYRINGE) ×2 IMPLANT
TOWEL OR 17X26 4PK STRL BLUE (TOWEL DISPOSABLE) ×1 IMPLANT
TRAY FOLEY MTR SLVR 16FR STAT (SET/KITS/TRAYS/PACK) ×2 IMPLANT
WIRE G 018X200 V18 (WIRE) ×1 IMPLANT
WIRE MAGIC TOR.035 180C (WIRE) ×1 IMPLANT
WIRE MAGIC TORQUE 260C (WIRE) ×1 IMPLANT

## 2018-05-28 NOTE — Op Note (Signed)
OPERATIVE NOTE   PROCEDURE: 1. Bilateral common femoral, superficial femoral and profunda femoris endarterectomy with Cormatrix patch angioplasty 2. Bilateral external iliac artery endarterectomy with via bond stent placement to tack down the leading edge of the plaque 3. Bilateral common iliac artery angioplasty and stent placement using the kissing balloon technique with Dr. Lucky Cowboy performing the stent from the right side and myself performing the left stent 4. Introduction catheter into aorta bilateral femoral approach with Dr. Lucky Cowboy performing the right side and myself performing the left side 5. Right iliofemoral femoral angiography performed by Dr. Lucky Cowboy 6. Left iliofemoral angiography performed by Dr. Delana Meyer  PRE-OPERATIVE DIAGNOSIS: Atherosclerotic occlusive disease bilateral lower extremities with lifestyle limiting claudication and severe rest pain symptoms; hypertension; coronary artery disease  POST-OPERATIVE DIAGNOSIS: Same  CO-SURGEON: Katha Cabal, MD and Algernon Huxley, M.D.  ASSISTANT(S): None  ANESTHESIA: general  ESTIMATED BLOOD LOSS: 700 cc  FINDING(S): 1. Profound calcific plaque noted bilaterally extending 3 to 4 cm proximally into the external iliac artery and past the femoral bifurcation of the profunda femoris arteries as well as down the extensive length of the SFA  SPECIMEN(S):  Calcific plaque from the external iliac, common femoral, superficial femoral and the profunda femoris arteries bilaterally  INDICATIONS:   Justin Mendez 84 y.o. y.o.male who presents with complaints of lifestyle limiting claudication and pain continuously in the feet bilaterally but he notes his right leg is significantly worse than his left. The patient has documented severe atherosclerotic occlusive disease and has undergone multiple minimally invasive treatments in the past. However, at this point his primary area of stricture stenosis resides in the common femoral and origins  of the superficial femoral and profunda femoris extending into these arteries and therefore this is not amenable to intervention and he is now undergoing open endarterectomy. The risks and benefits of been reviewed with the patient, all questions have answered; alternative therapies have been reviewed as well and the patient has agreed to proceed with surgical open repair.  DESCRIPTION: After obtaining full informed written consent, the patient was brought back to the operating room and placed supine upon the operating table.  The patient received IV antibiotics prior to induction.  After obtaining adequate anesthesia, the patient was prepped and draped in the standard fashion for: bilateral femoral exposure.  Co-surgeons are required because this is a bilateral procedure with work being performed simultaneously from both the right femoral and left femoral approach.  This also expedites the procedure making a shorter operative time reducing complications and improving patient safety.  Attention was turned to the bilateral groin with Dr. Lucky Cowboy working on the right and myself working on the left of the patient.  Vertical  incisions were made over the common femoral artery and dissected down to the common femoral artery with electrocautery.  I dissected out the common femoral artery from the distal external iliac artery (identified by the superficial circumflex vessels) down to the femoral bifurcation.  On initial inspection, the common femoral artery was: Densely calcified and there was no palpable pulse noted bilaterally.    Subsequently the dissection was continued to include all circumflex branches and the profunda femoral artery and superficial femoral artery. The superficial femoral artery was dissected circumferentially for a distance of approximately 3-4 cm and the profunda femoris was dissected circumferentially out to the fourth order branches individual vessel loops were placed around each branch. Both of  the groins were treated simultaneously as described above. Control of all branches  was obtained with vessel loops.    The proximal clamp site represented a unusual and particularly difficult problem bilaterally.  By palpation the extensive calcific plaque extended well up into the external iliac artery significantly past the bifurcation.  At the usual spot, at the level of the ileal inguinal ligament the external iliac artery was not clamped double.  Therefore the ileal inguinal ligament was required to be transected and retroperitoneal dissection was created along the anterior margin of the external iliac artery.  Once a distance of approximately 4 to 6 cm of iliac artery had been exposed the artery was dissected circumferentially.  Clamping of the external iliac artery occurred at about the midportion of the external iliac artery.  A softer area in the mid portion of the external iliac artery amendable to clamping was finally identified.  This extensive dissection was required both on the left and the right sides.    The patient was given 5000 units of Heparin intravenously, which was a therapeutic bolus later in the case and additional 2000 unit bolus was given.     Addressing the right side first and after completing the right side the left side was treated in the same fashion encountering the same difficulties and approaching the endarterectomy repair in the same sequence of events as described.  After waiting 3 minutes, the mid external iliac artery was clamped and placed all circumflex branches, and the profunda and superficial femoral arteries under tension.  Arteriotomy was made in the common femoral artery with a 11-blade and extended it with a Potts scissors, however, the calcific plaque was so hard and dense that Potts scissors were not adequate and ultimately the arteriotomy was completed using a 15 blade scalpel to transect the arterial wall and expose the plaque proximally and distally extending  the distal end down to the SFA and proximally up the external iliac artery for approximately 4 cm.  This was well beyond the circumflex branches noted on the angiogram.  Endarterectomy was then performed under direct visualization using a freer elevator and a right angle from the mid common femoral extending up both proximally and distally. Proximally the endarterectomy was brought up to the level of the clamp in the mid external iliac artery where an edge was obtained against the vascular clamp. Distally the endarterectomy was carried down to a soft spot in the SFA where a feathered edge would was obtained.  It should be noted that this portion of the case was performed by both Dr. Lucky Cowboy and I working in tandem on the right and left sides actively participating in the actual endarterectomy together.  The profunda femoris was treated with an eversion technique extending endarterectomy approximately 1 cm distally again obtaining a featheredge on the right.   At this point, we fashioned a core matrix patch for the geometry of the arteriotomy.  The patch was sewn to the artery with 2 running stitches of 6-0 Prolene, running from each end.  Prior to completing the patch angioplasty, the profunda femoral artery was flushed as was the superficial femoral artery. The system was then forward flushed. The endarterectomy site was then irrigated copiously with heparinized saline. The patch angioplasty was completed in the usual fashion.  Flow was then reestablished first to the profunda femoris and then the superficial femoral artery. Any gaps or bleeding sites in the suture line were easily controlled with a 6-0 Prolene suture.  Again I would point out that the sewing of the patch was performed by  both Dr. Lucky Cowboy and I on the right and left sides with each of [place] suturing fore-hand.  Doppler is then delivered onto the field and the SFA as well as the profunda femoris arteries were interrogated and found to have triphasic  Doppler signals.  As noted above once the right-sided can been completed this same technique was carried out on the left side in the fashion as described.  At this point with myself working on the left as previously noted and Dr. Lucky Cowboy working on the right Seldinger needles were used to access the midportion of the patch bilaterally.  J wires were then advanced under fluoroscopic guidance into the aorta and bilateral 7 French sheaths were inserted.  Kumpe catheter and Magic torque wires were then negotiated into the aorta and with the Kumpe catheters were removed and the Magic torque wires positioned so that the markers were in the visual field simultaneous injection of contrast through both sheaths was then performed.  This demonstrated greater than 90% stenosis on the left side and approximately 70% stenosis on the right side within the proximal and mid common iliac arteries.  Once appropriate measurements have been taken to 8 mm x 58 mm Lifestream stents were selected.  One stent was advanced up the right and one stent was advanced up the left and positioned crossing the iliac bifurcation in the standard kissing balloon technique.  Simultaneous inflation was then made to 14 atm for approximately 1 minute.  Follow-up imaging was then performed which demonstrated wide patency with less than 5% residual stenosis.  Then addressing the left side first more distal imaging of the common iliac and extending into the external iliac is obtained by myself using hand-injection and this shows that the leading edge of the plaque in the mid external iliac artery represents a greater than 50% residual stenosis.  Given the inability to reach this plaque surgically but the excellent result more distally the straightforward solution was to place a 8 mm x 50 mm via bond stent over this area.  Stent was deployed without difficulty and postdilated with a 7 mm Dorado balloon inflated to 14 atm for 1 minute.  Follow-up imaging now  demonstrated less than 5% residual stenosis with a smooth fluent transition.  Continuing with distal imaging by hand-injection the remaining portion of the femoral endarterectomy is widely patent.  There is less than 5% residual stenosis throughout the common femoral and both dominant branches of the profunda femoris.  The superficial femoral artery remains patent and similar to its preoperative plaque burden beyond the endarterectomy site.  This represents an excellent technical result with rapid flow of contrast into the deep femoral system.  Addressing the right side next first more distal imaging of the common iliac and extending into the external iliac is obtained by Dr. Lucky Cowboy using hand-injection and this shows that the leading edge of the plaque in the mid external iliac artery represents a greater than 50% residual stenosis.  Given the inability to reach this plaque surgically but the excellent result more distally the straightforward solution was to place a 8 mm x 50 mm via bond stent over this area.  Stent was deployed without difficulty and postdilated with a 7 mm Dorado balloon inflated to 14 atm for 1 minute.  Follow-up imaging by hand-injection demonstrates greater than 30% residual stenosis and therefore an 8 mm Dorado balloon was advanced across this area inflated to 14 atm for proximally 1 minute.  Follow-up imaging now demonstrated less than  5% residual stenosis with a smooth fluent transition.  Continuing with distal imaging by hand-injection the remaining portion of the femoral endarterectomy is widely patent.  There is less than 5% residual stenosis throughout the common femoral and both dominant branches of the profunda femoris.  The superficial femoral artery remains patent and similar to its preoperative plaque burden beyond the endarterectomy site.  This represents an excellent technical result with rapid flow of contrast into the deep femoral system.  Both right and left groins were then  irrigated copiously with sterile saline and subsequently Evicel and Surgicel were placed in the wound.  Exparel with half percent Marcaine was then infiltrated into both incisions.  The incisions was repaired with a double layer of 2-0 Vicryl, a double layer of 3-0 Vicryl, and a layer of 4-0 Monocryl in a subcuticular fashion.  Again Dr. Lucky Cowboy closing the right side and myself closing the left side. The skin was cleaned, dried, and reinforced with Dermabond.  COMPLICATIONS: None  CONDITION: Carlynn Purl, M.D. [facility] Office: [phone]  06/09/2018, 4:58 PM

## 2018-05-28 NOTE — Progress Notes (Signed)
PHARMACIST - PHYSICIAN ORDER COMMUNICATION  CONCERNING: P&T Medication Policy on Herbal Medications  DESCRIPTION:  This patient's order for:  Coenzyme Q10 10 mg    has been noted.  This product(s) is classified as an "herbal" or natural product. Due to a lack of definitive safety studies or FDA approval, nonstandard manufacturing practices, plus the potential risk of unknown drug-drug interactions while on inpatient medications, the Pharmacy and Therapeutics Committee does not permit the use of "herbal" or natural products of this type within [facility].   ACTION TAKEN: The pharmacy department is unable to verify this order at this time.  Please reevaluate patient's clinical condition at discharge and address if the herbal or natural product(s) should be resumed at that time.  Gardner CandleSheema M Gracia Saggese, PharmD, BCPS Clinical Pharmacist 11-30-17 5:43 PM

## 2018-05-28 NOTE — Op Note (Addendum)
OPERATIVE NOTE   PROCEDURE: 1. Bilateral common femoral, profunda femoris, and superficial femoral artery endarterectomies and patch angioplasty 2.   Bilateral external iliac endarterectomy and patch angioplasty 3.   Catheter placement into the aorta from bilateral femoral approaches 4.   Aortogram and bilateral iliofemoral arteriogram 5.   Kissing balloon stent placement to the aortic bifurcation and proximal bilateral common iliac arteries with Dr. Delana Meyer performing the left and myself performing the right using 8 mm diameter by 58 mm length lifestream stents 6.   Bilateral external iliac artery stent placement with 8 mm diameter by 5 cm length Viabahn stent for stenosis of greater than 50% just proximal to the proximal endpoint of the endarterectomy higher than could be reached surgically, the left performed by Dr. Delana Meyer and the right performed by me.    PRE-OPERATIVE DIAGNOSIS: 1.Atherosclerotic occlusive disease bilateral lower extremities with rest pain   POST-OPERATIVE DIAGNOSIS: Same  SURGEON: Leotis Pain, MD   Co-SURGEON: Hortencia Pilar, MD  ANESTHESIA: general  ESTIMATED BLOOD LOSS: 700 cc  FINDING(S): 1. significant plaque in both external iliac, common femoral, profunda femoris, and superficial femoral arteries  SPECIMEN(S): Bilateral external iliac, common femoral, profunda femoris, and superficial femoral artery plaque.  INDICATIONS:  Patient presents with atherosclerotic occlusive disease with rest pain of both lower extremities.  He has significant aortoiliac disease within previously placed stents in the common iliac arteries as well as occlusion of the femoral arteries including the bifurcations of the femoral arteries that extend up into the external iliac arteries.  Bilateral femoral endarterectomies with aortoiliac intervention concomitant is plan to improve his perfusion.  I worked on the right side while Dr. Delana Meyer worked on the left side to perform  the procedures concomitantly in the patient's best interest.  The risks and benefits as well as alternative therapies including intervention were reviewed in detail all questions were answered the patient agrees to proceed with surgery.  Co-surgeons are required due to the bilateral nature of the disease as well as the complex nature of this disease process.  DESCRIPTION: After obtaining full informed written consent, the patient was brought back to the operating room and placed supine upon the operating table. The patient received IV antibiotics prior to induction. After obtaining adequate anesthesia, the patient was prepped and draped in the standard fashion appropriate time out is called. Co-surgeons are required due to the bilateral nature of the disease as well as the complex nature of the disease.  Vertical incision was created overlying the right femoral arteries. The common femoral artery proximally, and superficial femoral artery, and primary profunda femoris artery branches were encircled with vessel loops and prepared for control.  There were 4 branches of the profunda femoris artery that were encircled separately and the main profunda femoris was dissected out several centimeters beyond its origin.  The plaque in the common femoral artery was extensive and extended well proximal and well up into the external iliac artery which was somewhat expected from the imaging.  We dissected about 4 cm above the circumflex vessels as high as we could possibly get after dividing the inguinal ligament and exposing the external iliac artery.  The right femoral arteries and external iliac artery were found to have significant plaque from the external iliac artery into the profunda and superficial femoral arteries. Similarly, a vertical incision was creating overlying the left femoral artery.  There was extensive dense plaque throughout the common femoral artery, external iliac artery, and the proximal portions  of the  superficial femoral and profunda femoris arteries.  The dissection was carried up approximately 3 cm above the circumflex vessels as high as we could get on the left side after dividing the inguinal ligament and exposing the external iliac artery.  There was still plaque at the leading edge of this endarterectomy site, but this was as high as could be achieved surgically through a femoral incision.  The Vesseloops were prepared for control of the SFA and the primary profunda femoris artery branches as well as a large posterior branch in the mid common femoral artery.  5000 units of heparin was given and allowed circulate for 5 minutes. An additional 2000 units were given later in the procedure.  Attention is then turned to the right femoral artery. An arteriotomy is made with 11 blade and extended with Potts scissors in the common femoral artery and carried down onto the first 1-2 cm of the superficial femoral artery.  The arteriotomy was extended proximally well above the circumflex vessels up into the external iliac artery.  An endarterectomy was then performed. The [facility] was used to create a plane. The proximal endpoint was cut flush with tenotomy scissors and then a chunk of plaque was pulled with a hemostat well up into the external iliac artery.  An eversion endarterectomy was then performed for the first 2-3 cm of the profunda femorus. Good backbleeding was then seen. The distal endpoint of the superficial femoral endarterectomy was created with gentle traction. The Cormatrix patcth is then selected and prepared for a patch angioplasty.  It is cut and beveled and started at the proximal endpoint with a 6-0 Prolene suture in the external iliac artery.  Approximately one half of the suture line is run medially and laterally and the distal end point was cut and bevelled to match the arteriotomy.  Dr. Delana Meyer and myself performed approximately half of the arteriotomy closure each.  A  second 6-0 Prolene was started at the distal end point in the proximal superficial femoral artery and run to the mid portion to complete the arteriotomy.  The vessel was flushed prior to release of control and completion of the anastomosis.  At this point, flow was established first to the profunda femoris artery and then to the superficial femoral artery. Easily palpable pulses are noted well beyond the anastomosis in both arteries. Attention is then turned to the left femoral artery.  An arteriotomy was made with an 11 blade and extended with Potts scissors and the common femoral artery and carried down about 2 cm onto the left superficial femoral artery.  It was extended proximally 2 to 3 cm above the circumflex vessels up into the external iliac artery.  The endarterectomy was then performed with a Garment/textile technologist used to create a plane.  The proximal endpoint was taken as high as possible using a hemostat to pull out a chunk of plaque up to the clamp.  This was well into the external iliac artery above the circumflex vessels.  An eversion endarterectomy was performed of the first 2 cm or so of the primary profunda femoris artery with a nice distal endpoint seen in good backbleeding.  The distal endpoint of the endarterectomy was about 2 cm into the superficial femoral artery and a large chunk of plaque was pulled out with a hemostat with backbleeding seen.  The core matrix patch is then selected and prepared for patch angioplasty.  It is cut and beveled started at the proximal endpoint and the external iliac  artery with 6-0 Prolene.  One half the suture line is run medially and laterally with Dr. Delana Meyer and myself forming approximately half of the arteriotomy.  The distal endpoint is then cut with tenotomy scissors and started in the proximal SFA.  The suture lines were then run together.  The vessels were flushed and de-aired prior to release of control and palpable pulses are noted beyond the anastomosis  on release of control.  7 [place] sheaths were then placed bilaterally through the core matrix patches.  I worked on the right while Dr. Delana Meyer worked on the left.  Using a Kumpe catheter and a Magic torque wire we both advanced up into the aorta and remove the catheters.  Imaging through the femoral sheath showed high-grade disease in the left common iliac artery stent about a centimeter from its origin.  The right sided disease was more moderate, but both sides would need to be treated in a kissing balloon fashion and we elected to place covered stents in these locations.  8 mm diameter by 58 mm length lifestream stents were then deployed from the distal aorta across the common iliac artery lesions and down of the distal common iliac arteries bilaterally in a kissing balloon fashion.  Both balloons were inflated up to 14 atm.  Completion imaging showed less than 10% residual stenosis in the common iliac arteries after stent placement.  Dr. Delana Meyer then performed imaging through the left femoral sheath and evaluated the external iliac artery.  Above the patch, there remained a greater than 50% residual stenosis in the external iliac artery that was higher than could be treated surgically.  An 8 mm diameter by 5 cm length Viabahn stent was then deployed over a V 18 wire and postdilated with a 7 mm balloon with excellent angiographic completion result in about 10 to 15% residual stenosis.  Femoral angiography was performed showing the profunda femoris artery to be widely patent and the patch angioplasty to be widely patent.  The proximal superficial femoral artery is patent although we did know there was some disease distally.  I then performed imaging through the right femoral sheath and similarly, there was a greater than 50% residual stenosis in the external iliac artery just above the endarterectomy site that was higher than could be treated surgically.  Again, we exchanged for a V 18 wire and an 8 mm diameter  by 5 cm length Viabahn stent was deployed in the right external iliac artery just down to the proximal endpoint of the patch.  This was postdilated with a 7 and an 8 mm balloon with excellent angiographic completion result in about 20% residual stenosis.  Femoral imaging showed the profunda femoris artery to be widely patent after endarterectomy as was the common femoral artery with the widely patent patch angioplasty.  The proximal superficial femoral artery just distal to the patch remained diseased, and we knew there was further disease distally.  He did not have ulcerations on that side, and his SFA disease would not be addressed at this time.  Surgicel and Evicel topical hemostatic agents were placed in the femoral incision and hemostasis was complete. The femoral incision was then closed in a layered fashion with 2 layers of 2-0 Vicryl, 2 layers of 3-0 Vicryl, and 4-0 Monocryl for the skin closure. Dermabond and sterile dressing were then placed over the incision.  The patient was then awakened from anesthesia and taken to the recovery room in stable condition having tolerated the procedure well.  COMPLICATIONS: None  CONDITION: Stable     Leotis Pain 06/11/2018 3:13 PM   This note was created with Dragon Medical transcription system. Any errors in dictation are purely unintentional.

## 2018-05-28 NOTE — Anesthesia Postprocedure Evaluation (Signed)
Anesthesia Post Note  Patient: Justin BrownMichael Mendez  Procedure(s) Performed: ENDARTERECTOMY FEMORAL (Bilateral ) INSERTION OF ILIAC STENT (Bilateral )  Patient location during evaluation: PACU Anesthesia Type: General Level of consciousness: awake and alert Pain management: pain level controlled Vital Signs Assessment: post-procedure vital signs reviewed and stable Respiratory status: spontaneous breathing, nonlabored ventilation, respiratory function stable and patient connected to nasal cannula oxygen Cardiovascular status: blood pressure returned to baseline and stable Postop Assessment: no apparent nausea or vomiting Anesthetic complications: no     Last Vitals:  Vitals:   05/31/2018 1524 05/16/2018 1625  BP:  123/66  Pulse: 60   Resp: 15   Temp:    SpO2: 96%     Last Pain:  Vitals:   06/08/2018 1625  TempSrc:   PainSc: Asleep                 Naasia Weilbacher S

## 2018-05-28 NOTE — Progress Notes (Signed)
Night of surgery note:  S: Patient is awake and answering questions appropriately.  He states his feet do not hurt which he is very happy about.  O: Vital signs blood pressure 121/55 with a heart rate of 63.  He is on 2 L nasal cannula with O2 saturations of 96%.  He rates his pain as a 0 out of 10.   Both groins are clean dry and intact with a small amount of ecchymoses but no significant swelling.  Both feet are warm with brisk capillary refill and biphasic to triphasic Doppler signals.  Postoperative hemoglobin is 11.0 electrolytes are within normal limits  A: Status post bilateral femoral endarterectomies with external iliac endarterectomies, bilateral common iliac artery stenting with stenting of the external iliac artery as well.  Procedure performed for atherosclerotic occlusive disease with rest pain  P: Continue care as planned.  We will maintain IV fluids.  Pain control is excellent at this time.  Continue O2 by nasal cannula we will plan to DC the Foley in the a.m.

## 2018-05-28 NOTE — H&P (Signed)
[facility] History & Physical Update  The patient was interviewed and re-examined.  The patient's previous History and Physical has been reviewed and is unchanged.  There is no change in the plan of care. We plan to proceed with the scheduled procedure.  Justin DredgeGregory Schnier, MD  Oct 01, 2017, 7:30 AM

## 2018-05-28 NOTE — Progress Notes (Signed)
Paged Dr Lorretta HarpSchneir, Tech in vascular returned page on behalf Dr. Lorretta HarpSchneir.  Informed pt c/o pain in left arm from saline locked IV site to shoulder.  Elevated arm on pillow, LUE warm, capillary refill within 3 seconds

## 2018-05-28 NOTE — Anesthesia Post-op Follow-up Note (Signed)
Anesthesia QCDR form completed.        

## 2018-05-28 NOTE — Transfer of Care (Signed)
Immediate Anesthesia Transfer of Care Note  Patient: Justin Mendez  Procedure(s) Performed: ENDARTERECTOMY FEMORAL (Bilateral ) INSERTION OF ILIAC STENT (Bilateral )  Patient Location: PACU  Anesthesia Type:General  Level of Consciousness: drowsy  Airway & Oxygen Therapy: Patient Spontanous Breathing and Patient connected to face mask oxygen  Post-op Assessment: Report given to RN and Post -op Vital signs reviewed and stable  Post vital signs: Reviewed and stable  Last Vitals:  Vitals Value Taken Time  BP 80/53 10-02-17  1:38 PM  Temp    Pulse 60 10-02-17  1:38 PM  Resp 13 10-02-17  1:38 PM  SpO2 98 % 10-02-17  1:38 PM  Vitals shown include unvalidated device data.  Last Pain:  Vitals:   November 02, 2017 0613  TempSrc: Tympanic  PainSc: 0-No pain         Complications: No apparent anesthesia complications

## 2018-05-28 NOTE — Anesthesia Procedure Notes (Addendum)
Arterial Line Insertion Start/End11/03/2018 8:05 AM, 05/28/2018 8:15 AM Performed by: Berdine Addisonhomas, Mathai, MD, Karoline CaldwellStarr, Josiel Gahm, CRNA, CRNA  Patient location: OR. Preanesthetic checklist: patient identified, IV checked, site marked, risks and benefits discussed, surgical consent, monitors and equipment checked, pre-op evaluation, timeout performed and anesthesia consent Lidocaine 1% used for infiltration Left, radial was placed Catheter size: 20 Fr Hand hygiene performed   Attempts: 1 Procedure performed without using ultrasound guided technique. Following insertion, dressing applied. Post procedure assessment: normal and unchanged  Patient tolerated the procedure well with no immediate complications.

## 2018-05-28 NOTE — Anesthesia Preprocedure Evaluation (Addendum)
Anesthesia Evaluation  Patient identified by MRN, date of birth, ID band Patient awake    Reviewed: Allergy & Precautions, NPO status , Patient's Chart, lab work & pertinent test results, reviewed documented beta blocker date and time   Airway Mallampati: III  TM Distance: >3 FB     Dental  (+) Chipped, Upper Dentures   Pulmonary sleep apnea , former smoker,           Cardiovascular hypertension, Pt. on medications and Pt. on home beta blockers + CAD, + Peripheral Vascular Disease and +CHF  + dysrhythmias Atrial Fibrillation + pacemaker      Neuro/Psych  Neuromuscular disease    GI/Hepatic GERD  Controlled,  Endo/Other    Renal/GU      Musculoskeletal  (+) Arthritis ,   Abdominal   Peds  Hematology   Anesthesia Other Findings Neck movement ok. EF 30-35 1 yr ago.  Reproductive/Obstetrics                            Anesthesia Physical Anesthesia Plan  ASA: III  Anesthesia Plan: General   Post-op Pain Management:    Induction: Intravenous  PONV Risk Score and Plan:   Airway Management Planned: Oral ETT  Additional Equipment:   Intra-op Plan:   Post-operative Plan:   Informed Consent: I have reviewed the patients History and Physical, chart, labs and discussed the procedure including the risks, benefits and alternatives for the proposed anesthesia with the patient or authorized representative who has indicated his/her understanding and acceptance.     Plan Discussed with: CRNA  Anesthesia Plan Comments:         Anesthesia Quick Evaluation

## 2018-05-28 NOTE — Progress Notes (Addendum)
Called to bedside by nursing as pt had reported brief episode of chest pain and pain in his jaw like "tooth pain".  Upon arrival to bedside, pt is awake and alert, in no acute distress, slightly hypoxic (O2 sats 86%).  Pt now denies chest pain or shortness of breath, reports that he is "feeling good" and  only "feels congested in his chest".  Normal work of breathing, coarse lungs sounds bilaterally.  Orders placed for STAT EKG and Troponin, prn Nitroglycerin, prn morphine.  STAT CXR and BNP ordered.  Will KVO fluids for now until CXR and BNP resulted.  Orders placed for Mucinex, Incentive spirometry, and Chest PT.   Nasal cannula increased from 2L to 4L, O2 sats now have increased to  97%.  Continue to monitor.    Justin Mendez, AGACNP-BC [facility] Pager: [phone] Cell: [phone]

## 2018-05-28 NOTE — Anesthesia Procedure Notes (Addendum)
Procedure Name: Intubation Date/Time: 05/18/2018 8:00 AM Performed by: Jacqualine Mau, RN Pre-anesthesia Checklist: Patient identified, Patient being monitored, Timeout performed, Emergency Drugs available and Suction available Patient Re-evaluated:Patient Re-evaluated prior to induction Oxygen Delivery Method: Circle system utilized Preoxygenation: Pre-oxygenation with 100% oxygen Induction Type: IV induction Ventilation: Mask ventilation without difficulty Laryngoscope Size: Mac and 4 Grade View: Grade II Tube type: Oral Tube size: 7.5 mm Number of attempts: 1 Airway Equipment and Method: Stylet Placement Confirmation: ETT inserted through vocal cords under direct vision,  positive ETCO2 and breath sounds checked- equal and bilateral Secured at: 22 cm Tube secured with: Tape Dental Injury: Teeth and Oropharynx as per pre-operative assessment

## 2018-05-28 NOTE — Consult Note (Addendum)
Name: Justin Mendez MRN: [id number] DOB: 05/22/1933    ADMISSION DATE:  05/31/2018 CONSULTATION DATE: 05/24/2018  REFERRING MD : Dr. Gilda Crease   CHIEF COMPLAINT: Bilateral lower extremity rest pain   BRIEF PATIENT DESCRIPTION:  84 yo male admitted s/p bilateral femoral endarterectomies with external iliac endarterectomies, bilateral common iliac artery stenting with stenting of the external iliac artery  SIGNIFICANT EVENTS  11/13-Pt underwent bilateral common femoral, superficial femoral and profunda femoris  endarterectomy with Cormatrix patch angioplasty. Bilateral external iliac artery endarterectomy with via bond stent placement to tack down the leading edge of the plaque. Bilateral common iliac artery angioplasty and stent placement using the kissing balloon technique with Dr. Wyn Quaker performing the stent from the right side and myself performing the left stent. Introduction catheter into aorta bilateral femoral approach with Dr. Wyn Quaker performing the right side and myself performing the left side. Right iliofemoral femoral angiography performed by Dr. Wyn Quaker. Left iliofemoral angiography  HISTORY OF PRESENT ILLNESS:   This is a 84 yo male with a PMH of OSA, Permanent Cardiac Pacemaker, PVD, Paroxysmal Atrial Fibrillation, PAD, Neuropathy, Ischemic Cardiomyopathy, HTN, Hypercholesteremia, GERD, CAD, Osteoarthritis, Aortic Stenosis.  He has a history of severe atherosclerosis of bilateral lower extremities associated with lifestyle limiting claudication and rest pain.  He presented to [facility] on 11/13 for bilateral femoral endarterectomies with external iliac endarterectomies, bilateral common iliac artery stenting with stenting of the external iliac artery.  He was admitted to ICU postop for additional workup and treatment   PAST MEDICAL HISTORY :   has a past medical history of Aortic stenosis, Arthritis, Coronary artery disease, GERD (gastroesophageal reflux disease), H/O carotid endarterectomy,  Hearing loss, High cholesterol, Hypertension, Ischemic cardiomyopathy, Neuropathy, PAD (peripheral artery disease) (HCC), Paroxysmal atrial fibrillation (HCC), Peripheral vascular disease (HCC), Presence of permanent cardiac pacemaker (01/03/2018), and Sleep apnea.  has a past surgical history that includes Knee surgery; Neck surgery; carotid endarderectomy (Bilateral, 11/2001); RIGHT/LEFT HEART CATH AND CORONARY ANGIOGRAPHY (N/A, 04/29/2017); Pacemaker insertion (Left, 01/03/2018); Lower Extremity Angiography (Right, 04/29/2018); Replacement total knee bilateral (Bilateral); Insert / replace / remove pacemaker; Joint replacement; Cardiac catheterization (05/07/2017); rectal fistula surgery; and Mandible surgery. Prior to Admission medications   Medication Sig Start Date End Date Taking? Authorizing Provider  atorvastatin (LIPITOR) 80 MG tablet Take 40 mg by mouth at bedtime.  05/04/17  Yes [provider]  clopidogrel (PLAVIX) 75 MG tablet Take 75 mg by mouth daily.  05/04/17  Yes [provider]  clotrimazole-betamethasone (LOTRISONE) cream Apply 2 application topically as needed (itching).  10/30/17  Yes [provider]  COENZYME Q10 PO Take 10 mg by mouth daily.   Yes [provider]  ELIQUIS 5 MG TABS tablet Take 1 tablet (5 mg total) by mouth 2 (two) times daily. 01/06/18  Yes Wieting, Richard, MD  escitalopram (LEXAPRO) 10 MG tablet Take 10 mg by mouth daily.  03/05/18  Yes [provider]  fluticasone (FLONASE) 50 MCG/ACT nasal spray Place 2 sprays into both nostrils daily as needed (congestion).  05/04/17  Yes [provider]  ipratropium (ATROVENT) 0.06 % nasal spray Place 2 sprays into the nose daily.  08/30/17 08/30/18 Yes [provider]  isosorbide mononitrate (IMDUR) 30 MG 24 hr tablet Take 30 mg by mouth daily.  05/04/17  Yes [provider]  magnesium oxide (MAG-OX) 400 MG tablet Take 400 mg by mouth daily.   Yes  [provider]  metoprolol succinate (TOPROL-XL) 50 MG 24 hr tablet Take 1 tablet (  50 mg total) by mouth 2 (two) times daily. Take with or immediately following a meal. 01/04/18  Yes Wieting, Richard, MD  Multiple Vitamin (MULTI-VITAMINS) TABS Take 1 tablet by mouth daily.    Yes [provider]  olmesartan (BENICAR) 20 MG tablet Take 10 mg by mouth every evening.  01/22/18  Yes [provider]  pantoprazole (PROTONIX) 40 MG tablet Take 40 mg by mouth daily. 05/04/17  Yes [provider]  spironolactone (ALDACTONE) 25 MG tablet Take 12.5 mg by mouth daily.  05/04/17  Yes [provider]  nitroGLYCERIN (NITROSTAT) 0.4 MG SL tablet Place 0.4 mg under the tongue every 5 (five) minutes as needed for chest pain.    [provider]   Allergies  Allergen Reactions  . Iodinated Diagnostic Agents Other (See Comments)     Was very "flushed", denies itching, swelling or hives. He states this was over 30 years ago with Ionic contrast and not the current non-ionic contrast. SPM    FAMILY HISTORY:  family history includes COPD in his sister; Lung cancer in his father. SOCIAL HISTORY:  reports that he quit smoking about 33 years ago. His smoking use included cigarettes. He has never used smokeless tobacco. He reports that he drank alcohol. He reports that he does not use drugs.  REVIEW OF SYSTEMS: Positives in BOLD  Constitutional: Negative for fever, chills, weight loss, malaise/fatigue and diaphoresis.  HENT: Negative for hearing loss, ear pain, nosebleeds, congestion, sore throat, neck pain, tinnitus and ear discharge.   Eyes: Negative for blurred vision, double vision, photophobia, pain, discharge and redness.  Respiratory: Negative for cough, hemoptysis, sputum production, shortness of breath, wheezing and stridor.   Cardiovascular: Negative for chest pain, palpitations, orthopnea, claudication, leg swelling and PND.  Gastrointestinal: Negative for  heartburn, nausea, vomiting, abdominal pain, diarrhea, constipation, blood in stool and melena.  Genitourinary: Negative for dysuria, urgency, frequency, hematuria and flank pain.  Musculoskeletal: Negative for myalgias, back pain, joint pain and falls.  Skin: bilateral groin pain at incision sites, itching and rash.  Neurological: Negative for dizziness, tingling, tremors, sensory change, speech change, focal weakness, seizures, loss of consciousness, weakness and headaches.  Endo/Heme/Allergies: Negative for environmental allergies and polydipsia. Does not bruise/bleed easily.  SUBJECTIVE:  Pt c/o mild bilateral groin pain at incision sites  VITAL SIGNS: Temp:  [97.8 F (36.6 C)-98 F (36.7 C)] 97.9 F (36.6 C) (11/13 1717) Pulse Rate:  [59-88] 60 (11/13 1524) Resp:  [10-19] 15 (11/13 1524) BP: (112-125)/(59-82) 123/66 (11/13 1625) SpO2:  [90 %-98 %] 96 % (11/13 1524) Arterial Line BP: (121-144)/(48-56) 125/48 (11/13 1524) Weight:  [97.9 kg] 97.9 kg (11/13 0613)  PHYSICAL EXAMINATION: General: well developed, well nourished male, NAD  Neuro: alert and oriented, follows commands HEENT: supple, no JVD  Cardiovascular: dual paced, no R/G Lungs: clear throughout, even, non labored  Abdomen: +BS x4, soft, non tender, non distended  Musculoskeletal: normal bulk and tone, no edema  Skin: bilateral groin incision sites honeycomb dressings intact no hematoma or bleeding, right groin ecchymosis present, bilateral lower extremities warm   Recent Labs  Lab 05/26/18 1509 05/20/2018 1423  NA 139 143  K 4.2 4.6  CL 105 111  CO2 26 22  BUN 19 20  CREATININE 1.28* 1.30*  GLUCOSE 96 135*   Recent Labs  Lab 05/26/18 1509 05/22/2018 1423  HGB 13.7 11.0*  HCT 40.0 33.5*  WBC 8.3 10.9*  PLT 217 189   Dg C-arm 1-60 Min  Result Date:  June 30, 2018 CLINICAL DATA:  84 year old male with peripheral arterial disease undergoing arteriography and intervention EXAM: DG C-ARM 61-120 MIN  COMPARISON:  None. FINDINGS: A total of 14 intraoperative saved images are submitted for review. The images document balloon angioplasty of kissing common iliac artery stents, placement of a left external iliac artery stent and placement of a right external iliac artery stent. Of note, on the right to the superficial femoral artery is severely diseased and nearly occluded. IMPRESSION: Placement of kissing iliac stents and bilateral external iliac stents. The right superficial femoral artery is heavily diseased and nearly occluded. Please see operative note for full detail. Electronically Signed   By: Malachy MoanHeath  McCullough M.D.   On: June 30, 2018 12:59    ASSESSMENT / PLAN: Severe atherosclerosis of bilateral lower extremities with claudication and rest pain s/p bilateral femoral endarterectomies with external iliac endarterectomies, bilateral common iliac artery stenting with stenting of the external iliac artery-01-Jun-2018 Hx: CAD, HTN, Ischemic Cardiomyopathy, Hypercholesteremia, Paroxysmal Atrial Fibrillation, Permanent Pacemaker, and Aortic Stenosis Continuous telemetry monitoring  Continue outpatient cardiac medications Maintain map >65 VTE px: subq heparin  Trend CBC  Monitor for s/sx of bleeding and transfuse for hgb <7 Continue empiric cefazolin  Trend WBC and monitor fever curve    Obstructive Sleep Apnea Supplemental O2 for dyspnea and/or hypoxia Pt refusing CPAP   GERD Continue protonix   Postop pain Continue prn morphine and percocet for pain management   Physical therapy consulted   Sonda Rumbleana Arohi Salvatierra, AGNP  Pulmonary/Critical Care Pager [phone] (please enter [phone]) PCCM Consult Pager [phone] (please enter 7 digits)

## 2018-05-29 ENCOUNTER — Encounter: Payer: Self-pay | Admitting: Vascular Surgery

## 2018-05-29 DIAGNOSIS — I70223 Atherosclerosis of native arteries of extremities with rest pain, bilateral legs: Principal | ICD-10-CM

## 2018-05-29 LAB — CBC
HCT: 32.6 % — ABNORMAL LOW (ref 39.0–52.0)
Hemoglobin: 11.1 g/dL — ABNORMAL LOW (ref 13.0–17.0)
MCH: 31.5 pg (ref 26.0–34.0)
MCHC: 34 g/dL (ref 30.0–36.0)
MCV: 92.6 fL (ref 80.0–100.0)
NRBC: 0 % (ref 0.0–0.2)
Platelets: 213 10*3/uL (ref 150–400)
RBC: 3.52 MIL/uL — ABNORMAL LOW (ref 4.22–5.81)
RDW: 12.6 % (ref 11.5–15.5)
WBC: 21.3 10*3/uL — AB (ref 4.0–10.5)

## 2018-05-29 LAB — BASIC METABOLIC PANEL
ANION GAP: 9 (ref 5–15)
Anion gap: 6 (ref 5–15)
BUN: 22 mg/dL (ref 8–23)
BUN: 23 mg/dL (ref 8–23)
CALCIUM: 8.8 mg/dL — AB (ref 8.9–10.3)
CALCIUM: 9 mg/dL (ref 8.9–10.3)
CO2: 25 mmol/L (ref 22–32)
CO2: 23 mmol/L (ref 22–32)
CREATININE: 1.17 mg/dL (ref 0.61–1.24)
Chloride: 108 mmol/L (ref 98–111)
Chloride: 110 mmol/L (ref 98–111)
Creatinine, Ser: 1.24 mg/dL (ref 0.61–1.24)
GFR calc non Af Amer: 52 mL/min — ABNORMAL LOW (ref >60)
GFR, EST NON AFRICAN AMERICAN: 56 mL/min — AB (ref >60)
GLUCOSE: 144 mg/dL — AB (ref 70–99)
Glucose, Bld: 136 mg/dL — ABNORMAL HIGH (ref 70–99)
Potassium: 4.4 mmol/L (ref 3.5–5.1)
Potassium: 4.6 mmol/L (ref 3.5–5.1)
SODIUM: 141 mmol/L (ref 135–145)
Sodium: 140 mmol/L (ref 135–145)

## 2018-05-29 LAB — HEMATOCRIT: HEMATOCRIT: 31.3 % — AB (ref 39.0–52.0)

## 2018-05-29 LAB — PROCALCITONIN

## 2018-05-29 LAB — TROPONIN I
Troponin I: 0.04 ng/mL
Troponin I: 0.04 ng/mL
Troponin I: 0.04 ng/mL (ref <0.03)
Troponin I: 0.45 ng/mL (ref <0.03)

## 2018-05-29 LAB — BRAIN NATRIURETIC PEPTIDE: B NATRIURETIC PEPTIDE 5: 572 pg/mL — AB (ref 0.0–100.0)

## 2018-05-29 MED ORDER — HEPARIN (PORCINE) 25000 UT/250ML-% IV SOLN
950.0000 [IU]/h | INTRAVENOUS | Status: DC
  Administered 2018-05-29 – 2018-05-30 (×2): 1250 [IU]/h via INTRAVENOUS
  Administered 2018-05-31: 1150 [IU]/h via INTRAVENOUS
  Filled 2018-05-29 (×3): qty 250

## 2018-05-29 MED ORDER — PHENYLEPHRINE HCL-NACL 10-0.9 MG/250ML-% IV SOLN
0.0000 ug/min | INTRAVENOUS | Status: DC
  Administered 2018-05-29: 20 ug/min via INTRAVENOUS
  Administered 2018-05-30: 15 ug/min via INTRAVENOUS
  Administered 2018-05-31: 20 ug/min via INTRAVENOUS
  Administered 2018-05-31: 30 ug/min via INTRAVENOUS
  Administered 2018-05-31 (×2): 40 ug/min via INTRAVENOUS
  Filled 2018-05-29 (×6): qty 250

## 2018-05-29 MED ORDER — SODIUM CHLORIDE 0.9 % IV SOLN
INTRAVENOUS | Status: DC
  Administered 2018-05-29: 01:00:00 via INTRAVENOUS

## 2018-05-29 MED ORDER — ALBUMIN HUMAN 25 % IV SOLN
12.5000 g | Freq: Once | INTRAVENOUS | Status: AC
  Administered 2018-05-29: 12.5 g via INTRAVENOUS
  Filled 2018-05-29 (×2): qty 50

## 2018-05-29 MED ORDER — POLYVINYL ALCOHOL 1.4 % OP SOLN
1.0000 [drp] | Freq: Four times a day (QID) | OPHTHALMIC | Status: DC | PRN
  Filled 2018-05-29 (×2): qty 15

## 2018-05-29 MED ORDER — ASPIRIN 325 MG PO TABS
325.0000 mg | ORAL_TABLET | Freq: Every day | ORAL | Status: DC
  Administered 2018-05-29: 325 mg via ORAL
  Filled 2018-05-29: qty 1

## 2018-05-29 MED ORDER — APIXABAN 5 MG PO TABS: 5.0000 mg | ORAL_TABLET | Freq: Two times a day (BID) | ORAL | Status: DC

## 2018-05-29 MED ORDER — MORPHINE SULFATE (PF) 2 MG/ML IV SOLN
1.0000 mg | INTRAVENOUS | Status: DC | PRN
  Administered 2018-05-29 – 2018-05-31 (×2): 1 mg via INTRAVENOUS
  Filled 2018-05-29 (×3): qty 1

## 2018-05-29 NOTE — Progress Notes (Signed)
ANTICOAGULATION CONSULT NOTE - Initial Consult  Pharmacy Consult for Heparin drip Indication: chest pain/ACS  Allergies  Allergen Reactions  . Iodinated Diagnostic Agents Other (See Comments)     Was very "flushed", denies itching, swelling or hives. He states this was over 30 years ago with Ionic contrast and not the current non-ionic contrast. SPM    Patient Measurements: Height: 6' (182.9 cm) Weight: 211 lb 3.2 oz (95.8 kg) IBW/kg (Calculated) : 77.6 Heparin Dosing Weight: 95.8 kg  Vital Signs: Temp: 97.7 F (36.5 C) (11/14 1630) Temp Source: Oral (11/14 1630) BP: 97/63 (11/14 1900) Pulse Rate: 54 (11/14 1900)  Labs: Recent Labs    05/31/2018 0649  05/19/2018 1423 06/14/2018 1840  05/22/2018 2332 05/29/18 0352 05/29/18 0450 05/29/18 1025 05/29/18 1332 05/29/18 1851  HGB  --    < > 11.0* 11.1*  --  12.3*  --  11.1*  --   --   --   HCT  --    < > 33.5* 33.4*  --  36.2*  --  32.6*  --  31.3*  --   PLT  --    < > 189 182  --  224  --  213  --   --   --   LABPROT 14.5  --   --   --   --   --   --   --   --   --   --   INR 1.14  --   --   --   --   --   --   --   --   --   --   CREATININE  --   --  1.30*  --   --  1.24  --  1.17  --   --   --   TROPONINI  --   --   --   --    < > 0.04* 0.04*  --  0.04*  --  0.45*   < > = values in this interval not displayed.    Estimated Creatinine Clearance: 57.4 mL/min (by C-G formula based on SCr of 1.17 mg/dL).   Medical History: Past Medical History:  Diagnosis Date  . Aortic stenosis   . Arthritis    osteoarthritis  . Coronary artery disease   . GERD (gastroesophageal reflux disease)   . H/O carotid endarterectomy   . Hearing loss   . High cholesterol   . Hypertension   . Ischemic cardiomyopathy   . Neuropathy   . PAD (peripheral artery disease) (HCC)   . Paroxysmal atrial fibrillation (HCC)   . Peripheral vascular disease (HCC)   . Presence of permanent cardiac pacemaker 01/03/2018   for 2nd degree AV block  . Sleep  apnea     Medications:  Patient was taking Apixaban 5mg  BID prior to admission, patient has not received any since admission on 05/29/2018.  Assessment: Patient is a 84yo male admitted for surgery, patient is s/p bilateral femoral endarterectomies with external iliac endarterectomies and bilateral common iliac artery stenting on 06/09/2018. Patient now with elevated troponins. Pharmacy consulted to begin Heparin drip with NO BOLUS.  Noted that patient did receive Lovenox 40mg  SQ this morning at 08:30.  Goal of Therapy:  Heparin level 0.3-0.7 units/ml aPTT 50 - 90 seconds Monitor platelets by anticoagulation protocol: Yes   Plan:  Start heparin infusion at 1250 units/hr Check anti-Xa level in 8 hours and daily while on heparin Continue to monitor H&H and platelets  Since patient was taking apixaban PTA will check both a heparin level and APTT.  Clovia CuffLisa Murle Otting, PharmD, BCPS 05/29/2018 8:18 PM

## 2018-05-29 NOTE — Progress Notes (Signed)
EKG not concerning for any acute ischemic changes, no changes noted from previous EKG.  Troponin is 0.04, no complaints of chest pain or shortness of breath.  BNP 575, however CXR with stable cardiomegaly and no acute pulmonary edema or acute disease.  It is noted pt has a history of severe 3 vessel & Left Main CAD, and ischemic cardiomyopathy with LVEF 25-30% as noted in 2018.  Will trend troponin, and will decrease IVF to 50 ml/hr for now, and will d/c IVF when pt is consistently tolerating PO/liquids.   Justin Mendez, AGACNP-BC [facility] Pager: [phone] Cell: [phone]

## 2018-05-29 NOTE — Progress Notes (Addendum)
Name: Justin Mendez MRN: [id number] DOB: March 17, 1933    ADMISSION DATE:  06/10/2018 CONSULTATION DATE: 06/04/2018  REFERRING MD : Dr. Gilda Crease   CHIEF COMPLAINT: Bilateral lower extremity rest pain   BRIEF PATIENT DESCRIPTION:  84 yo male admitted s/p bilateral femoral endarterectomies with external iliac endarterectomies, bilateral common iliac artery stenting with stenting of the external iliac artery  SIGNIFICANT EVENTS  11/13-Pt underwent bilateral common femoral, superficial femoral and profunda femoris  endarterectomy with Cormatrix patch angioplasty. Bilateral external iliac artery endarterectomy with via bond stent placement to tack down the leading edge of the plaque. Bilateral common iliac artery angioplasty and stent placement using the kissing balloon technique with Dr. Wyn Quaker performing the stent from the right side and myself performing the left stent. Introduction catheter into aorta bilateral femoral approach with Dr. Wyn Quaker performing the right side and myself performing the left side. Right iliofemoral femoral angiography performed by Dr. Wyn Quaker. Left iliofemoral angiography  HISTORY OF PRESENT ILLNESS:   This is a 84 yo male with a PMH of OSA, Permanent Cardiac Pacemaker, PVD, Paroxysmal Atrial Fibrillation, PAD, Neuropathy, Ischemic Cardiomyopathy, HTN, Hypercholesteremia, GERD, CAD, Osteoarthritis, Aortic Stenosis.  He has a history of severe atherosclerosis of bilateral lower extremities associated with lifestyle limiting claudication and rest pain.  He presented to [facility] on 11/13 for bilateral femoral endarterectomies with external iliac endarterectomies, bilateral common iliac artery stenting with stenting of the external iliac artery.  He was admitted to ICU postop for additional workup and treatment   PAST MEDICAL HISTORY :   has a past medical history of Aortic stenosis, Arthritis, Coronary artery disease, GERD (gastroesophageal reflux disease), H/O carotid endarterectomy,  Hearing loss, High cholesterol, Hypertension, Ischemic cardiomyopathy, Neuropathy, PAD (peripheral artery disease) (HCC), Paroxysmal atrial fibrillation (HCC), Peripheral vascular disease (HCC), Presence of permanent cardiac pacemaker (01/03/2018), and Sleep apnea.  has a past surgical history that includes Knee surgery; Neck surgery; carotid endarderectomy (Bilateral, 11/2001); RIGHT/LEFT HEART CATH AND CORONARY ANGIOGRAPHY (N/A, 04/29/2017); Pacemaker insertion (Left, 01/03/2018); Lower Extremity Angiography (Right, 04/29/2018); Replacement total knee bilateral (Bilateral); Insert / replace / remove pacemaker; Joint replacement; Cardiac catheterization (05/07/2017); rectal fistula surgery; Mandible surgery; Endarterectomy femoral (Bilateral, 05/27/2018); and Insertion of iliac stent (Bilateral, 05/31/2018).  REVIEW OF SYSTEMS: Positives in BOLD  Constitutional: Negative for fever, chills, weight loss, malaise/fatigue and diaphoresis.  HENT: Negative for hearing loss, ear pain, nosebleeds, congestion, sore throat, neck pain, tinnitus and ear discharge.   Eyes: Negative for blurred vision, double vision, photophobia, pain, discharge and redness.  Respiratory: Negative for cough, hemoptysis, sputum production, shortness of breath, wheezing and stridor.   Cardiovascular: Negative for chest pain, palpitations, orthopnea, claudication, leg swelling and PND.  Gastrointestinal: Negative for heartburn, nausea, vomiting, abdominal pain, diarrhea, constipation, blood in stool and melena.  Genitourinary: Negative for dysuria, urgency, frequency, hematuria and flank pain.  Musculoskeletal: Negative for myalgias, back pain, joint pain and falls.  Skin: bilateral groin pain at incision sites, itching and rash.  Neurological: Negative for dizziness, tingling, tremors, sensory change, speech change, focal weakness, seizures, loss of consciousness, weakness and headaches.  Endo/Heme/Allergies: Negative for  environmental allergies and polydipsia. Does not bruise/bleed easily.  SUBJECTIVE:  Pt remains on dopamine. Pt c/o mild bilateral groin pain   VITAL SIGNS: Temp:  [97.6 F (36.4 C)-98 F (36.7 C)] 97.7 F (36.5 C) (11/14 0700) Pulse Rate:  [57-77] 64 (11/14 0800) Resp:  [0-23] 15 (11/14 0800) BP: (89-137)/(44-115) 102/87 (11/14 0800) SpO2:  [90 %-100 %] 96 % (11/14 0800) Arterial  Line BP: (85-144)/(40-64) 108/47 (11/14 0600) Weight:  [95.8 kg] 95.8 kg (11/13 1744)  PHYSICAL EXAMINATION: General: well developed, well nourished male, NAD  Neuro: alert and oriented, follows commands HEENT: supple, no JVD  Cardiovascular: dual paced, no R/G Lungs: clear throughout, even, non labored  Abdomen: +BS x4, soft, non tender, non distended  Musculoskeletal: normal bulk and tone, no edema  Skin: bilateral groin incision sites honeycomb dressings intact no hematoma or bleeding, right groin ecchymosis present, bilateral lower extremities warm   Recent Labs  Lab 05/30/2018 1423 05/27/2018 2332 05/29/18 0450  NA 143 140 141  K 4.6 4.6 4.4  CL 111 108 110  CO2 22 23 25   BUN 20 22 23   CREATININE 1.30* 1.24 1.17  GLUCOSE 135* 144* 136*   Recent Labs  Lab 05/17/2018 1840 06/07/2018 2332 05/29/18 0450  HGB 11.1* 12.3* 11.1*  HCT 33.4* 36.2* 32.6*  WBC 15.1* 22.6* 21.3*  PLT 182 224 213   Dg Chest Port 1 View  Result Date: 05/22/2018 CLINICAL DATA:  Hypoxia EXAM: PORTABLE CHEST 1 VIEW COMPARISON:  January 03, 2018 FINDINGS: Stable cardiomegaly. Stable pacemaker leads. The heart, hila, mediastinum, lungs, and pleura are otherwise unchanged and unremarkable with no acute abnormalities. IMPRESSION: No active disease. Electronically Signed   By: Gerome Samavid  Williams III M.D   On: 06/02/2018 22:34   Dg C-arm 1-60 Min  Result Date: 05/24/2018 CLINICAL DATA:  84 year old male with peripheral arterial disease undergoing arteriography and intervention EXAM: DG C-ARM 61-120 MIN COMPARISON:  None. FINDINGS:  A total of 14 intraoperative saved images are submitted for review. The images document balloon angioplasty of kissing common iliac artery stents, placement of a left external iliac artery stent and placement of a right external iliac artery stent. Of note, on the right to the superficial femoral artery is severely diseased and nearly occluded. IMPRESSION: Placement of kissing iliac stents and bilateral external iliac stents. The right superficial femoral artery is heavily diseased and nearly occluded. Please see operative note for full detail. Electronically Signed   By: Malachy MoanHeath  McCullough M.D.   On: 06/02/2018 12:59    ASSESSMENT / PLAN: Severe atherosclerosis of bilateral lower extremities with claudication and rest pain s/p bilateral femoral endarterectomies with external iliac endarterectomies, bilateral common iliac artery stenting with stenting of the external iliac artery-05/23/2018 Hypotension may be secondary to volume repletion  Hx: CAD, HTN, Ischemic Cardiomyopathy, Hypercholesteremia, Paroxysmal Atrial Fibrillation, Permanent Pacemaker, and Aortic Stenosis Continuous telemetry monitoring  Continue outpatient cardiac medications Prn dopamine gtt maintain map >65 Hold outpatient antihypertensives for now IV albumin x1 dose  VTE px: subq heparin  Trend CBC  Monitor for s/sx of bleeding and transfuse for hgb <7 Trend WBC and monitor fever curve    Obstructive Sleep Apnea Supplemental O2 for dyspnea and/or hypoxia CPAP qhs   GERD Continue protonix   Postop pain Continue prn morphine and percocet for pain management   VTE px: eliquis   Physical therapy consulted  Frequent OOB to chair   Sonda Rumbleana Blakeney, AGNP  Pulmonary/Critical Care Pager [phone] (please enter 7 digits) PCCM Consult Pager [phone] (please enter [phone] digits)

## 2018-05-29 NOTE — Progress Notes (Signed)
[facility]  Daily Progress Note   Subjective: 1 Day Post-Op: Bilateral common femoral, superficial femoral and profunda femoris endarterectomy with Cormatrix patch angioplasty, Bilateral external iliac artery endarterectomy with via bond stent placement to tack down the leading edge of the plaque, Bilateral common iliac artery angioplasty and stent placement using the kissing balloon technique with Dr. Wyn Quakerew performing the stent from the right side and myself performing the left stent, Introduction catheter into aorta bilateral femoral approach with Dr. Wyn Quakerew performing the right side and myself performing the left side, Right iliofemoral femoral angiography performed by Dr. Wyn Quakerew with Left iliofemoral angiography performed by Dr. Gilda CreaseSchnier  Patient's night of surgery was relatively unremarkable.  Patient had an episode of chest pain for which the work-up was negative.  Patient was seen and examined with his wife at the bedside.  Patient is still on dopamine drip.  Patient denies any shortness of breath or chest pain.  Patient notes minimal discomfort to the bilateral groins.  Patient is currently sitting up in a chair relatively comfortably.  Objective: Vitals:   05/29/18 0600 05/29/18 0700 05/29/18 0715 05/29/18 0800  BP: (!) 116/57 (!) 120/56 (!) 119/48 102/87  Pulse: 61 (!) 59 65 64  Resp: 15 15 (!) 23 15  Temp:  97.7 F (36.5 C)    TempSrc:  Oral    SpO2: 92% 99% 93% 96%  Weight:      Height:        Intake/Output Summary (Last 24 hours) at 05/29/2018 1154 Last data filed at 05/29/2018 0800 Gross per 24 hour  Intake 2575.39 ml  Output 3025 ml  Net -449.61 ml   Physical Exam: A&Ox3, NAD CV: RRR Pulmonary: CTA Bilaterally Abdomen: Soft, Nontender, Nondistended, (+) Bowel Sounds Groin: OR dressings in place, clean and dry bilaterally. Some ecchymosis noted to the right side. GU: Foley removed.   Vascular:  Right Lower Extremity: Thigh soft, calf soft. Non-tender  to palpation. Minimal edema. Extremity is warm down to foot. Motor / Sensory intact.   Left Lower Extremity: Thigh soft, calf soft. Non-tender to palpation. Minimal edema. Extremity is warm down to foot. Motor / Sensory intact.    Laboratory: CBC    Component Value Date/Time   WBC 21.3 (H) 05/29/2018 0450   HGB 11.1 (L) 05/29/2018 0450   HCT 32.6 (L) 05/29/2018 0450   PLT 213 05/29/2018 0450   BMET    Component Value Date/Time   NA 141 05/29/2018 0450   K 4.4 05/29/2018 0450   CL 110 05/29/2018 0450   CO2 25 05/29/2018 0450   GLUCOSE 136 (H) 05/29/2018 0450   BUN 23 05/29/2018 0450   CREATININE 1.17 05/29/2018 0450   CALCIUM 8.8 (L) 05/29/2018 0450   GFRNONAA 56 (L) 05/29/2018 0450   GFRAA >60 05/29/2018 0450   Assessment/Planning: The patient is an 84 year old male with a past medical history of peripheral artery disease status post bilateral femoral endarterectomies POD#1 - Stable 1) Patient is still on a dopamine drip.  Will plan on trying to wean off during the day.  Once weaned, the patient can be transferred to stepdown. 2) Pain control - current regimen seem to be appropriate 3) AM labs 4) Will order PT and OT consults 5) Will start Eliquis tomorrow. 6) Atherosclerotic Disease: Plavix and Lipitor. No ASA.   Discussed with Julio AlmSchnier  Loki Wuthrich PA-C 05/29/2018 11:54 AM

## 2018-05-29 NOTE — Progress Notes (Signed)
Patient heart rate increased and sustained in the 90 to 100's.  MD Schnier paged no response.

## 2018-05-29 NOTE — Progress Notes (Addendum)
Pt has slight ST depression in lateral leads on EKG, and troponin has increased to 0.45.  Cardiology consulted, called and spoke with Dr. Lady GaryFath.  Would like to place pt on Heparin drip, called and discussed with Dr. Wyn Quakerew of Vascular Surgery to get their approval on Heparin drip given that pt is 1 days post-op.  Per Dr. Wyn Quakerew, may place pt on Heparin drip, just do not give heparin bolus.  Order placed for Heparin drip without Bolus.  Called and spoke with pharmacy to reiterate not to give bolus.  Continue to trend Troponin.  Updated pt's wife, daughter, and son-in-law at bedside of acute changes and plan of care.     Justin Mendez, AGACNP-BC [facility] Pager: [phone] Cell: [phone]

## 2018-05-29 NOTE — Progress Notes (Signed)
Pts troponin elevated at 0.45. Harlon DittyJeremiah Keene, NP at bedside, confirmed pt is having ST depressions from EKG. Heparin gtt ordered and started. 325 of aspirin and SL nitro given. Troponin follow up labs ordered. Cardiology consulted, family at bedside and updated. Will continue to monitor.

## 2018-05-29 NOTE — Progress Notes (Signed)
MD Dew paged about heart rate 90-100's and no new orders. MD Sherryll BurgerShah notified and Dopamine changed to Neo. EKG ordered and Troponin levels.

## 2018-05-29 NOTE — Progress Notes (Signed)
IS education complete, pt understands technique and reason for use. Pt inspire 2000ml+. Pt independent with use 

## 2018-05-29 NOTE — Progress Notes (Signed)
Pt's follow up EKG with some slight ST depression in lateral leads, and troponin has increased to 0.45.  Cardiology consulted, called and spoke to Dr. Lady GaryFath of consult, of which he recommends starting Heparin drip.  Called and spoke with Dr. Wyn Quakerew of Vascular Surgery to have their ok to start Heparin given pt is post op day 2.  Per Dr. Wyn Quakerew, may start Heparin drip, just DO NOT GIVE a BOLUS.  Orders placed for Heparin drip, and called pharmacy to reinforce not giving a heparin bolus.    Harlon DittyJeremiah Garron Eline, AGACNP-BC [facility] Pager: [phone] Cell: [phone]

## 2018-05-29 NOTE — Evaluation (Signed)
Occupational Therapy Evaluation Patient Details Name: Justin BrownMichael Mendez MRN: [id number] DOB: 09/19/1932 Today's Date: 05/29/2018    History of Present Illness Pt is 84 year old male s/p Status post bilateral femoral endarterectomies with external iliac endarterectomies, bilateral common iliac artery stenting with stenting of the external iliac artery as well.  Procedure performed for atherosclerotic occlusive disease with rest pain. PMH of OSA, Permanent Cardiac Pacemaker, PVD, Paroxysmal Atrial Fibrillation, PAD, Neuropathy, Ischemic Cardiomyopathy, HTN, Hypercholesteremia, GERD, CAD, Osteoarthritis, Aortic Stenosis.   Clinical Impression   Pt is 84 year old male who is s/p bilateral femoral endarterectomies as described above.  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  He is on 2L of O2 and pain level is 3/10.  He is sitting up in recliner and NSG indicated he needed minimal assist to get to chair.  BP is still low (100/46) and asymptomatic due to dopamine.  He is able to complete grooming, hygiene and UB dressing and bathing skills independently after set up and min assist to complete LB dressing skills which were simulated due to low BP.  Discussed rec to use reacher, sock aid and elastic shoe laces for ADLs with rec training while in hospital.  His wife was familiar with AD and reported they did not work for her but mainly for donning TED hose.  Rec use of TED hose donning glove to assist with TED hose if these are rec for patient after DC.  No standing or OOB ambulation during this session due to low BP.  Pt and his wife were about to move to a 1 story condo which is getting painted.  They currently live in a 2 story home and have a built in shower chair in both homes but no grab bars in current bathroom but are in condo.  Rec continued OT while in hospital to continue to work on increasing independence in ADLs with practice using AD, balance and functional mobility  training, and family ed and training.  Pt most likely will not need any further OT at home.    Follow Up Recommendations  No OT follow up    Equipment Recommendations  Other (comment)(reacher, sock aid, elastic shoe laces)    Recommendations for Other Services       Precautions / Restrictions Precautions Precautions: Fall Precaution Comments: pacemaker and currently with low BP due to dopamine per NSG Restrictions Weight Bearing Restrictions: No      Mobility Bed Mobility                  Transfers                      Balance                                           ADL either performed or assessed with clinical judgement   ADL Overall ADL's : Needs assistance/impaired Eating/Feeding: Independent;Set up   Grooming: Wash/dry hands;Wash/dry face;Oral care;Applying deodorant;Brushing hair;Set up;Independent           Upper Body Dressing : Independent;Set up   Lower Body Dressing: Minimal assistance;Set up Lower Body Dressing Details (indicate cue type and reason): Rec use of reacher, sock aid and elastic shoe laces to prevent pulling of incisions and decrease pain when getting dressed and undressed.  Pt and  his wife are familiar wtih AD but got rid of them stating they did not work well.                 General ADL Comments: Pt seen while seated in chair with no standing or ambulation due to low BP and was 100/46 due to being on Dopamine.  Pt at fall for falls and rec reacher, sock aid and elastic shoe laces and TED hose donning gloves to assist with TED hose which is assumed will be rec by Dr Lorretta Harp.     Vision Baseline Vision/History: Wears glasses Wears Glasses: At all times Patient Visual Report: No change from baseline       Perception     Praxis      Pertinent Vitals/Pain Pain Assessment: 0-10 Pain Score: 3  Pain Location: bilateral LEs where incisions are Pain Descriptors / Indicators: Aching;Discomfort Pain  Intervention(s): Limited activity within patient's tolerance;Monitored during session;Premedicated before session     Hand Dominance Right   Extremity/Trunk Assessment Upper Extremity Assessment Upper Extremity Assessment: Overall WFL for tasks assessed   Lower Extremity Assessment Lower Extremity Assessment: Defer to PT evaluation       Communication Communication Communication: HOH(has hearing aids but not with him)   Cognition Arousal/Alertness: Awake/alert Behavior During Therapy: WFL for tasks assessed/performed Overall Cognitive Status: Within Functional Limits for tasks assessed                                 General Comments: Pt's wife and daughter [name] present for evaluation. He is eager to go home and hopefully have HH PT again.   General Comments       Exercises     Shoulder Instructions      Home Living Family/patient expects to be discharged to:: Private residence Living Arrangements: Spouse/significant other Available Help at Discharge: Family Type of Home: House Home Access: Stairs to enter Secretary/administrator of Steps: 4   Home Layout: Two level Alternate Level Stairs-Number of Steps: 12 steps to upstairs where his computer is   Bathroom Shower/Tub: IT trainer: Handicapped height Bathroom Accessibility: Yes How Accessible: Accessible via wheelchair Home Equipment: Shower seat - built in;Toilet riser;Hand held shower head   Additional Comments: Pt and his wife just bought a 1 level condo with no stairs but have not moved in yet.      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  He did not use any AD for ambulation.        OT Problem List: Pain;Decreased range of motion;Decreased activity tolerance      OT Treatment/Interventions: Self-care/ADL training;DME and/or  AE instruction;Patient/family education    OT Goals(Current goals can be found in the care plan section) Acute Rehab OT Goals Patient Stated Goal: to go home and get back to [employer] to greet people OT Goal Formulation: With patient/family Time For Goal Achievement: 06/12/18 Potential to Achieve Goals: Good ADL Goals Pt Will Perform Lower Body Dressing: with supervision;sit to/from stand;with adaptive equipment Pt Will Transfer to Toilet: with supervision;stand pivot transfer;bedside commode;regular height toilet Pt Will Perform Toileting - Clothing Manipulation and hygiene: with supervision;sit to/from stand  OT Frequency: Min 1X/week   Barriers to D/C:  Co-evaluation              AM-PAC PT "6 Clicks" Daily Activity     Outcome Measure Help from another person eating meals?: None Help from another person taking care of personal grooming?: None Help from another person toileting, which includes using toliet, bedpan, or urinal?: A Little Help from another person bathing (including washing, rinsing, drying)?: A Little Help from another person to put on and taking off regular upper body clothing?: None Help from another person to put on and taking off regular lower body clothing?: A Little 6 Click Score: 21   End of Session Nurse Communication: Other (comment)(discussed low BP and NSG stated it was due to Dopamine)  Activity Tolerance: Patient tolerated treatment well Patient left: in chair;with call bell/phone within reach;with family/visitor present;Other (comment)(NSG indicated he did not need chair alarm if family was in room with him)  OT Visit Diagnosis: Unsteadiness on feet (R26.81);Pain;Muscle weakness (generalized) (M62.81) Pain - Right/Left: (bilateral R>L upper leg area) Pain - part of body: Leg                Time: 1000-1045 OT Time Calculation (min): 45 min Charges:  OT General Charges $OT Visit: 1 Visit OT Evaluation $OT Eval Low Complexity: 1  Low OT Treatments $Self Care/Home Management : 23-37 mins  Susanne Borders, OTR/L ascom [phone] 05/29/18, 11:28 AM

## 2018-05-29 NOTE — Evaluation (Signed)
Physical Therapy Evaluation Patient Details Name: Justin BrownMichael Mendez MRN: [id number] DOB: [birth year] Today's Date: 05/29/2018   History of Present Illness  Pt is 84 year old male s/p Status post bilateral femoral endarterectomies with external iliac endarterectomies, bilateral common iliac artery stenting with stenting of the external iliac artery as well.  Procedure performed for atherosclerotic occlusive disease with rest pain. PMH of OSA, Permanent Cardiac Pacemaker, PVD, Paroxysmal Atrial Fibrillation, PAD, Neuropathy, Ischemic Cardiomyopathy, HTN, Hypercholesteremia, GERD, CAD, Osteoarthritis, Aortic Stenosis.    Clinical Impression  Pt admitted with above diagnosis. Pt currently with functional limitations due to the deficits listed below (see PT Problem List).  Justin Mendez was very pleasant and agreeable to therapy.  He is independent with all aspects of mobility at baseline.  He demonstrates instability with balance testing this session and thus recommending HHPT at d/c to address this.  Pt has a SPC to use at home should he need to.  Pt with mild instability with sit>stand transfer and ambulation this session, but no LOB or physical assist needed.  BP remained stable throughout session (see general comments below).  SpO2 and HR remained stable throughout session as well. Pt will benefit from skilled PT to increase their independence and safety with mobility to allow discharge to the venue listed below.      Follow Up Recommendations Home health PT    Equipment Recommendations  None recommended by PT(pt has cane at home if needed)    Recommendations for Other Services       Precautions / Restrictions Precautions Precautions: Fall Precaution Comments: radial a-line, monitor BP Restrictions Weight Bearing Restrictions: No      Mobility  Bed Mobility Overal bed mobility: Independent             General bed mobility comments: No physical assist or cues needed.  Pt performs  independently.   Transfers Overall transfer level: Needs assistance Equipment used: None Transfers: Sit to/from Stand Sit to Stand: Min guard         General transfer comment: Min guard for safety as pt demonstrates instability but no LOB  Ambulation/Gait Ambulation/Gait assistance: Min guard Gait Distance (Feet): 80 Feet Assistive device: None Gait Pattern/deviations: Step-through pattern;Wide base of support     General Gait Details: Pt with mild instability but no LOB and demonstrates slightly wider BOS for improved stability when ambulating.   Stairs            Wheelchair Mobility    Modified Rankin (Stroke Patients Only)       Balance Overall balance assessment: Needs assistance Sitting-balance support: No upper extremity supported;Feet supported Sitting balance-Leahy Scale: Good     Standing balance support: No upper extremity supported;During functional activity Standing balance-Leahy Scale: Fair Standing balance comment: Pt able to stand statically and ambulate without UE support but does demonstrate unsteadiness with challenges to balance in standing/ambulation as documented below     Tandem Stance - Right Leg: 0 Tandem Stance - Left Leg: 0 Rhomberg - Eyes Opened: 30 Rhomberg - Eyes Closed: 30(with increased sway but no LOB)                 Pertinent Vitals/Pain Pain Assessment: 0-10 Pain Score: 3  Pain Location: 3/10 RLE and 2/10 LLE Pain Descriptors / Indicators: Discomfort Pain Intervention(s): Limited activity within patient's tolerance;Monitored during session;Repositioned    Home Living Family/patient expects to be discharged to:: Private residence Living Arrangements: Spouse/significant other Available Help at Discharge: Family;Available PRN/intermittently(wife works during the day)  Type of Home: House Home Access: Stairs to enter Entrance Stairs-Rails: None Entrance Stairs-Number of Steps: 2 Home Layout: Two level Home  Equipment: Shower seat - built in;Toilet riser;Hand held shower head Additional Comments: Pt and his wife just bought a 1 level condo with no stairs but have not moved in yet.  The information above is for his home layout.  He has 2 steps to enter the home.  He has 4 steps to get up to the kitchen with L rail, he has a flight of steps up to 2nd floor with L rail.  Pt able to sleep downstairs (bedroom with bed downstairs).      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  He did not use any AD for ambulation.  Pt denies any falls in the past 6 months.     Hand Dominance   Dominant Hand: Right    Extremity/Trunk Assessment   Upper Extremity Assessment Upper Extremity Assessment: Overall WFL for tasks assessed    Lower Extremity Assessment Lower Extremity Assessment: LLE deficits/detail;RLE deficits/detail RLE Deficits / Details: Did not assess hip F due to groin incisions.  Strength grossly 4/5 BLE.  LLE Deficits / Details: Did not assess hip F due to groin incisions.  Strength grossly 4/5 BLE.        Communication   Communication: HOH(has hearing aids but not with him)  Cognition Arousal/Alertness: Awake/alert Behavior During Therapy: WFL for tasks assessed/performed Overall Cognitive Status: Within Functional Limits for tasks assessed                                 General Comments: Pt's wife and daughter [name] present for evaluation. He is eager to go home and hopefully have HH PT again.      General Comments General comments (skin integrity, edema, etc.): BP monitored closely with each mobility transition which remained stable with highest reading in sitting at start of session (systolic 124) and lowest while ambulating (systolic 99).  Pt denies dizziness or lightheadedness throughout session.  Balance tested in standard stance with eyes open and  eyes closed without issue.  Wife present throughout session.      Exercises Other Exercises Other Exercises: Encouraged pt to ambulate at least 3x/day with nursing staff while in hospital and at least every 45 minutes-1hr when at home.    Assessment/Plan    PT Assessment Patient needs continued PT services  PT Problem List Decreased strength;Decreased balance;Decreased safety awareness       PT Treatment Interventions DME instruction;Gait training;Stair training;Functional mobility training;Therapeutic activities;Therapeutic exercise;Balance training;Neuromuscular re-education;Patient/family education    PT Goals (Current goals can be found in the Care Plan section)  Acute Rehab PT Goals Patient Stated Goal: to d/c home and return to PLOF PT Goal Formulation: With patient Time For Goal Achievement: 06/12/18 Potential to Achieve Goals: Good    Frequency Min 2X/week   Barriers to discharge        Co-evaluation               AM-PAC PT "6 Clicks" Daily Activity  Outcome Measure Difficulty turning over in bed (including adjusting bedclothes, sheets and blankets)?: None Difficulty moving from lying on back to sitting on the side of the bed? : None Difficulty sitting down on and standing up  from a chair with arms (e.g., wheelchair, bedside commode, etc,.)?: A Little Help needed moving to and from a bed to chair (including a wheelchair)?: A Little Help needed walking in hospital room?: A Little Help needed climbing 3-5 steps with a railing? : A Little 6 Click Score: 20    End of Session Equipment Utilized During Treatment: Gait belt Activity Tolerance: Patient tolerated treatment well Patient left: in bed;with call bell/phone within reach;with bed alarm set;with SCD's reapplied;with family/visitor present Nurse Communication: Mobility status;Other (comment)(session complete, can adjust a-line) PT Visit Diagnosis: Unsteadiness on feet (R26.81);Other abnormalities of gait and  mobility (R26.89)    Time: 1610-9604 PT Time Calculation (min) (ACUTE ONLY): 45 min   Charges:   PT Evaluation $PT Eval Moderate Complexity: 1 Mod PT Treatments $Gait Training: 8-22 mins $Therapeutic Activity: 8-22 mins        Encarnacion Chu PT, DPT 05/29/2018, 1:27 PM

## 2018-05-30 ENCOUNTER — Inpatient Hospital Stay: Payer: Medicare Other

## 2018-05-30 ENCOUNTER — Inpatient Hospital Stay
Admission: RE | Admit: 2018-05-30 | Discharge: 2018-05-30 | Disposition: A | Discharge status: Home / Self Care | Payer: Medicare Other | Source: Ambulatory Visit | Attending: Pulmonary Disease | Admitting: Pulmonary Disease

## 2018-05-30 ENCOUNTER — Inpatient Hospital Stay: Payer: Self-pay

## 2018-05-30 LAB — ECHOCARDIOGRAM LIMITED
HEIGHTINCHES: 72 in
Weight: 3379.21 oz

## 2018-05-30 LAB — CBC
HCT: 31.2 % — ABNORMAL LOW (ref 39.0–52.0)
Hemoglobin: 10.3 g/dL — ABNORMAL LOW (ref 13.0–17.0)
MCH: 31.4 pg (ref 26.0–34.0)
MCHC: 33 g/dL (ref 30.0–36.0)
MCV: 95.1 fL (ref 80.0–100.0)
Platelets: 207 10*3/uL (ref 150–400)
RBC: 3.28 MIL/uL — ABNORMAL LOW (ref 4.22–5.81)
RDW: 13.3 % (ref 11.5–15.5)
WBC: 19 10*3/uL — ABNORMAL HIGH (ref 4.0–10.5)
nRBC: 0 % (ref 0.0–0.2)

## 2018-05-30 LAB — BASIC METABOLIC PANEL
Anion gap: 9 (ref 5–15)
BUN: 37 mg/dL — AB (ref 8–23)
CALCIUM: 8.9 mg/dL (ref 8.9–10.3)
CO2: 21 mmol/L — ABNORMAL LOW (ref 22–32)
Chloride: 109 mmol/L (ref 98–111)
Creatinine, Ser: 1.63 mg/dL — ABNORMAL HIGH (ref 0.61–1.24)
GFR calc Af Amer: 43 mL/min — ABNORMAL LOW (ref >60)
GFR, EST NON AFRICAN AMERICAN: 37 mL/min — AB (ref >60)
Glucose, Bld: 187 mg/dL — ABNORMAL HIGH (ref 70–99)
Potassium: 4.3 mmol/L (ref 3.5–5.1)
SODIUM: 139 mmol/L (ref 135–145)

## 2018-05-30 LAB — APTT
aPTT: 77 seconds — ABNORMAL HIGH (ref 24–36)
aPTT: 96 seconds — ABNORMAL HIGH (ref 24–36)

## 2018-05-30 LAB — PROCALCITONIN: Procalcitonin: <0.1 ng/mL

## 2018-05-30 LAB — TROPONIN I
Troponin I: 27.73 ng/mL (ref <0.03)
Troponin I: >65 ng/mL (ref <0.03)

## 2018-05-30 LAB — MAGNESIUM: MAGNESIUM: 2.3 mg/dL (ref 1.7–2.4)

## 2018-05-30 LAB — HEPARIN LEVEL (UNFRACTIONATED): HEPARIN UNFRACTIONATED: 0.75 [IU]/mL — AB (ref 0.30–0.70)

## 2018-05-30 LAB — SURGICAL PATHOLOGY

## 2018-05-30 LAB — GLUCOSE, CAPILLARY: GLUCOSE-CAPILLARY: 112 mg/dL — AB (ref 70–99)

## 2018-05-30 MED ORDER — LACTATED RINGERS IV SOLN
INTRAVENOUS | Status: DC
  Administered 2018-05-30 – 2018-05-31 (×2): via INTRAVENOUS

## 2018-05-30 MED ORDER — ASPIRIN 81 MG PO CHEW
81.0000 mg | CHEWABLE_TABLET | Freq: Every day | ORAL | Status: DC
  Administered 2018-05-30 – 2018-05-31 (×2): 81 mg via ORAL
  Filled 2018-05-30 (×2): qty 1

## 2018-05-30 MED ORDER — SODIUM CHLORIDE 0.9% FLUSH: 10.0000 mL | INTRAVENOUS | Status: DC | PRN

## 2018-05-30 MED ORDER — SODIUM CHLORIDE 0.9% FLUSH
10.0000 mL | Freq: Two times a day (BID) | INTRAVENOUS | Status: DC
  Administered 2018-05-30 (×2): 10 mL
  Administered 2018-05-31: 20 mL

## 2018-05-30 NOTE — Progress Notes (Signed)
Spoke to GrenadaBrittany, Charity fundraiserN concerning placement of PICC. She was informed that a PICC nurse will be on site at 1100 to place PICC.

## 2018-05-30 NOTE — Progress Notes (Signed)
ANTICOAGULATION CONSULT NOTE - Initial Consult  Pharmacy Consult for Heparin drip Indication: chest pain/ACS  Allergies  Allergen Reactions  . Iodinated Diagnostic Agents Other (See Comments)     Was very "flushed", denies itching, swelling or hives. He states this was over 30 years ago with Ionic contrast and not the current non-ionic contrast. SPM    Patient Measurements: Height: 6' (182.9 cm) Weight: 211 lb 3.2 oz (95.8 kg) IBW/kg (Calculated) : 77.6 Heparin Dosing Weight: 95.8 kg  Vital Signs: Temp: 98.2 F (36.8 C) (11/15 1630) Temp Source: Oral (11/15 1630) BP: 110/55 (11/15 1630) Pulse Rate: 50 (11/15 1630)  Labs: Recent Labs    05/27/2018 0649  05/22/2018 2332  05/29/18 0450  05/29/18 1332  05/30/18 0206 05/30/18 0716 05/30/18 1435  HGB  --    < > 12.3*  --  11.1*  --   --   --  10.3*  --   --   HCT  --    < > 36.2*  --  32.6*  --  31.3*  --  31.2*  --   --   PLT  --    < > 224  --  213  --   --   --  207  --   --   APTT  --   --   --   --   --   --   --   --  77*  --  96*  LABPROT 14.5  --   --   --   --   --   --   --   --   --   --   INR 1.14  --   --   --   --   --   --   --   --   --   --   HEPARINUNFRC  --   --   --   --   --   --   --   --  0.75*  --   --   CREATININE  --    < > 1.24  --  1.17  --   --   --  1.63*  --   --   TROPONINI  --   --  0.04*   < >  --    < >  --    < > 27.73* >65.00* >65.00*   < > = values in this interval not displayed.    Estimated Creatinine Clearance: 41.2 mL/min (A) (by C-G formula based on SCr of 1.63 mg/dL (H)).   Medical History: Past Medical History:  Diagnosis Date  . Aortic stenosis   . Arthritis    osteoarthritis  . Coronary artery disease   . GERD (gastroesophageal reflux disease)   . H/O carotid endarterectomy   . Hearing loss   . High cholesterol   . Hypertension   . Ischemic cardiomyopathy   . Neuropathy   . PAD (peripheral artery disease) (HCC)   . Paroxysmal atrial fibrillation (HCC)   . Peripheral  vascular disease (HCC)   . Presence of permanent cardiac pacemaker 01/03/2018   for 2nd degree AV block  . Sleep apnea     Medications:  Patient was taking Apixaban 5mg  BID prior to admission, patient has not received any since admission on 06/14/2018.  Assessment: Patient is a 84yo male admitted for surgery, patient is s/p bilateral femoral endarterectomies with external iliac endarterectomies and bilateral common iliac artery stenting on 06/05/2018. Patient now  with elevated troponins. Pharmacy consulted to begin Heparin drip with NO BOLUS.  Noted that patient did receive Lovenox 40mg  SQ this morning at 08:30.  Goal of Therapy:  Heparin level 0.3-0.7 units/ml aPTT 50 - 90 seconds Monitor platelets by anticoagulation protocol: Yes   Plan:  Decrease heparin rate to 1150 units/hr. Will obtain follow up aPTT at 0100.   Pharmacy will continue to monitor and adjust per consult.   MLS 05/30/18 4:45 PM

## 2018-05-30 NOTE — Progress Notes (Signed)
Dr. Gilda CreaseSchnier at bedside and RN assessed patient's right groin site with MD and assessed the bruising around incision site on right side.  MD speaking with patient, his wife and daughter at this time.

## 2018-05-30 NOTE — Care Management Note (Signed)
Case Management Note  Patient Details  Name: Justin BrownMichael Mendez MRN: [id number] Date of Birth: 04/01/1933  Subjective/Objective:     Patient is admitted to the ICU currently on phenylephrine drip and heparin.  [facility]  PT recommends home health physical therapy.  [facility].  Wife Justin RhodesBetty thinks that they have had [facility] in the past.  Choice offered and patient and family prefers [facility].  At time of discharge RNCM [facility]   Robbie LisJeanna Ted Leonhart RN BSN [phone]                 Action/Plan:   Expected Discharge Date:                  Expected Discharge Plan:  Home w Home Health Services  In-House Referral:     Discharge planning Services  CM Consult  Post Acute Care Choice:    Choice offered to:  Patient, Spouse  DME Arranged:    DME Agency:     HH Arranged:    [facility]  Status of Service:  In process, will continue to follow  If discussed at Long Length of Stay Meetings, dates discussed:    Additional Comments:  Allayne ButcherJeanna M Bobby Barton, RN 05/30/2018, 2:44 PM

## 2018-05-30 NOTE — Progress Notes (Signed)
Spoke with Dr. Belia HemanKasa and made MD aware that patient reports that he has peed twice today but no staff has emptied urinal nor has he been incontinent.  Bladder scanned and resulted 252 cc/H.  Patient reports not needing to void.  MD acknowledged and gave no new orders at this time.

## 2018-05-30 NOTE — Progress Notes (Signed)
*  PRELIMINARY RESULTS* Echocardiogram 2D Echocardiogram has been performed.  Cristela BlueHege, Horacio Werth 05/30/2018, 8:25 AM

## 2018-05-30 NOTE — Progress Notes (Signed)
[facility]  Daily Progress Note   Subjective  - 2 Days Post-Op  Patient states he is not having any pain in either of his legs this is a tremendous improvement over preop.  Earlier today he did have some knee pain but this is resolved.  With respect to his chest pains this at the time of my visit had completely resolved as well.  At this point he has had a clear elevation of his troponins consistent with a postoperative myocardial infarction but he is remaining hemodynamically stable.  Cardiology is involved and greatly appreciated.  Patient is off both his Neo-Synephrine drip as well as his dopamine drip.  Objective Vitals:   05/30/18 1545 05/30/18 1600 05/30/18 1630 05/30/18 1700  BP: (!) 108/57 (!) 103/58 (!) 110/55 102/61  Pulse: (!) 54 (!) 49 (!) 50 (!) 57  Resp: 19 15 (!) 27 (!) 30  Temp:   98.2 F (36.8 C)   TempSrc:   Oral   SpO2: 96% 96% 91% 93%  Weight:      Height:        Intake/Output Summary (Last 24 hours) at 05/30/2018 1820 Last data filed at 05/30/2018 1700 Gross per 24 hour  Intake 1584.83 ml  Output 75 ml  Net 1509.83 ml    PULM  Normal effort , no use of accessory muscles CV  No JVD, paced RRR Abd      No distended, nontender VASC  both feet are pink and warm with good Doppler signals noted.  Both groins are intact there is minimal ecchymosis of the left there is very mild ecchymosis of the right clinically consistent with surgery and not unusual  Laboratory CBC    Component Value Date/Time   WBC 19.0 (H) 05/30/2018 0206   HGB 10.3 (L) 05/30/2018 0206   HCT 31.2 (L) 05/30/2018 0206   PLT 207 05/30/2018 0206    BMET    Component Value Date/Time   NA 139 05/30/2018 0206   K 4.3 05/30/2018 0206   CL 109 05/30/2018 0206   CO2 21 (L) 05/30/2018 0206   GLUCOSE 187 (H) 05/30/2018 0206   BUN 37 (H) 05/30/2018 0206   CREATININE 1.63 (H) 05/30/2018 0206   CALCIUM 8.9 05/30/2018 0206   GFRNONAA 37 (L) 05/30/2018 0206   GFRAA 43 (L) 05/30/2018 0206    Assessment/Planning: POD #2 s/p bilateral femoral endarterectomies with common iliac artery stenting and external iliac artery stenting   Continue supportive care regarding his myocardial infarction.  Activities per cardiology  Continue heparin drip can be transitioned to Eliquis  Continue Plavix  Levora DredgeGregory Kimmy Parish  05/30/2018, 6:20 PM

## 2018-05-30 NOTE — Progress Notes (Signed)
Troponin elevated at 27.7, pt is complaining of no chest pain at this time. Harlon DittyJeremiah Keene, NP notified and cardiology Dr called. No intervention at this time, will continue to monitor and send repeat lab in AM.

## 2018-05-30 NOTE — Progress Notes (Addendum)
Dr. Lady GaryFath present and RN made him aware that troponin is greater than 65.  MD acknowledged and gave no new orders.

## 2018-05-30 NOTE — Progress Notes (Signed)
Denied chest pain throughout shift.  Family at bedside throughout day and updated by cardiologist twice.

## 2018-05-30 NOTE — Progress Notes (Signed)
ANTICOAGULATION CONSULT NOTE - Initial Consult  Pharmacy Consult for Heparin drip Indication: chest pain/ACS  Allergies  Allergen Reactions  . Iodinated Diagnostic Agents Other (See Comments)     Was very "flushed", denies itching, swelling or hives. He states this was over 30 years ago with Ionic contrast and not the current non-ionic contrast. SPM    Patient Measurements: Height: 6' (182.9 cm) Weight: 211 lb 3.2 oz (95.8 kg) IBW/kg (Calculated) : 77.6 Heparin Dosing Weight: 95.8 kg  Vital Signs: Temp: 98 F (36.7 C) (11/15 0000) Temp Source: Oral (11/15 0000) BP: 104/66 (11/15 0300) Pulse Rate: 97 (11/15 0300)  Labs: Recent Labs    11/16/17 0649  11/16/17 2332  05/29/18 0450 05/29/18 1025 05/29/18 1332 05/29/18 1851 05/30/18 0206  HGB  --    < > 12.3*  --  11.1*  --   --   --  10.3*  HCT  --    < > 36.2*  --  32.6*  --  31.3*  --  31.2*  PLT  --    < > 224  --  213  --   --   --  207  APTT  --   --   --   --   --   --   --   --  77*  LABPROT 14.5  --   --   --   --   --   --   --   --   INR 1.14  --   --   --   --   --   --   --   --   HEPARINUNFRC  --   --   --   --   --   --   --   --  0.75*  CREATININE  --    < > 1.24  --  1.17  --   --   --  1.63*  TROPONINI  --   --  0.04*   < >  --  0.04*  --  0.45* 27.73*   < > = values in this interval not displayed.    Estimated Creatinine Clearance: 41.2 mL/min (A) (by C-G formula based on SCr of 1.63 mg/dL (H)).   Medical History: Past Medical History:  Diagnosis Date  . Aortic stenosis   . Arthritis    osteoarthritis  . Coronary artery disease   . GERD (gastroesophageal reflux disease)   . H/O carotid endarterectomy   . Hearing loss   . High cholesterol   . Hypertension   . Ischemic cardiomyopathy   . Neuropathy   . PAD (peripheral artery disease) (HCC)   . Paroxysmal atrial fibrillation (HCC)   . Peripheral vascular disease (HCC)   . Presence of permanent cardiac pacemaker 01/03/2018   for 2nd degree  AV block  . Sleep apnea     Medications:  Patient was taking Apixaban 5mg  BID prior to admission, patient has not received any since admission on 11/16/17.  Assessment: Patient is a 84yo male admitted for surgery, patient is s/p bilateral femoral endarterectomies with external iliac endarterectomies and bilateral common iliac artery stenting on 11/16/17. Patient now with elevated troponins. Pharmacy consulted to begin Heparin drip with NO BOLUS.  Noted that patient did receive Lovenox 40mg  SQ this morning at 08:30.  Goal of Therapy:  Heparin level 0.3-0.7 units/ml aPTT 50 - 90 seconds Monitor platelets by anticoagulation protocol: Yes   Plan:  Start heparin infusion at 1250 units/hr Check  anti-Xa level in 8 hours and daily while on heparin Continue to monitor H&H and platelets  Since patient was taking apixaban PTA will check both a heparin level and APTT.  11/15 0200 aPTT 77, heparin level 0.75. Continue current regimen. Recheck aPTT, CBC and heparin level with tomorrow AM labs.  Fulton Reek, PharmD, BCPS  05/30/18 4:24 AM

## 2018-05-30 NOTE — Consult Note (Signed)
Cardiology Consultation Note    Patient ID: Justin Mendez, MRN: [id number], DOB/AGE: [birth year] 82 y.o. Admit date: 06/03/2018   Date of Consult: 05/30/2018 Primary Physician: Danella PentonMiller, Mark F, MD Primary Cardiologist: Dr. Darrold JunkerParaschos  Chief Complaint: hypotension post pvd intervention Reason for Consultation: nstemi Requesting MD: Dr. Gilda CreaseSchnier  HPI: Justin BrownMichael Truex is a 84 y.o. male with history of ischemic cardiomyopathy with ef of 25-30% with severe three vessel left main cad by cath in 05/07/17 who was not felt to be a candidate for cabg due to nonviable anterior wall by cardiac mri and comorbid medical conditions.and medical management was recommended. He had a dual chamber pacemaker placed due to complete heart block recently. He also has history of afib and has been treated with rate control and chronic anticoagulation. He is also s/[p left carotid endarterectomy, right carotid endarterectomy, history of iliac stents in the past who was recently admitted for bilateral lower extremity endarterectomy and covered stent of the distal aortic bifurcation. Post procedure he did well initially and then subsequently developed chest pain. EKG was ventricular paced. His chest pain improved with morphine. He was on dopamine initially and subsequently neo for hypotension. Initial troponin was 0.04 increased to 0.45 and subsequently 27.73 followed by greater than 60. Remains on neo this am.  Echocardiogram revealed an EF around 30% which is similar to his previous echo.  He has anteroapical akinesis.  Pain free at present.  Past Medical History:  Diagnosis Date  . Aortic stenosis   . Arthritis    osteoarthritis  . Coronary artery disease   . GERD (gastroesophageal reflux disease)   . H/O carotid endarterectomy   . Hearing loss   . High cholesterol   . Hypertension   . Ischemic cardiomyopathy   . Neuropathy   . PAD (peripheral artery disease) (HCC)   . Paroxysmal atrial fibrillation (HCC)   .  Peripheral vascular disease (HCC)   . Presence of permanent cardiac pacemaker 01/03/2018   for 2nd degree AV block  . Sleep apnea       Surgical History:  Past Surgical History:  Procedure Laterality Date  . CARDIAC CATHETERIZATION  05/07/2017  . carotid endarderectomy Bilateral 11/2001  . ENDARTERECTOMY FEMORAL Bilateral 06/04/2018   Procedure: ENDARTERECTOMY FEMORAL;  Surgeon: Renford DillsSchnier, Gregory G, MD;  Location: [facility] OR[facility];  Service: Vascular;  Laterality: Bilateral;  . INSERT / REPLACE / REMOVE PACEMAKER    . INSERTION OF ILIAC STENT Bilateral 05/22/2018   Procedure: INSERTION OF ILIAC STENT;  Surgeon: Renford DillsSchnier, Gregory G, MD;  Location: [facility];  Service: Vascular;  Laterality: Bilateral;  . JOINT REPLACEMENT    . KNEE SURGERY     Left  . LOWER EXTREMITY ANGIOGRAPHY Right 04/29/2018   Procedure: LOWER EXTREMITY ANGIOGRAPHY;  Surgeon: Renford DillsSchnier, Gregory G, MD;  Location: [facility];  Service: Cardiovascular;  Laterality: Right;  . MANDIBLE SURGERY    . NECK SURGERY     plate inserted  . PACEMAKER INSERTION Left 01/03/2018   Procedure: INSERTION PACEMAKER-INITIAL DUAL CHAMBER;  Surgeon: Marcina MillardParaschos, Alexander, MD;  Location: [facility];  Service: Cardiovascular;  Laterality: Left;  . rectal fistula surgery     x 3 or 4  . REPLACEMENT TOTAL KNEE BILATERAL Bilateral    left 08/2011; right 04/2016  . RIGHT/LEFT HEART CATH AND CORONARY ANGIOGRAPHY N/A 04/29/2017   Procedure: RIGHT/LEFT HEART CATH AND CORONARY ANGIOGRAPHY;  Surgeon: Marcina MillardParaschos, Alexander, MD;  Location: [facility];  Service: Cardiovascular;  Laterality: N/A;  Home Meds: Prior to Admission medications   Medication Sig Start Date End Date Taking? Authorizing Provider  atorvastatin (LIPITOR) 80 MG tablet Take 40 mg by mouth at bedtime.  05/04/17  Yes [provider]  clopidogrel (PLAVIX) 75 MG tablet Take 75 mg by mouth daily.  05/04/17  Yes [provider]  clotrimazole-betamethasone  (LOTRISONE) cream Apply 2 application topically as needed (itching).  10/30/17  Yes [provider]  COENZYME Q10 PO Take 10 mg by mouth daily.   Yes [provider]  ELIQUIS 5 MG TABS tablet Take 1 tablet (5 mg total) by mouth 2 (two) times daily. 01/06/18  Yes Wieting, Richard, MD  escitalopram (LEXAPRO) 10 MG tablet Take 10 mg by mouth daily.  03/05/18  Yes [provider]  fluticasone (FLONASE) 50 MCG/ACT nasal spray Place 2 sprays into both nostrils daily as needed (congestion).  05/04/17  Yes [provider]  ipratropium (ATROVENT) 0.06 % nasal spray Place 2 sprays into the nose daily.  08/30/17 08/30/18 Yes [provider]  isosorbide mononitrate (IMDUR) 30 MG 24 hr tablet Take 30 mg by mouth daily.  05/04/17  Yes [provider]  magnesium oxide (MAG-OX) 400 MG tablet Take 400 mg by mouth daily.   Yes [provider]  metoprolol succinate (TOPROL-XL) 50 MG 24 hr tablet Take 1 tablet (50 mg total) by mouth 2 (two) times daily. Take with or immediately following a meal. 01/04/18  Yes Wieting, Richard, MD  Multiple Vitamin (MULTI-VITAMINS) TABS Take 1 tablet by mouth daily.    Yes [provider]  olmesartan (BENICAR) 20 MG tablet Take 10 mg by mouth every evening.  01/22/18  Yes [provider]  pantoprazole (PROTONIX) 40 MG tablet Take 40 mg by mouth daily. 05/04/17  Yes [provider]  spironolactone (ALDACTONE) 25 MG tablet Take 12.5 mg by mouth daily.  05/04/17  Yes [provider]  nitroGLYCERIN (NITROSTAT) 0.4 MG SL tablet Place 0.4 mg under the tongue every 5 (five) minutes as needed for chest pain.    [provider]    Inpatient Medications:  . aspirin  325 mg Oral Daily  . atorvastatin  40 mg Oral QHS  . clopidogrel  75 mg Oral Daily  . docusate sodium  100 mg Oral Daily  . escitalopram  10 mg Oral Daily  . guaiFENesin  600 mg Oral BID  . ipratropium  2 spray Nasal Daily  .  magnesium oxide  400 mg Oral Daily  . multivitamin with minerals   Oral Daily  . pantoprazole  40 mg Oral Daily   . heparin 1,250 Units/hr (05/30/18 0600)  . lactated ringers 75 mL/hr at 05/30/18 0706  . magnesium sulfate 1 - 4 g bolus IVPB    . phenylephrine (NEO-SYNEPHRINE) Adult infusion 15 mcg/min (05/30/18 9604)    Allergies:  Allergies  Allergen Reactions  . Iodinated Diagnostic Agents Other (See Comments)     Was very "flushed", denies itching, swelling or hives. He states this was over 30 years ago with Ionic contrast and not the current non-ionic contrast. SPM    Social History   Socioeconomic History  . Marital status: Married    Spouse name: Not on file  . Number of children: Not on file  . Years of education: Not on file  . Highest education level: Not on file  Occupational History  . Not on file  Social Needs  . Financial resource strain: Not on file  . Food  insecurity:    Worry: Not on file    Inability: Not on file  . Transportation needs:    Medical: Not on file    Non-medical: Not on file  Tobacco Use  . Smoking status: Former Smoker    Types: Cigarettes    Last attempt to quit: 08/31/1984    Years since quitting: 33.7  . Smokeless tobacco: Never Used  Substance and Sexual Activity  . Alcohol use: Not Currently  . Drug use: No  . Sexual activity: Not on file  Lifestyle  . Physical activity:    Days per week: Not on file    Minutes per session: Not on file  . Stress: Not on file  Relationships  . Social connections:    Talks on phone: Not on file    Gets together: Not on file    Attends religious service: Not on file    Active member of club or organization: Not on file    Attends meetings of clubs or organizations: Not on file    Relationship status: Not on file  . Intimate partner violence:    Fear of current or ex partner: Not on file    Emotionally abused: Not on file    Physically abused: Not on file    Forced sexual activity: Not on  file  Other Topics Concern  . Not on file  Social History Narrative  . Not on file     Family History  Problem Relation Age of Onset  . Lung cancer Father   . COPD Sister      Review of Systems: A 12-system review of systems was performed and is negative except as noted in the HPI.  Labs: Recent Labs    05/29/18 0352 05/29/18 1025 05/29/18 1851 05/30/18 0206  TROPONINI 0.04* 0.04* 0.45* 27.73*   Lab Results  Component Value Date   WBC 19.0 (H) 05/30/2018   HGB 10.3 (L) 05/30/2018   HCT 31.2 (L) 05/30/2018   MCV 95.1 05/30/2018   PLT 207 05/30/2018    Recent Labs  Lab 05/30/18 0206  NA 139  K 4.3  CL 109  CO2 21*  BUN 37*  CREATININE 1.63*  CALCIUM 8.9  GLUCOSE 187*   No results found for: CHOL, HDL, LDLCALC, TRIG No results found for: DDIMER  Radiology/Studies:  Dg Chest Port 1 View  Result Date: 06/26/2018 CLINICAL DATA:  Hypoxia EXAM: PORTABLE CHEST 1 VIEW COMPARISON:  January 03, 2018 FINDINGS: Stable cardiomegaly. Stable pacemaker leads. The heart, hila, mediastinum, lungs, and pleura are otherwise unchanged and unremarkable with no acute abnormalities. IMPRESSION: No active disease. Electronically Signed   By: Gerome Sam III M.D   On: 06/26/2018 22:34   Dg C-arm 1-60 Min  Result Date: Jun 26, 2018 CLINICAL DATA:  84 year old male with peripheral arterial disease undergoing arteriography and intervention EXAM: DG C-ARM 61-120 MIN COMPARISON:  None. FINDINGS: A total of 14 intraoperative saved images are submitted for review. The images document balloon angioplasty of kissing common iliac artery stents, placement of a left external iliac artery stent and placement of a right external iliac artery stent. Of note, on the right to the superficial femoral artery is severely diseased and nearly occluded. IMPRESSION: Placement of kissing iliac stents and bilateral external iliac stents. The right superficial femoral artery is heavily diseased and nearly occluded.  Please see operative note for full detail. Electronically Signed   By: Malachy Moan M.D.   On: 06-26-18 12:59    Wt Readings  from Last 3 Encounters:  05/18/2018 95.8 kg  05/26/18 98 kg  05/08/18 97.7 kg    EKG: Ventricular paced rhythm  Physical Exam:  Blood pressure 99/64, pulse 86, temperature 98.1 F (36.7 C), temperature source Oral, resp. rate 20, height 6' (1.829 m), weight 95.8 kg, SpO2 93 %. Body mass index is 28.64 kg/m. General: Well developed, well nourished, in no acute distress. Head: Normocephalic, atraumatic, sclera non-icteric, no xanthomas, nares are without discharge.  Neck: Negative for carotid bruits. JVD not elevated. Lungs: Clear bilaterally to auscultation without wheezes, rales, or rhonchi. Breathing is unlabored. Heart: RRR with S1 S2. No murmurs, rubs, or gallops appreciated. Abdomen: Soft, non-tender, non-distended with normoactive bowel sounds. No hepatomegaly. No rebound/guarding. No obvious abdominal masses. Msk:  Strength and tone appear normal for age. Extremities: No clubbing or cyanosis. No edema.  Distal pedal pulses are 2+ and equal bilaterally. Neuro: Alert and oriented X 3. No facial asymmetry. No focal deficit. Moves all extremities spontaneously. Psych:  Responds to questions appropriately with a normal affect.     Assessment and Plan  84 year old male with history of multiple medical problems including ischemic cardia myopathy with ejection fraction 25 to 30% with severe three-vessel including left main coronary artery disease by cath in October 2018.  He was not a candidate for coronary artery bypass grafting due to a nonviable anteroapical wall by cardiac MRI and his comorbid medical conditions.  Medical management was recommended due to this.  His ejection fraction at that time was 25 to 30%.  He also had high-grade heart block and underwent a permanent pacemaker placement.  He also has severe peripheral vascular disease and due to rest  claudication pain underwent percutaneous lower extremity revascularization.  Postop developed some chest pain tachycardia and is ruled in for non-ST elevation myocardial infarction.  He is currently stable on heparin.  He is on low-dose Neo-Synephrine.  Not a candidate for relook invasive evaluation given chronicity and diffuse nature of his coronary disease by cath approximately 1 year ago.  Was not felt to be a candidate for percutaneous or surgical intervention at that time.  Would not recommend proceeding with relocked cardiac catheterization at this time unless he becomes extremely unstable due to the above.  Would continue with heparin for the next 24 hours.  Will attempt to wean Neo-Synephrine and attempt to add back beta-blocker therapy.  Signed, Dalia Heading MD 05/30/2018, 7:12 AM Pager: [phone]

## 2018-05-30 NOTE — Progress Notes (Signed)
Name: Justin Mendez MRN: [id number] DOB: [birth year]    ADMISSION DATE:  06-19-18 CONSULTATION DATE: 19-Jun-2018  REFERRING MD : Dr. Gilda Crease   CHIEF COMPLAINT: Bilateral lower extremity rest pain   BRIEF PATIENT DESCRIPTION:  84 yo male admitted s/p bilateral femoral endarterectomies with external iliac endarterectomies, bilateral common iliac artery stenting with stenting of the external iliac artery  SIGNIFICANT EVENTS  11/13-Pt underwent bilateral common femoral, superficial femoral and profunda femoris  endarterectomy with Cormatrix patch angioplasty. Bilateral external iliac artery endarterectomy with via bond stent placement to tack down the leading edge of the plaque. Bilateral common iliac artery angioplasty and stent placement using the kissing balloon technique with Dr. Wyn Quaker performing the stent from the right side and myself performing the left stent. Introduction catheter into aorta bilateral femoral approach with Dr. Wyn Quaker performing the right side and myself performing the left side. Right iliofemoral femoral angiography performed by Dr. Wyn Quaker. Left iliofemoral angiography 11/14: Patient was remaining hypotensive difficult to come off dopamine, complaint of chest pain initial troponins were okay, overnight troponins bumped up went up all the way more than 60, patient also had some PVCs so patient was placed on phenylephrine and dopamine was switched, spoke with the cardiology overnight, started on heparin drip, echo is repeated which showed similar finding of EF of 30% with anterior territory infarct  11/15: Doing well, denies any chest pain or other complaint, still on phenylephrine, blood pressure is 90/60, cardiology spoke with the family at length, patient is denying any other significant complaint  HISTORY OF PRESENT ILLNESS:   This is a 84 yo male with a PMH of OSA, Permanent Cardiac Pacemaker, PVD, Paroxysmal Atrial Fibrillation, PAD, Neuropathy, Ischemic Cardiomyopathy, HTN,  Hypercholesteremia, GERD, CAD, Osteoarthritis, Aortic Stenosis.  He has a history of severe atherosclerosis of bilateral lower extremities associated with lifestyle limiting claudication and rest pain.  He presented [facility] on 11/13 for bilateral femoral endarterectomies with external iliac endarterectomies, bilateral common iliac artery stenting with stenting of the external iliac artery.  He was admitted to ICU postop for additional workup and treatment   SUBJECTIVE:  As above, denies any significant complaint, mild bump in creatinine noted started on IV fluids  VITAL SIGNS: Temp:  [97.7 F (36.5 C)-98.6 F (37 C)] 98.6 F (37 C) (11/15 0830) Pulse Rate:  [31-103] 84 (11/15 0900) Resp:  [0-30] 30 (11/15 0900) BP: (69-128)/(42-115) 96/65 (11/15 0900) SpO2:  [91 %-100 %] 95 % (11/15 0900)  PHYSICAL EXAMINATION: General: well developed, well nourished male, NAD  Neuro: alert and oriented, follows commands HEENT: supple, no JVD  Cardiovascular: dual paced, no R/G Lungs: clear throughout, even, non labored  Abdomen: +BS x4, soft, non tender, non distended  Musculoskeletal: normal bulk and tone, no edema  Skin: bilateral groin incision sites honeycomb dressings intact no hematoma or bleeding, right groin ecchymosis present, bilateral lower extremities warm   Recent Labs  Lab June 19, 2018 2332 05/29/18 0450 05/30/18 0206  NA 140 141 139  K 4.6 4.4 4.3  CL 108 110 109  CO2 23 25 21*  BUN 22 23 37*  CREATININE 1.24 1.17 1.63*  GLUCOSE 144* 136* 187*   Recent Labs  Lab 06-19-18 2332 05/29/18 0450 05/29/18 1332 05/30/18 0206  HGB 12.3* 11.1*  --  10.3*  HCT 36.2* 32.6* 31.3* 31.2*  WBC 22.6* 21.3*  --  19.0*  PLT 224 213  --  207   Dg Chest Port 1 View  Result Date: 06-19-2018 CLINICAL DATA:  Hypoxia  EXAM: PORTABLE CHEST 1 VIEW COMPARISON:  January 03, 2018 FINDINGS: Stable cardiomegaly. Stable pacemaker leads. The heart, hila, mediastinum, lungs, and pleura are otherwise  unchanged and unremarkable with no acute abnormalities. IMPRESSION: No active disease. Electronically Signed   By: Gerome Samavid  Williams III M.D   On: 05/16/2018 22:34   Dg C-arm 1-60 Min  Result Date: 05/30/2018 CLINICAL DATA:  84 year old male with peripheral arterial disease undergoing arteriography and intervention EXAM: DG C-ARM 61-120 MIN COMPARISON:  None. FINDINGS: A total of 14 intraoperative saved images are submitted for review. The images document balloon angioplasty of kissing common iliac artery stents, placement of a left external iliac artery stent and placement of a right external iliac artery stent. Of note, on the right to the superficial femoral artery is severely diseased and nearly occluded. IMPRESSION: Placement of kissing iliac stents and bilateral external iliac stents. The right superficial femoral artery is heavily diseased and nearly occluded. Please see operative note for full detail. Electronically Signed   By: Malachy MoanHeath  McCullough M.D.   On: 06/10/2018 12:59   [place] Ekg Site Rite  Result Date: 05/30/2018 If Site Rite image not attached, placement could not be confirmed due to current cardiac rhythm.   ASSESSMENT / PLAN: Severe atherosclerosis of bilateral lower extremities with claudication and rest pain s/p bilateral femoral endarterectomies with external iliac endarterectomies, bilateral common iliac artery stenting with stenting of the external iliac artery-06/08/2018 Hypotension may be secondary to volume repletion versus non-STEMI Hx: CAD, HTN, Ischemic Cardiomyopathy, Hypercholesteremia, Paroxysmal Atrial Fibrillation, Permanent Pacemaker, and Aortic Stenosis Continuous telemetry monitoring  Cardiology consult called, spoke with them in person, official note is pending, echo was done did not reveal any significant worsening, - Continue aspirin/Plavix, atorvastatin, heparin- mostly medical management -Discussed with the patient at length -Had some PVCs and dopamine was  switched to phenylephrine, monitor potassium and magnesium keep potassium more than 4 keep magnesium more than 2 Hold outpatient antihypertensives for now Trend CBC  Monitor for s/sx of bleeding and transfuse for hgb <7 Trend WBC and monitor fever curve    Acute renal insufficiency: Most likely related to hypotension, started on LR fluids, monitor and adjust  Obstructive Sleep Apnea Supplemental O2 for dyspnea and/or hypoxia CPAP qhs   GERD Continue protonix   Postop pain Continue prn morphine and percocet for pain management   VTE px: eliquis on hold currently on heparin drip  Physical therapy consulted  Frequent OOB to chair is okay by cardiology  -Quality metrics discussed with nursing and in the rounds - PICC line order is placed -No Foley -GI DVT prophylaxis addressed skin is okay labs are addressed -CCM time 35 minutes

## 2018-05-30 NOTE — Progress Notes (Signed)
Pt's troponin has increased to 27.2 (previously 0.45).  Pt is currently sleeping, when awoken denies chest pain.  No changes in Phenylephrine requirements.  Called and spoke with Dr. Lady GaryFath of Cardiology regarding upward trend in Troponin.  Per Dr. Lady GaryFath, given pt's lack of chest pain and stable vasopressor requirements, no indication for urgent cath.  Continue heparin drip.     Harlon DittyJeremiah Aimar Shrewsbury, AGACNP-BC [facility] Pager: [phone] Cell: [phone]

## 2018-05-30 NOTE — Progress Notes (Signed)
Peripherally Inserted Central Catheter/Midline Placement  The IV Nurse has discussed with the patient and/or persons authorized to consent for the patient, the purpose of this procedure and the potential benefits and risks involved with this procedure.  The benefits include less needle sticks, lab draws from the catheter, and the patient may be discharged home with the catheter. Risks include, but not limited to, infection, bleeding, blood clot (thrombus formation), and puncture of an artery; nerve damage and irregular heartbeat and possibility to perform a PICC exchange if needed/ordered by physician.  Alternatives to this procedure were also discussed.  Bard Power PICC patient education guide, fact sheet on infection prevention and patient information card has been provided to patient /or left at bedside.    PICC/Midline Placement Documentation  PICC Double Lumen 05/30/18 PICC Right Basilic 41 cm 1 cm (Active)  Indication for Insertion or Continuance of Line Vasoactive infusions 05/30/2018 12:33 PM  Exposed Catheter (cm) 1 cm 05/30/2018 12:33 PM  Site Assessment Clean;Dry;Intact 05/30/2018 12:33 PM  Lumen #1 Status Flushed;Saline locked;Blood return noted 05/30/2018 12:33 PM  Lumen #2 Status Flushed;Saline locked;Blood return noted 05/30/2018 12:33 PM  Dressing Type Transparent 05/30/2018 12:33 PM  Dressing Status Clean;Dry;Intact 05/30/2018 12:33 PM  Dressing Intervention New dressing 05/30/2018 12:33 PM  Dressing Change Due 06/06/18 05/30/2018 12:33 PM       Aydn Ferrara, Lajean ManesKerry Loraine 05/30/2018, 12:34 PM

## 2018-05-31 ENCOUNTER — Inpatient Hospital Stay: Payer: Medicare Other

## 2018-05-31 DIAGNOSIS — R570 Cardiogenic shock: Secondary | ICD-10-CM

## 2018-05-31 LAB — CBC
HCT: 30.9 % — ABNORMAL LOW (ref 39.0–52.0)
HCT: 31.1 % — ABNORMAL LOW (ref 39.0–52.0)
HCT: 30.9 % — ABNORMAL LOW (ref 39.0–52.0)
HEMOGLOBIN: 10.1 g/dL — AB (ref 13.0–17.0)
HEMOGLOBIN: 10 g/dL — AB (ref 13.0–17.0)
Hemoglobin: 9.6 g/dL — ABNORMAL LOW (ref 13.0–17.0)
MCH: 32 pg (ref 26.0–34.0)
MCH: 31.7 pg (ref 26.0–34.0)
MCH: 32 pg (ref 26.0–34.0)
MCHC: 32.7 g/dL (ref 30.0–36.0)
MCHC: 32.2 g/dL (ref 30.0–36.0)
MCHC: 31.1 g/dL (ref 30.0–36.0)
MCV: 97.8 fL (ref 80.0–100.0)
MCV: 98.7 fL (ref 80.0–100.0)
MCV: 103 fL — AB (ref 80.0–100.0)
NRBC: 0.6 % — AB (ref 0.0–0.2)
PLATELETS: 188 10*3/uL (ref 150–400)
Platelets: 227 10*3/uL (ref 150–400)
Platelets: 224 10*3/uL (ref 150–400)
RBC: 3.16 MIL/uL — AB (ref 4.22–5.81)
RBC: 3.15 MIL/uL — AB (ref 4.22–5.81)
RBC: 3 MIL/uL — AB (ref 4.22–5.81)
RDW: 13.3 % (ref 11.5–15.5)
RDW: 13.7 % (ref 11.5–15.5)
RDW: 13.7 % (ref 11.5–15.5)
WBC: 20 10*3/uL — AB (ref 4.0–10.5)
WBC: 24.5 10*3/uL — ABNORMAL HIGH (ref 4.0–10.5)
WBC: 25.5 10*3/uL — ABNORMAL HIGH (ref 4.0–10.5)
nRBC: 0 % (ref 0.0–0.2)
nRBC: 0.3 % — ABNORMAL HIGH (ref 0.0–0.2)

## 2018-05-31 LAB — BLOOD GAS, ARTERIAL
ACID-BASE DEFICIT: 8.1 mmol/L — AB (ref 0.0–2.0)
BICARBONATE: 14.9 mmol/L — AB (ref 20.0–28.0)
DELIVERY SYSTEMS: POSITIVE
FIO2: 0.4
O2 Saturation: 99.4 %
Patient temperature: 37
pCO2 arterial: 23 mmHg — ABNORMAL LOW (ref 32.0–48.0)
pH, Arterial: 7.42 (ref 7.350–7.450)
pO2, Arterial: 155 mmHg — ABNORMAL HIGH (ref 83.0–108.0)

## 2018-05-31 LAB — BASIC METABOLIC PANEL
ANION GAP: 12 (ref 5–15)
Anion gap: 8 (ref 5–15)
BUN: 40 mg/dL — AB (ref 8–23)
BUN: 55 mg/dL — ABNORMAL HIGH (ref 8–23)
CHLORIDE: 106 mmol/L (ref 98–111)
CO2: 24 mmol/L (ref 22–32)
CO2: 18 mmol/L — AB (ref 22–32)
CREATININE: 1.71 mg/dL — AB (ref 0.61–1.24)
Calcium: 8.8 mg/dL — ABNORMAL LOW (ref 8.9–10.3)
Calcium: 8.6 mg/dL — ABNORMAL LOW (ref 8.9–10.3)
Chloride: 106 mmol/L (ref 98–111)
Creatinine, Ser: 2.43 mg/dL — ABNORMAL HIGH (ref 0.61–1.24)
GFR calc Af Amer: 27 mL/min — ABNORMAL LOW (ref >60)
GFR calc non Af Amer: 23 mL/min — ABNORMAL LOW (ref >60)
GFR, EST AFRICAN AMERICAN: 41 mL/min — AB (ref >60)
GFR, EST NON AFRICAN AMERICAN: 35 mL/min — AB (ref >60)
GLUCOSE: 131 mg/dL — AB (ref 70–99)
Glucose, Bld: 127 mg/dL — ABNORMAL HIGH (ref 70–99)
POTASSIUM: 4.6 mmol/L (ref 3.5–5.1)
POTASSIUM: 4.9 mmol/L (ref 3.5–5.1)
SODIUM: 138 mmol/L (ref 135–145)
SODIUM: 136 mmol/L (ref 135–145)

## 2018-05-31 LAB — COMPREHENSIVE METABOLIC PANEL
ALK PHOS: 39 U/L (ref 38–126)
ALT: 2704 U/L — AB (ref 0–44)
AST: 4343 U/L — AB (ref 15–41)
Albumin: 3.7 g/dL (ref 3.5–5.0)
Anion gap: 18 — ABNORMAL HIGH (ref 5–15)
BUN: 57 mg/dL — AB (ref 8–23)
CALCIUM: 8.5 mg/dL — AB (ref 8.9–10.3)
CO2: 13 mmol/L — AB (ref 22–32)
CREATININE: 2.73 mg/dL — AB (ref 0.61–1.24)
Chloride: 106 mmol/L (ref 98–111)
GFR calc Af Amer: 23 mL/min — ABNORMAL LOW (ref >60)
GFR calc non Af Amer: 20 mL/min — ABNORMAL LOW (ref >60)
Glucose, Bld: 157 mg/dL — ABNORMAL HIGH (ref 70–99)
Potassium: 4.7 mmol/L (ref 3.5–5.1)
SODIUM: 137 mmol/L (ref 135–145)
Total Bilirubin: 2.5 mg/dL — ABNORMAL HIGH (ref 0.3–1.2)
Total Protein: 6 g/dL — ABNORMAL LOW (ref 6.5–8.1)

## 2018-05-31 LAB — PROCALCITONIN
Procalcitonin: <0.1 ng/mL
Procalcitonin: 0.16 ng/mL

## 2018-05-31 LAB — HEPARIN LEVEL (UNFRACTIONATED)
Heparin Unfractionated: 0.74 IU/mL — ABNORMAL HIGH (ref 0.30–0.70)
Heparin Unfractionated: 0.82 IU/mL — ABNORMAL HIGH (ref 0.30–0.70)
Heparin Unfractionated: 0.39 IU/mL (ref 0.30–0.70)

## 2018-05-31 LAB — APTT
APTT: 126 s — AB (ref 24–36)
APTT: 93 s — AB (ref 24–36)
aPTT: 71 seconds — ABNORMAL HIGH (ref 24–36)

## 2018-05-31 LAB — MAGNESIUM
MAGNESIUM: 2.2 mg/dL (ref 1.7–2.4)
Magnesium: 2.4 mg/dL (ref 1.7–2.4)

## 2018-05-31 LAB — FIBRINOGEN: Fibrinogen: 439 mg/dL (ref 210–475)

## 2018-05-31 LAB — GLUCOSE, CAPILLARY
GLUCOSE-CAPILLARY: 124 mg/dL — AB (ref 70–99)
GLUCOSE-CAPILLARY: 113 mg/dL — AB (ref 70–99)

## 2018-05-31 LAB — TROPONIN I: Troponin I: 56.17 ng/mL (ref <0.03)

## 2018-05-31 LAB — LACTIC ACID, PLASMA: LACTIC ACID, VENOUS: 9.4 mmol/L — AB (ref 0.5–1.9)

## 2018-05-31 LAB — PHOSPHORUS: Phosphorus: 4.7 mg/dL — ABNORMAL HIGH (ref 2.5–4.6)

## 2018-05-31 MED ORDER — SODIUM CHLORIDE 0.9 % IV BOLUS
250.0000 mL | Freq: Once | INTRAVENOUS | Status: AC
  Administered 2018-05-31: 250 mL via INTRAVENOUS

## 2018-05-31 MED ORDER — STERILE WATER FOR INJECTION IV SOLN
INTRAVENOUS | Status: DC
  Administered 2018-05-31: 22:00:00 via INTRAVENOUS
  Filled 2018-05-31 (×3): qty 850

## 2018-05-31 MED ORDER — SODIUM CHLORIDE 0.9 % IV SOLN
0.0000 ug/min | INTRAVENOUS | Status: DC
  Administered 2018-05-31: 70 ug/min via INTRAVENOUS
  Filled 2018-05-31: qty 4
  Filled 2018-05-31: qty 40

## 2018-05-31 MED ORDER — DEXMEDETOMIDINE HCL IN NACL 400 MCG/100ML IV SOLN
0.0000 ug/kg/h | INTRAVENOUS | Status: DC
  Administered 2018-05-31: 0.8 ug/kg/h via INTRAVENOUS
  Administered 2018-06-01: 1.2 ug/kg/h via INTRAVENOUS
  Filled 2018-05-31: qty 100

## 2018-05-31 MED ORDER — FUROSEMIDE 10 MG/ML IJ SOLN
40.0000 mg | Freq: Once | INTRAMUSCULAR | Status: AC
  Administered 2018-05-31: 40 mg via INTRAVENOUS
  Filled 2018-05-31: qty 4

## 2018-05-31 MED ORDER — SODIUM BICARBONATE 8.4 % IV SOLN
150.0000 meq | Freq: Once | INTRAVENOUS | Status: AC
  Administered 2018-05-31: 150 meq via INTRAVENOUS
  Filled 2018-05-31: qty 50

## 2018-05-31 NOTE — Progress Notes (Signed)
ANTICOAGULATION CONSULT NOTE  Pharmacy Consult for Heparin drip Indication: chest pain/ACS  Allergies  Allergen Reactions  . Iodinated Diagnostic Agents Other (See Comments)     Was very "flushed", denies itching, swelling or hives. He states this was over 30 years ago with Ionic contrast and not the current non-ionic contrast. SPM    Patient Measurements: Height: 6' (182.9 cm) Weight: 211 lb 3.2 oz (95.8 kg) IBW/kg (Calculated) : 77.6 Heparin Dosing Weight: 95.8 kg  Vital Signs: Temp: 99.2 F (37.3 C) (11/16 0745) Temp Source: Axillary (11/16 0745) BP: 101/61 (11/16 0930) Pulse Rate: 60 (11/16 1000)  Labs: Recent Labs    05/29/18 0450  05/29/18 1332  05/30/18 0206 05/30/18 0716 05/30/18 1435 05/31/18 0106 05/31/18 0952  HGB 11.1*  --   --   --  10.3*  --   --  10.1*  --   HCT 32.6*  --  31.3*  --  31.2*  --   --  30.9*  --   PLT 213  --   --   --  207  --   --  188  --   APTT  --   --   --    < > 77*  --  96* 126* 93*  HEPARINUNFRC  --   --   --   --  0.75*  --   --  0.74* 0.82*  CREATININE 1.17  --   --   --  1.63*  --   --  1.71*  --   TROPONINI  --    < >  --    < > 27.73* >65.00* >65.00*  --   --    < > = values in this interval not displayed.    Estimated Creatinine Clearance: 39.3 mL/min (A) (by C-G formula based on SCr of 1.71 mg/dL (H)).   Medical History: Past Medical History:  Diagnosis Date  . Aortic stenosis   . Arthritis    osteoarthritis  . Coronary artery disease   . GERD (gastroesophageal reflux disease)   . H/O carotid endarterectomy   . Hearing loss   . High cholesterol   . Hypertension   . Ischemic cardiomyopathy   . Neuropathy   . PAD (peripheral artery disease) (HCC)   . Paroxysmal atrial fibrillation (HCC)   . Peripheral vascular disease (HCC)   . Presence of permanent cardiac pacemaker 01/03/2018   for 2nd degree AV block  . Sleep apnea     Medications:  Patient was taking Apixaban 5mg  BID prior to admission, patient has not  received any since admission on 05/29/2018.  Assessment: Patient is a 84yo male admitted for surgery, patient is s/p bilateral femoral endarterectomies with external iliac endarterectomies and bilateral common iliac artery stenting on 05/18/2018. Patient now with elevated troponins. Pharmacy consulted to begin Heparin drip with NO BOLUS.  Noted that patient did receive Lovenox 40mg  SQ this morning at 08:30.  Goal of Therapy:  Heparin level 0.3-0.7 units/ml aPTT 50 - 90 seconds Monitor platelets by anticoagulation protocol: Yes   Plan:  11/16 0106 HL 0.74, aPTT 126. Both Levels supratherapeutic. Rate never decreased with last supratherapeutic aPTT. Will decrease heparin rate to 1150 units/hr. Will recheck HL and aPTT in 8 hours.     11/16 ~ 10:00 HL 0.82, aPTT 93.  Decreased heparin drip rate to 950 units/hr.  Recheck HL/aPTT in 8 hours tonight at 19:00.    Pharmacy will continue to monitor and adjust per consult.   Justin Mendez  [facility] Clinical Pharmacist 05/31/2018 11:04 AM

## 2018-05-31 NOTE — Progress Notes (Signed)
3 Days Post-Op   Subjective/Chief Complaint: Noted now with Cardiorenal syndrome, hypotension, worsening renal function. Denies chest pain. Noted some leg discomfort earlier, controlled with current pain regimen.   Objective: Vital signs in last 24 hours: Temp:  [97.9 F (36.6 C)-99.2 F (37.3 C)] 99.2 F (37.3 C) (11/16 0745) Pulse Rate:  [29-142] 40 (11/16 0930) Resp:  [0-32] 28 (11/16 0930) BP: (73-120)/(48-100) 101/61 (11/16 0930) SpO2:  [85 %-100 %] 91 % (11/16 0930) Last BM Date: (pre op)  Intake/Output from previous day: 11/15 0701 - 11/16 0700 In: 2284.4 [P.O.:240; I.V.:2044.4] Out: 550 [Urine:550] Intake/Output this shift: Total I/O In: -  Out: 290 [Urine:290]  General appearance: alert and no distress Cardio: regular, paced Extremities: Bilateral groins with mild ecchymosis, soft, no hematoma, Bilateral biphasic pedal signals, warm  Lab Results:  Recent Labs    05/30/18 0206 05/31/18 0106  WBC 19.0* 20.0*  HGB 10.3* 10.1*  HCT 31.2* 30.9*  PLT 207 188   BMET Recent Labs    05/30/18 0206 05/31/18 0106  NA 139 138  K 4.3 4.6  CL 109 106  CO2 21* 24  GLUCOSE 187* 127*  BUN 37* 40*  CREATININE 1.63* 1.71*  CALCIUM 8.9 8.8*   PT/INR No results for input(s): LABPROT, INR in the last 72 hours. ABG No results for input(s): PHART, HCO3 in the last 72 hours.  Invalid input(s): PCO2, PO2  Studies/Results: Dg Chest Port 1 View  Result Date: 05/31/2018 CLINICAL DATA:  Dyspnea EXAM: PORTABLE CHEST 1 VIEW COMPARISON:  05/30/2018 FINDINGS: Cardiac shadow is mildly enlarged but stable. Pacing device is again seen and stable. Right-sided PICC line is noted in the mid superior vena cava new from the prior exam. Postsurgical changes in the cervical spine are seen. The lungs are well aerated bilaterally. No focal infiltrate or sizable effusion is seen. No acute bony abnormality is noted. IMPRESSION: PICC line in satisfactory position on the right. No acute  abnormality noted. Electronically Signed   By: Alcide Clever M.D.   On: 05/31/2018 07:27   Dg Chest Port 1 View  Result Date: 05/30/2018 CLINICAL DATA:  History of cardiomyopathy, paroxysmal atrial fibrillation, coronary artery disease, aortic stenosis. Status post bilateral femoral endarterectomy with iliac stent placement. EXAM: PORTABLE CHEST 1 VIEW COMPARISON:  Portable chest x-ray of 06/21/18 FINDINGS: The lungs remain hyperinflated. The interstitial markings are coarse. The pulmonary vascularity is slightly more prominent today. The cardiac silhouette is enlarged and also slightly more conspicuous. The ICD is in stable position. There is no pleural effusion or alveolar pneumonia. IMPRESSION: COPD. No acute pneumonia nor definite pulmonary edema. Mild enlargement of the cardiac silhouette with slight increased prominence of the central pulmonary vascularity may reflect low-grade compensated CHF. Electronically Signed   By: David  [place] M.D.   On: 05/30/2018 09:42   [place] Ekg Site Rite  Result Date: 05/30/2018 If Site Rite image not attached, placement could not be confirmed due to current cardiac rhythm.   Anti-infectives: Anti-infectives (From admission, onward)   Start     Dose/Rate Route Frequency Ordered Stop   June 21, 2018 1800  ceFAZolin (ANCEF) IVPB 2g/100 mL premix     2 g 200 mL/hr over 30 Minutes Intravenous Every 8 hours Jun 21, 2018 1748 05/29/18 0338   June 21, 2018 0600  ceFAZolin (ANCEF) IVPB 2g/100 mL premix     2 g 200 mL/hr over 30 Minutes Intravenous On call to O.R. 05/27/18 2146 2018/06/21 0805   2018-06-21 0550  ceFAZolin (ANCEF) 2-4 GM/100ML-% IVPB  Note to Pharmacy:  Mike CrazeHolmes, Stephen   : cabinet override      10/20/17 0550 10/20/17 0805      Assessment/Plan: s/p Procedure(s): ENDARTERECTOMY FEMORAL (Bilateral) INSERTION OF ILIAC STENT (Bilateral) Continue supportive care  Continue Heparin gtt  LOS: 3 days    Justin Mendez, Justin Mendez A 05/31/2018

## 2018-05-31 NOTE — Progress Notes (Signed)
Patient Name: Justin Mendez Date of Encounter: 05/31/2018  Hospital Problem List     Active Problems:   Atherosclerosis of native arteries of extremity with rest pain [facility])    Patient Profile      84 y.o. male with history of ischemic cardiomyopathy with ef of 25-30% with severe three vessel left main cad by cath in 05/07/17 who was not felt to be a candidate for cabg due to nonviable anterior wall by cardiac mri and comorbid medical conditions.and medical management was recommended. He had a dual chamber pacemaker placed due to complete heart block recently. He also has history of afib and has been treated with rate control and chronic anticoagulation. He is also s/[p left carotid endarterectomy, right carotid endarterectomy, history of iliac stents in the past who was recently admitted for bilateral lower extremity endarterectomy and covered stent of the distal aortic bifurcation. Post procedure he did well initially and then subsequently developed chest pain. EKG was ventricular paced. His chest pain improved with morphine. He was on dopamine initially and subsequently neo for hypotension. Initial troponin was 0.04 increased to 0.45 and subsequently 27.73 followed by greater than 60. Remains on higher dose neo this am.  Echocardiogram revealed an EF around 30% which is similar to his previous echo.  He has anteroapical akinesis on echo with ef of 25-30%. Base of heart is moving fairly well. Has rising creatinine.   Subjective  Somewhat short of breath with activity.  Mild chest fullness.  Left leg pain. Inpatient Medications    . aspirin  81 mg Oral Daily  . atorvastatin  40 mg Oral QHS  . clopidogrel  75 mg Oral Daily  . docusate sodium  100 mg Oral Daily  . escitalopram  10 mg Oral Daily  . guaiFENesin  600 mg Oral BID  . ipratropium  2 spray Nasal Daily  . magnesium oxide  400 mg Oral Daily  . multivitamin with minerals   Oral Daily  . pantoprazole  40 mg Oral Daily  . sodium  chloride flush  10-40 mL Intracatheter Q12H    Vital Signs    Vitals:   05/31/18 0824 05/31/18 0830 05/31/18 0845 05/31/18 0900  BP:  97/60 (!) 94/59 (!) 97/58  Pulse:    (!) 59  Resp: 16 (!) 29 19 (!) 31  Temp:      TempSrc:      SpO2:    100%  Weight:      Height:        Intake/Output Summary (Last 24 hours) at 05/31/2018 0927 Last data filed at 05/31/2018 0700 Gross per 24 hour  Intake 2091.85 ml  Output 550 ml  Net 1541.85 ml   Filed Weights   06/04/18 0613 June 04, 2018 1744  Weight: 97.9 kg 95.8 kg    Physical Exam    GEN: Well nourished, well developed, in no acute distress.  HEENT: normal.  Neck: Supple, no JVD, carotid bruits, or masses. Cardiac: RRR, no murmurs, rubs, or gallops. No clubbing, cyanosis, edema.  Respiratory:  Respirations regular and unlabored, clear to auscultation bilaterally. GI: Soft, nontender, nondistended, BS + x 4. MS: no deformity or atrophy. Skin: warm and dry, no rash. Neuro:  Strength and sensation are intact. Psych: Normal affect.  Labs    CBC Recent Labs    06-04-18 1840  05/30/18 0206 05/31/18 0106  WBC 15.1*   < > 19.0* 20.0*  NEUTROABS 13.7*  --   --   --   HGB  11.1*   < > 10.3* 10.1*  HCT 33.4*   < > 31.2* 30.9*  MCV 93.6   < > 95.1 97.8  PLT 182   < > 207 188   < > = values in this interval not displayed.   Basic Metabolic Panel Recent Labs    16/04/9610/15/19 0206 05/31/18 0106  NA 139 138  K 4.3 4.6  CL 109 106  CO2 21* 24  GLUCOSE 187* 127*  BUN 37* 40*  CREATININE 1.63* 1.71*  CALCIUM 8.9 8.8*  MG 2.3 2.2   Liver Function Tests No results for input(s): AST, ALT, ALKPHOS, BILITOT, PROT, ALBUMIN in the last 72 hours. No results for input(s): LIPASE, AMYLASE in the last 72 hours. Cardiac Enzymes Recent Labs    05/30/18 0206 05/30/18 0716 05/30/18 1435  TROPONINI 27.73* >65.00* >65.00*   BNP Recent Labs    2018-05-01 2332  BNP 572.0*   D-Dimer No results for input(s): DDIMER in the last 72  hours. Hemoglobin A1C No results for input(s): HGBA1C in the last 72 hours. Fasting Lipid Panel No results for input(s): CHOL, HDL, LDLCALC, TRIG, CHOLHDL, LDLDIRECT in the last 72 hours. Thyroid Function Tests No results for input(s): TSH, T4TOTAL, T3FREE, THYROIDAB in the last 72 hours.  Invalid input(s): FREET3  Telemetry    Ventricular paced rhythm at 60  ECG    Ventricular paced rhythm  Radiology    Dg Chest Port 1 View  Result Date: 05/31/2018 CLINICAL DATA:  Dyspnea EXAM: PORTABLE CHEST 1 VIEW COMPARISON:  05/30/2018 FINDINGS: Cardiac shadow is mildly enlarged but stable. Pacing device is again seen and stable. Right-sided PICC line is noted in the mid superior vena cava new from the prior exam. Postsurgical changes in the cervical spine are seen. The lungs are well aerated bilaterally. No focal infiltrate or sizable effusion is seen. No acute bony abnormality is noted. IMPRESSION: PICC line in satisfactory position on the right. No acute abnormality noted. Electronically Signed   By: Alcide CleverMark  Lukens M.D.   On: 05/31/2018 07:27   Dg Chest Port 1 View  Result Date: 05/30/2018 CLINICAL DATA:  History of cardiomyopathy, paroxysmal atrial fibrillation, coronary artery disease, aortic stenosis. Status post bilateral femoral endarterectomy with iliac stent placement. EXAM: PORTABLE CHEST 1 VIEW COMPARISON:  Portable chest x-ray of May 28, 2018 FINDINGS: The lungs remain hyperinflated. The interstitial markings are coarse. The pulmonary vascularity is slightly more prominent today. The cardiac silhouette is enlarged and also slightly more conspicuous. The ICD is in stable position. There is no pleural effusion or alveolar pneumonia. IMPRESSION: COPD. No acute pneumonia nor definite pulmonary edema. Mild enlargement of the cardiac silhouette with slight increased prominence of the central pulmonary vascularity may reflect low-grade compensated CHF. Electronically Signed   By: David   SwazilandJordan M.D.   On: 05/30/2018 09:42   Dg Chest Port 1 View  Result Date: February 07, 2018 CLINICAL DATA:  Hypoxia EXAM: PORTABLE CHEST 1 VIEW COMPARISON:  January 03, 2018 FINDINGS: Stable cardiomegaly. Stable pacemaker leads. The heart, hila, mediastinum, lungs, and pleura are otherwise unchanged and unremarkable with no acute abnormalities. IMPRESSION: No active disease. Electronically Signed   By: Gerome Samavid  Williams III M.D   On: February 07, 2018 22:34   Dg C-arm 1-60 Min  Result Date: February 07, 2018 CLINICAL DATA:  84 year old male with peripheral arterial disease undergoing arteriography and intervention EXAM: DG C-ARM 61-120 MIN COMPARISON:  None. FINDINGS: A total of 14 intraoperative saved images are submitted for review. The images document balloon angioplasty of  kissing common iliac artery stents, placement of a left external iliac artery stent and placement of a right external iliac artery stent. Of note, on the right to the superficial femoral artery is severely diseased and nearly occluded. IMPRESSION: Placement of kissing iliac stents and bilateral external iliac stents. The right superficial femoral artery is heavily diseased and nearly occluded. Please see operative note for full detail. Electronically Signed   By: Malachy Moan M.D.   On: 18-Jun-2018 12:59   [place] Ekg Site Rite  Result Date: 05/30/2018 If Site Rite image not attached, placement could not be confirmed due to current cardiac rhythm.   Assessment & Plan    Non-ST elevation myocardial infarction-ruled in for non-ST elevation myocardial infarction post lower extremity percutaneous intervention.  Patient was evaluated cardiac catheterization 1 year ago which showed severe three-vessel left main disease.  Anterior apical wall was akinetic.  Not felt to be a candidate for bypass surgery at [facility] due to diffuse disease as well as akinetic anterior apical wall.  There was also no evidence of viability in this wall by  MRI.  Continue with heparin.  Poor prognosis.  Would add beta-blocker when blood pressure allows.  Attempting to wean Neo-Synephrine.  Long discussion with patient and family.  Will attempt continue with medical management.  Renal insufficiency-appreciate nephrology input.  Will attempt to gently hydrate following renal function and urine output.  May need CRRT.  Lower extremity peripheral vascular disease-status post percutaneous intervention.  Has Doppler pulses.  Signed, Darlin Priestly Shyteria Lewis MD 05/31/2018, 9:27 AM  Pager: [phone]

## 2018-05-31 NOTE — Progress Notes (Signed)
CRITICAL CARE NOTE  CC  Cardiogenic shock   SUBJECTIVE Patient remains critically ill Prognosis is guarded On vasopressors Slight increased WOB Poor urine output  NSTEMI elevated troponin  Family at bedside updated      SIGNIFICANT EVENTS S/p Endarterectomy iliacs   BP 101/61   Pulse (!) 40   Temp 99.2 F (37.3 C) (Axillary)   Resp (!) 28   Ht 6' (1.829 m)   Wt 95.8 kg   SpO2 91%   BMI 28.64 kg/m    REVIEW OF SYSTEMS  PATIENT IS UNABLE TO PROVIDE COMPLETE REVIEW OF SYSTEMS DUE TO SEVERE CRITICAL ILLNESS   PHYSICAL EXAMINATION:  GENERAL:critically ill appearing, minimal resp distress HEAD: Normocephalic, atraumatic.  EYES: Pupils equal, round, reactive to light.  No scleral icterus.  MOUTH: Moist mucosal membrane. NECK: Supple. No thyromegaly. No nodules. No JVD.  PULMONARY: +rhonchi,  CARDIOVASCULAR: S1 and S2. Regular rate and rhythm. No murmurs, rubs, or gallops.  GASTROINTESTINAL: Soft, nontender, -distended. No masses. Positive bowel sounds. No hepatosplenomegaly.  MUSCULOSKELETAL: No swelling, clubbing, or edema.  NEUROLOGIC: alert and awake SKIN:intact,warm,dry      Indwelling Urinary Catheter continued, requirement due to   Reason to continue Indwelling Urinary Catheter for strict Intake/Output monitoring for hemodynamic instability   Central Line continued, requirement due to   Reason to continue [employer] Monitoring of central venous pressure or other hemodynamic parameters   Ventilator continued, requirement due to, resp failure    Ventilator Sedation RASS 0 to -2     ASSESSMENT AND PLAN SYNOPSIS  84 yo white male s/p endarterectomy b/l iliacs with acute NSTEMI with CHF and cardiogenic shock with acute renal failure.   Prognosis is poor based on fact that patient has severe CAD with CHF  Patient has progressive cardio-renal syndrome  Wean oxygen as needed   CARDIAC FAILURE-systolic CHF with cardiogenic  shock NSTEMI -follow up cardiac recs On heparin infusion Can NOT use lasix or beta blocker at this time   Renal Failure-most likely due to ATN -follow chem 7 -follow UO -continue Foley Catheter-assess need daily Nephrology consulted-may need HD   Cardiogenic  Shock/Hypovolumic shock Will try gentle fluid boluses to assess BP  -use vasopressors to keep MAP>65 -On NEO   CARDIAC ICU monitoring  GI/Nutrition GI PROPHYLAXIS as indicated DIET-->advanced diet as tolerated Constipation protocol as indicated   ELECTROLYTES -follow labs as needed -replace as needed -pharmacy consultation and following   DVT/GI PRX ordered TRANSFUSIONS AS NEEDED MONITOR FSBS ASSESS the need for LABS as needed   Critical Care Time devoted to patient care services described in this note is 36 minutes.   Overall, patient is critically ill, prognosis is guarded.  Patient with Multiorgan failure and at high risk for cardiac arrest and death.    Family updated and notified, prognosis is poor  Dailin Sosnowski Santiago Gladavid Mry Lamia, M.D.  [facility]  Medical Director [facility] Medical Director [facility]

## 2018-05-31 NOTE — Progress Notes (Addendum)
ANTICOAGULATION CONSULT NOTE - Initial Consult  Pharmacy Consult for Heparin drip Indication: chest pain/ACS  Allergies  Allergen Reactions  . Iodinated Diagnostic Agents Other (See Comments)     Was very "flushed", denies itching, swelling or hives. He states this was over 30 years ago with Ionic contrast and not the current non-ionic contrast. SPM    Patient Measurements: Height: 6' (182.9 cm) Weight: 211 lb 3.2 oz (95.8 kg) IBW/kg (Calculated) : 77.6 Heparin Dosing Weight: 95.8 kg  Vital Signs: Temp: 98.7 F (37.1 C) (11/16 0000) Temp Source: Axillary (11/16 0000) BP: 98/61 (11/16 0000) Pulse Rate: 44 (11/16 0000)  Labs: Recent Labs    06-27-2018 0649  06-27-18 2332  05/29/18 0450  05/29/18 1332  05/30/18 0206 05/30/18 0716 05/30/18 1435 05/31/18 0106  HGB  --    < > 12.3*  --  11.1*  --   --   --  10.3*  --   --  10.1*  HCT  --    < > 36.2*  --  32.6*  --  31.3*  --  31.2*  --   --  30.9*  PLT  --    < > 224  --  213  --   --   --  207  --   --  188  APTT  --   --   --   --   --   --   --   --  77*  --  96*  --   LABPROT 14.5  --   --   --   --   --   --   --   --   --   --   --   INR 1.14  --   --   --   --   --   --   --   --   --   --   --   HEPARINUNFRC  --   --   --   --   --   --   --   --  0.75*  --   --  0.74*  CREATININE  --    < > 1.24  --  1.17  --   --   --  1.63*  --   --   --   TROPONINI  --   --  0.04*   < >  --    < >  --    < > 27.73* >65.00* >65.00*  --    < > = values in this interval not displayed.    Estimated Creatinine Clearance: 41.2 mL/min (A) (by C-G formula based on SCr of 1.63 mg/dL (H)).   Medical History: Past Medical History:  Diagnosis Date  . Aortic stenosis   . Arthritis    osteoarthritis  . Coronary artery disease   . GERD (gastroesophageal reflux disease)   . H/O carotid endarterectomy   . Hearing loss   . High cholesterol   . Hypertension   . Ischemic cardiomyopathy   . Neuropathy   . PAD (peripheral artery  disease) (HCC)   . Paroxysmal atrial fibrillation (HCC)   . Peripheral vascular disease (HCC)   . Presence of permanent cardiac pacemaker 01/03/2018   for 2nd degree AV block  . Sleep apnea     Medications:  Patient was taking Apixaban 5mg  BID prior to admission, patient has not received any since admission on 06/27/18.  Assessment: Patient is a 84yo male admitted for  surgery, patient is s/p bilateral femoral endarterectomies with external iliac endarterectomies and bilateral common iliac artery stenting on 05/25/2018. Patient now with elevated troponins. Pharmacy consulted to begin Heparin drip with NO BOLUS.  Noted that patient did receive Lovenox 40mg  SQ this morning at 08:30.  Goal of Therapy:  Heparin level 0.3-0.7 units/ml aPTT 50 - 90 seconds Monitor platelets by anticoagulation protocol: Yes   Plan:  11/16 0106 HL 0.74, aPTT 126. Both Levels supratherapeutic. Rate never decreased with last supratherapeutic aPTT. Will decrease heparin rate to 1150 units/hr. Will recheck HL and aPTT in 8 hours.   Pharmacy will continue to monitor and adjust per consult.   Gardner CandleSheema M Naiara Lombardozzi, PharmD, BCPS Clinical Pharmacist 05/31/2018 1:38 AM

## 2018-05-31 NOTE — Progress Notes (Signed)
ANTICOAGULATION CONSULT NOTE  Pharmacy Consult for Heparin drip Indication: chest pain/ACS  Allergies  Allergen Reactions  . Iodinated Diagnostic Agents Other (See Comments)     Was very "flushed", denies itching, swelling or hives. He states this was over 30 years ago with Ionic contrast and not the current non-ionic contrast. SPM    Patient Measurements: Height: 6' (182.9 cm) Weight: 211 lb 3.2 oz (95.8 kg) IBW/kg (Calculated) : 77.6 Heparin Dosing Weight: 95.8 kg  Vital Signs: Temp: 98.7 F (37.1 C) (11/16 1615) Temp Source: Oral (11/16 1615) BP: 112/57 (11/16 1800) Pulse Rate: 119 (11/16 1800)  Labs: Recent Labs    05/30/18 0206 05/30/18 0716 05/30/18 1435 05/31/18 0106 05/31/18 0952 05/31/18 1759  HGB 10.3*  --   --  10.1*  --  10.0*  HCT 31.2*  --   --  30.9*  --  31.1*  PLT 207  --   --  188  --  227  APTT 77*  --  96* 126* 93* 71*  HEPARINUNFRC 0.75*  --   --  0.74* 0.82* 0.39  CREATININE 1.63*  --   --  1.71*  --  2.43*  TROPONINI 27.73* >65.00* >65.00*  --   --   --     Estimated Creatinine Clearance: 27.7 mL/min (A) (by C-G formula based on SCr of 2.43 mg/dL (H)).   Medical History: Past Medical History:  Diagnosis Date  . Aortic stenosis   . Arthritis    osteoarthritis  . Coronary artery disease   . GERD (gastroesophageal reflux disease)   . H/O carotid endarterectomy   . Hearing loss   . High cholesterol   . Hypertension   . Ischemic cardiomyopathy   . Neuropathy   . PAD (peripheral artery disease) (HCC)   . Paroxysmal atrial fibrillation (HCC)   . Peripheral vascular disease (HCC)   . Presence of permanent cardiac pacemaker 01/03/2018   for 2nd degree AV block  . Sleep apnea     Medications:  Patient was taking Apixaban 5mg  BID prior to admission, patient has not received any since admission on 07/10/2018.  Assessment: Patient is a 84yo male admitted for surgery, patient is s/p bilateral femoral endarterectomies with external iliac  endarterectomies and bilateral common iliac artery stenting on 07/10/2018. Patient now with elevated troponins. Pharmacy consulted to begin Heparin drip with NO BOLUS.  11/16 0106 HL 0.74, aPTT 126. Both Levels supratherapeutic. Rate never decreased with last supratherapeutic aPTT. Will decrease heparin rate to 1150 units/hr. Will recheck HL and aPTT in 8 hours.     11/16 ~ 10:00 HL 0.82, aPTT 93.  Decreased heparin drip rate to 950 units/hr. Noted that patient did receive Lovenox 40mg  SQ at 11/16 08:30.  11/16 1759 HL 0.39 and aPTT 71- Both therapeutic, will continue current dose and will switch to monitoring heparin level only.   Goal of Therapy:  Heparin level 0.3-0.7 units/ml aPTT 50 - 90 seconds Monitor platelets by anticoagulation protocol: Yes   Plan:  Will continue current rate (950 units/hr). Will order a 8 hour HL.   Pharmacy will continue to monitor and adjust per consult.   Ronnald RampKishan S Esgar Barnick, PharmD Clinical Pharmacist 05/31/2018 7:57 PM

## 2018-05-31 NOTE — Consult Note (Signed)
[facility] CONSULT NOTE    Date: 05/31/2018                  Patient Name:  Justin Mendez  MRN: [id number]  DOB: May 21, 1933  Age / Sex: 84 y.o., male         PCP: Rusty Aus, MD                 Service Requesting Consult: Critical care                 Reason for Consult: Acute renal failure            History of Present Illness: Patient is a 84 y.o. male with a PMHx of ischemic cardiomyopathy ejection fraction 25 to 30%, severe three-vessel left main coronary artery disease, aortic stenosis, GERD, hearing loss, peripheral neuropathy, peripheral arterial disease, paroxysmal atrial fibrillation, second-degree AV block status post pacemaker placement, obstructive sleep apnea who was admitted to [facility] on 06/03/2018 for treatment of peripheral arterial disease.  He underwent bilateral common femoral, superficial femoral, and profunda femoris endarterectomy with patch angioplasty.  He also had angiography performed with 50 cc of contrast administered.  Unfortunately he subsequently developed a myocardial infarction with a troponin currently greater than 65.  Cardiology has been consulted.  No plans for immediate cardiac catheterization.  He is also developed worsening acute renal failure now.  Blood pressure low at 77/56.  Urine output was 550 cc over the preceding 24 hours.  Patient currently denies significant shortness of breath.   Medications: Outpatient medications: Medications Prior to Admission  Medication Sig Dispense Refill Last Dose  . atorvastatin (LIPITOR) 80 MG tablet Take 40 mg by mouth at bedtime.   2 05/27/2018 at Unknown time  . clopidogrel (PLAVIX) 75 MG tablet Take 75 mg by mouth daily.   2 05/27/2018 at Unknown time  . clotrimazole-betamethasone (LOTRISONE) cream Apply 2 application topically as needed (itching).   1 05/27/2018 at Unknown time  . COENZYME Q10 PO Take 10 mg by mouth daily.   Past Week at Unknown time  . ELIQUIS 5 MG TABS tablet  Take 1 tablet (5 mg total) by mouth 2 (two) times daily. 60 tablet 11 Past Week at Unknown time  . escitalopram (LEXAPRO) 10 MG tablet Take 10 mg by mouth daily.    06/08/2018 at Unknown time  . fluticasone (FLONASE) 50 MCG/ACT nasal spray Place 2 sprays into both nostrils daily as needed (congestion).   2 05/31/2018 at Unknown time  . ipratropium (ATROVENT) 0.06 % nasal spray Place 2 sprays into the nose daily.    05/27/2018 at Unknown time  . isosorbide mononitrate (IMDUR) 30 MG 24 hr tablet Take 30 mg by mouth daily.   2 06/14/2018 at Unknown time  . magnesium oxide (MAG-OX) 400 MG tablet Take 400 mg by mouth daily.   05/27/2018 at Unknown time  . metoprolol succinate (TOPROL-XL) 50 MG 24 hr tablet Take 1 tablet (50 mg total) by mouth 2 (two) times daily. Take with or immediately following a meal. 60 tablet 0 06/13/2018 at Unknown time  . Multiple Vitamin (MULTI-VITAMINS) TABS Take 1 tablet by mouth daily.    Past Week at Unknown time  . olmesartan (BENICAR) 20 MG tablet Take 10 mg by mouth every evening.    05/27/2018 at Unknown time  . pantoprazole (PROTONIX) 40 MG tablet Take 40 mg by mouth daily.  2 06/04/2018 at Unknown time  . spironolactone (ALDACTONE) 25 MG  tablet Take 12.5 mg by mouth daily.   2 05/27/2018 at Unknown time  . nitroGLYCERIN (NITROSTAT) 0.4 MG SL tablet Place 0.4 mg under the tongue every 5 (five) minutes as needed for chest pain.   Not Taking at Unknown time    Current medications: Current Facility-Administered Medications  Medication Dose Route Frequency Provider Last Rate Last Dose  . acetaminophen (TYLENOL) tablet 325-650 mg  325-650 mg Oral Q4H PRN Schnier, Dolores Lory, MD       Or  . acetaminophen (TYLENOL) suppository 325-650 mg  325-650 mg Rectal Q4H PRN Schnier, Dolores Lory, MD      . alum & mag hydroxide-simeth (MAALOX/MYLANTA) 200-200-20 MG/5ML suspension 15-30 mL  15-30 mL Oral Q2H PRN Schnier, Dolores Lory, MD      . aspirin chewable tablet 81 mg  81 mg Oral Daily  Lahoma Rocker, MD   81 mg at 05/30/18 1026  . atorvastatin (LIPITOR) tablet 40 mg  40 mg Oral QHS Schnier, Dolores Lory, MD   40 mg at 05/30/18 2100  . clopidogrel (PLAVIX) tablet 75 mg  75 mg Oral Daily Schnier, Dolores Lory, MD   75 mg at 05/30/18 1024  . docusate sodium (COLACE) capsule 100 mg  100 mg Oral Daily Schnier, Dolores Lory, MD   100 mg at 05/30/18 1024  . escitalopram (LEXAPRO) tablet 10 mg  10 mg Oral Daily Schnier, Dolores Lory, MD   10 mg at 05/30/18 1025  . fluticasone (FLONASE) 50 MCG/ACT nasal spray 2 spray  2 spray Each Nare Daily PRN Schnier, Dolores Lory, MD      . guaiFENesin (MUCINEX) 12 hr tablet 600 mg  600 mg Oral BID Darel Hong D, NP   600 mg at 05/30/18 2100  . guaiFENesin-dextromethorphan (ROBITUSSIN DM) 100-10 MG/5ML syrup 15 mL  15 mL Oral Q4H PRN Schnier, Dolores Lory, MD      . heparin ADULT infusion 100 units/mL (25000 units/260m sodium chloride 0.45%)  1,150 Units/hr Intravenous Continuous Hallaji, Sheema M, RPH 11.5 mL/hr at 05/31/18 0500 1,150 Units/hr at 05/31/18 0500  . ipratropium (ATROVENT) 0.06 % nasal spray 2 spray  2 spray Nasal Daily Schnier, GDolores Lory MD   2 spray at 05/29/18 [phone] . lactated ringers infusion   Intravenous Continuous KBradly Bienenstock NP 75 mL/hr at 05/31/18 0500    . magnesium oxide (MAG-OX) tablet 400 mg  400 mg Oral Daily Schnier, GDolores Lory MD   400 mg at 05/29/18 0830  . magnesium sulfate IVPB 2 g 50 mL  2 g Intravenous Daily PRN Schnier, GDolores Lory MD      . morphine 2 MG/ML injection 1 mg  1 mg Intravenous Q4H PRN SLahoma Rocker MD   1 mg at 05/29/18 1911  . multivitamin with minerals tablet   Oral Daily Schnier, GDolores Lory MD   1 tablet at 05/29/18 0830  . nitroGLYCERIN (NITROSTAT) SL tablet 0.4 mg  0.4 mg Sublingual Q5 min PRN Schnier, GDolores Lory MD   0.4 mg at 05/29/18 2125  . ondansetron (ZOFRAN) injection 4 mg  4 mg Intravenous Q6H PRN Schnier, GDolores Lory MD      . oxyCODONE-acetaminophen (PERCOCET/ROXICET) 5-325 MG per tablet 1-2 tablet   1-2 tablet Oral Q4H PRN Schnier, GDolores Lory MD   1 tablet at 05/31/18 [phone] . pantoprazole (PROTONIX) EC tablet 40 mg  40 mg Oral Daily Schnier, GDolores Lory MD   40 mg at 05/30/18 1025  . phenol (CHLORASEPTIC) mouth spray 1 spray  1 spray Mouth/Throat PRN Schnier, Dolores Lory, MD      . phenylephrine (NEOSYNEPHRINE) 10-0.9 MG/250ML-% infusion  0-400 mcg/min Intravenous Titrated Awilda Bill, NP 30 mL/hr at 05/31/18 0621 20 mcg/min at 05/31/18 0621  . polyvinyl alcohol (LIQUIFILM TEARS) 1.4 % ophthalmic solution 1 drop  1 drop Both Eyes QID PRN Lahoma Rocker, MD      . potassium chloride SA (K-DUR,KLOR-CON) CR tablet 20-40 mEq  20-40 mEq Oral Daily PRN Schnier, Dolores Lory, MD      . senna-docusate (Senokot-S) tablet 1 tablet  1 tablet Oral QHS PRN Schnier, Dolores Lory, MD      . sodium chloride flush (NS) 0.9 % injection 10-40 mL  10-40 mL Intracatheter Q12H Lahoma Rocker, MD   10 mL at 05/30/18 2105  . sodium chloride flush (NS) 0.9 % injection 10-40 mL  10-40 mL Intracatheter PRN Lahoma Rocker, MD      . sorbitol 70 % solution 30 mL  30 mL Oral Daily PRN Schnier, Dolores Lory, MD          Allergies: Allergies  Allergen Reactions  . Iodinated Diagnostic Agents Other (See Comments)     Was very "flushed", denies itching, swelling or hives. He states this was over 30 years ago with Ionic contrast and not the current non-ionic contrast. SPM      Past Medical History: Past Medical History:  Diagnosis Date  . Aortic stenosis   . Arthritis    osteoarthritis  . Coronary artery disease   . GERD (gastroesophageal reflux disease)   . H/O carotid endarterectomy   . Hearing loss   . High cholesterol   . Hypertension   . Ischemic cardiomyopathy   . Neuropathy   . PAD (peripheral artery disease) ([place])   . Paroxysmal atrial fibrillation ([facility])   . Peripheral vascular disease ([place])   . Presence of permanent cardiac pacemaker 01/03/2018   for 2nd degree AV block  . Sleep apnea      Past Surgical  History: Past Surgical History:  Procedure Laterality Date  . CARDIAC CATHETERIZATION  05/07/2017  . carotid endarderectomy Bilateral 11/2001  . ENDARTERECTOMY FEMORAL Bilateral 05/27/2018   Procedure: ENDARTERECTOMY FEMORAL;  Surgeon: Katha Cabal, MD;  Location: [facility] ORS;  Service: Vascular;  Laterality: Bilateral;  . INSERT / REPLACE / REMOVE PACEMAKER    . INSERTION OF ILIAC STENT Bilateral 06/03/2018   Procedure: INSERTION OF ILIAC STENT;  Surgeon: Katha Cabal, MD;  Location: [facility] ORS;  Service: Vascular;  Laterality: Bilateral;  . JOINT REPLACEMENT    . KNEE SURGERY     Left  . LOWER EXTREMITY ANGIOGRAPHY Right 04/29/2018   Procedure: LOWER EXTREMITY ANGIOGRAPHY;  Surgeon: Katha Cabal, MD;  Location: [place] [facility];  Service: Cardiovascular;  Laterality: Right;  . MANDIBLE SURGERY    . NECK SURGERY     plate inserted  . PACEMAKER INSERTION Left 01/03/2018   Procedure: INSERTION PACEMAKER-INITIAL DUAL CHAMBER;  Surgeon: Isaias Cowman, MD;  Location: [facility] ORS;  Service: Cardiovascular;  Laterality: Left;  . rectal fistula surgery     x 3 or 4  . REPLACEMENT TOTAL KNEE BILATERAL Bilateral    left 08/2011; right 04/2016  . RIGHT/LEFT HEART CATH AND CORONARY ANGIOGRAPHY N/A 04/29/2017   Procedure: RIGHT/LEFT HEART CATH AND CORONARY ANGIOGRAPHY;  Surgeon: Isaias Cowman, MD;  Location: [place] [facility];  Service: Cardiovascular;  Laterality: N/A;     Family History: Family History  Problem Relation Age of Onset  .  Lung cancer Father   . COPD Sister      Social History: Social History   Socioeconomic History  . Marital status: Married    Spouse name: Not on file  . Number of children: Not on file  . Years of education: Not on file  . Highest education level: Not on file  Occupational History  . Not on file  Social Needs  . Financial resource strain: Not on file  . Food insecurity:    Worry: Not on file    Inability: Not on  file  . Transportation needs:    Medical: Not on file    Non-medical: Not on file  Tobacco Use  . Smoking status: Former Smoker    Types: Cigarettes    Last attempt to quit: 08/31/1984    Years since quitting: 33.7  . Smokeless tobacco: Never Used  Substance and Sexual Activity  . Alcohol use: Not Currently  . Drug use: No  . Sexual activity: Not on file  Lifestyle  . Physical activity:    Days per week: Not on file    Minutes per session: Not on file  . Stress: Not on file  Relationships  . Social connections:    Talks on phone: Not on file    Gets together: Not on file    Attends religious service: Not on file    Active member of club or organization: Not on file    Attends meetings of clubs or organizations: Not on file    Relationship status: Not on file  . Intimate partner violence:    Fear of current or ex partner: Not on file    Emotionally abused: Not on file    Physically abused: Not on file    Forced sexual activity: Not on file  Other Topics Concern  . Not on file  Social History Narrative  . Not on file     Review of Systems: Review of Systems  Constitutional: Negative for chills, fever and malaise/fatigue.  HENT: Positive for hearing loss. Negative for congestion and sinus pain.   Eyes: Negative for blurred vision and double vision.  Respiratory: Negative for cough, hemoptysis and sputum production.   Cardiovascular: Negative for chest pain, palpitations and leg swelling.  Gastrointestinal: Negative for heartburn, nausea and vomiting.  Genitourinary: Negative for dysuria, frequency and urgency.  Musculoskeletal: Negative for joint pain and myalgias.  Skin: Negative for itching and rash.  Neurological: Negative for dizziness and focal weakness.  Endo/Heme/Allergies: Negative for polydipsia. Does not bruise/bleed easily.  Psychiatric/Behavioral: Negative for depression. The patient is not nervous/anxious.      Vital Signs: Blood pressure (!) 77/56,  pulse 60, temperature 99.2 F (37.3 C), temperature source Axillary, resp. rate 16, height 6' (1.829 m), weight 95.8 kg, SpO2 100 %.  Weight trends: Filed Weights   06/14/2018 0613 06/04/2018 1744  Weight: 97.9 kg 95.8 kg    Physical Exam: General: NAD, resting in bed  Head: Normocephalic, atraumatic.  Eyes: Anicteric, EOMI  Nose: Mucous membranes moist, not inflammed, nonerythematous.  Throat: Oral mucosa moist  Neck: Supple, trachea midline.  Lungs:  Normal respiratory effort. Clear to auscultation BL without crackles or wheezes.  Heart: S1S2 paced rhythm on monitor  Abdomen:  BS normoactive. Soft, Nondistended, non-tender.  No masses or organomegaly.  Extremities: No pretibial edema.  Neurologic: A&O X3, follows commands  Skin: No visible rashes, scars.    Lab results: Basic Metabolic Panel: Recent Labs  Lab 05/29/18 0450 05/30/18 0206 05/31/18  0106  NA 141 139 138  K 4.4 4.3 4.6  CL 110 109 106  CO2 25 21* 24  GLUCOSE 136* 187* 127*  BUN 23 37* 40*  CREATININE 1.17 1.63* 1.71*  CALCIUM 8.8* 8.9 8.8*  MG  --  2.3 2.2    Liver Function Tests: No results for input(s): AST, ALT, ALKPHOS, BILITOT, PROT, ALBUMIN in the last 168 hours. No results for input(s): LIPASE, AMYLASE in the last 168 hours. No results for input(s): AMMONIA in the last 168 hours.  CBC: Recent Labs  Lab 05/26/18 1509  06/06/2018 1840 06/14/2018 2332 05/29/18 0450 05/29/18 1332 05/30/18 0206 05/31/18 0106  WBC 8.3   < > 15.1* 22.6* 21.3*  --  19.0* 20.0*  NEUTROABS 5.2  --  13.7*  --   --   --   --   --   HGB 13.7   < > 11.1* 12.3* 11.1*  --  10.3* 10.1*  HCT 40.0   < > 33.4* 36.2* 32.6* 31.3* 31.2* 30.9*  MCV 92.8   < > 93.6 94.0 92.6  --  95.1 97.8  PLT 217   < > 182 224 213  --  207 188   < > = values in this interval not displayed.    Cardiac Enzymes: Recent Labs  Lab 05/29/18 1025 05/29/18 1851 05/30/18 0206 05/30/18 0716 05/30/18 1435  TROPONINI 0.04* 0.45* 27.73* >65.00*  >65.00*    BNP: Invalid input(s): POCBNP  CBG: Recent Labs  Lab 05/25/2018 1736 05/30/18 1942 05/31/18 0738  GLUCAP 135* 112* 43*    Microbiology: Results for orders placed or performed during the hospital encounter of 06/12/2018  MRSA PCR Screening     Status: None   Collection Time: 05/23/2018  9:18 PM  Result Value Ref Range Status   MRSA by PCR NEGATIVE NEGATIVE Final    Comment:        The GeneXpert MRSA Assay (FDA approved for NASAL specimens only), is one component of a comprehensive MRSA colonization surveillance program. It is not intended to diagnose MRSA infection nor to guide or monitor treatment for MRSA infections. Performed at [facility], [address] [place], [place] [address]     Coagulation Studies: No results for input(s): LABPROT, INR in the last 72 hours.  Urinalysis: No results for input(s): COLORURINE, LABSPEC, PHURINE, GLUCOSEU, HGBUR, BILIRUBINUR, KETONESUR, PROTEINUR, UROBILINOGEN, NITRITE, LEUKOCYTESUR in the last 72 hours.  Invalid input(s): APPERANCEUR    Imaging: Dg Chest Port 1 View  Result Date: 05/31/2018 CLINICAL DATA:  Dyspnea EXAM: PORTABLE CHEST 1 VIEW COMPARISON:  05/30/2018 FINDINGS: Cardiac shadow is mildly enlarged but stable. Pacing device is again seen and stable. Right-sided PICC line is noted in the mid superior vena cava new from the prior exam. Postsurgical changes in the cervical spine are seen. The lungs are well aerated bilaterally. No focal infiltrate or sizable effusion is seen. No acute bony abnormality is noted. IMPRESSION: PICC line in satisfactory position on the right. No acute abnormality noted. Electronically Signed   By: Inez Catalina M.D.   On: 05/31/2018 07:27   Dg Chest Port 1 View  Result Date: 05/30/2018 CLINICAL DATA:  History of cardiomyopathy, paroxysmal atrial fibrillation, coronary artery disease, aortic stenosis. Status post bilateral femoral endarterectomy with iliac stent placement.  EXAM: PORTABLE CHEST 1 VIEW COMPARISON:  Portable chest x-ray of May 28, 2018 FINDINGS: The lungs remain hyperinflated. The interstitial markings are coarse. The pulmonary vascularity is slightly more prominent today. The cardiac silhouette  is enlarged and also slightly more conspicuous. The ICD is in stable position. There is no pleural effusion or alveolar pneumonia. IMPRESSION: COPD. No acute pneumonia nor definite pulmonary edema. Mild enlargement of the cardiac silhouette with slight increased prominence of the central pulmonary vascularity may reflect low-grade compensated CHF. Electronically Signed   By: David  Martinique M.D.   On: 05/30/2018 09:42   [place] Ekg Site Rite  Result Date: 05/30/2018 If Site Rite image not attached, placement could not be confirmed due to current cardiac rhythm.     Assessment & Plan: Pt is a 84 y.o. male with a PMHx of ischemic cardiomyopathy ejection fraction 25 to 30%, severe three-vessel left main coronary artery disease, aortic stenosis, GERD, hearing loss, peripheral neuropathy, peripheral arterial disease, paroxysmal atrial fibrillation, second-degree AV block status post pacemaker placement, obstructive sleep apnea who was admitted to [facility] on 06/13/2018 for treatment of peripheral arterial disease.  He underwent bilateral common femoral, superficial femoral, and profunda femoris endarterectomy with patch angioplasty.  1.  Acute renal failure secondary to cardiorenal syndrome.  Patient also was administered contrast during angiography. 2.  Chronic kidney disease stage III baseline Cr 1.17 with egfr of 57.  3.  Anemia of blood loss.  4.  Hypotension, suspect cardiogenic.   Plan:  We were asked to see the patient for evaluation management of acute renal failure of known chronic kidney disease stage III with a baseline creatinine 1.17 and EGFR 56.  He underwent endarterectomy as above.  He did receive 50 cc of contrast.  Thereafter he developed acute  myocardial infarction with troponin greater than 65 in the setting of known heart failure.  Suspect that he has cardiorenal syndrome now.  Urine output was 550 cc over the preceding 24 hours.  He is also hypotensive.  There is continued risk that renal function may continue to deteriorate.  We talked about the potential for renal replacement therapy in this setting.  It appears that patient is willing to proceed if renal function continues to deteriorate.  No immediate plans for cardiac catheterization given worsening acute renal failure.  Further plan as patient progresses.  Thanks for consultation.

## 2018-05-31 NOTE — Progress Notes (Signed)
Called to the bedside at about 2100 to evaluate patient for acute agitation. Briefly, this is an 84 y/o male with ischemic cardiomyopathy with an EF of 25-30%, severe three vessel CAD not amenable to CABG due to nonviable anterior wall, CHB s/p pacemaker, and severe PVD/PAD who underwent a bilateral femoral endarterectomies with external iliac endarterectomies, bilateral common iliac artery stenting with stenting of the external iliac artery on 05/20/2018. Post procedure, he developed chest pain. His troponin peaked at >65. He was seen by cardiology and started on a heparin infusion.  Upon arrival at the bedside, patient was severely agitated and attempting to get out of bed and also c/o difficulty breathing. Patient was assisted back to bed and started on precedex. STAT labs revealed severe metabolic acidosis and worsening creatinine level.    ROS: unable to obtain due to agitation and confusion  Constitutional:Confused and disoriented HENT: Hard of hearing, PERRLA, trachea midline.  Cardiovascular: AP irregular, Paced rhythm, no MRG, +1 edema, +2 pulses   Pulmonary/Chest: Bilateral breath sounds without wheezes or wrhonchi Abdominal: Obese, normal bowel sounds, no organomegaly on palpation Musculoskeletal:+rom, no deformities Neurological: awake, agitated and confused, moves all extremities Skin: no diaphoresis  Assessment 1. Acute delirium-likely due to a combination of ICU delirium and acidosis 2. Severe metabolic acidosis 3. NSTEMI 4. Cardiogenic shock 5. Severe cardiorenal syndrome with worsening creatinine and cardiogenic shock  Plan -STAT labs-reviewed -STAT ABG-reviewed -Bicarb 150 mEq IV X1 and then relay with continuous infusion at 6775ml/hr -STAT CXR reviewed-mild pulmonary edema -Lasix 40mg  iv x1 -Precedex infusion, titrate up to 0.38mcg for comfort -Repeat ABG at 23:30pm. Based on repeat ABG, if no improvement in acidosis, will proceed with intubation. - Nephrology is  following patient already and plan is to proceed with CRRT if renal function continues to decline.  Case discussed with ICU attending Dr Belia HemanKasa and Dr Evie LacksEsco from vascular surgery.  Patient's wife and son in law updated at bedside.  Will continue to monitor   Magdalene S. [facility] ANP-BC Pulmonary and Critical Care Medicine [facility] Pager [phone] or [phone]  NB: This document was prepared using Dragon voice recognition software and may include unintentional dictation errors.

## 2018-06-01 ENCOUNTER — Inpatient Hospital Stay: Payer: Medicare Other

## 2018-06-01 DIAGNOSIS — J9600 Acute respiratory failure, unspecified whether with hypoxia or hypercapnia: Secondary | ICD-10-CM

## 2018-06-01 DIAGNOSIS — N170 Acute kidney failure with tubular necrosis: Secondary | ICD-10-CM

## 2018-06-01 DIAGNOSIS — I214 Non-ST elevation (NSTEMI) myocardial infarction: Secondary | ICD-10-CM

## 2018-06-01 DIAGNOSIS — N19 Unspecified kidney failure: Secondary | ICD-10-CM

## 2018-06-01 DIAGNOSIS — R570 Cardiogenic shock: Secondary | ICD-10-CM

## 2018-06-01 DIAGNOSIS — J9601 Acute respiratory failure with hypoxia: Secondary | ICD-10-CM

## 2018-06-01 LAB — BLOOD GAS, ARTERIAL
Acid-base deficit: 8 mmol/L — ABNORMAL HIGH (ref 0.0–2.0)
Bicarbonate: 17.7 mmol/L — ABNORMAL LOW (ref 20.0–28.0)
FIO2: 1
O2 Saturation: 99.6 %
PEEP: 5 cmH2O
Patient temperature: 37
RATE: 16 resp/min
VT: 500 mL
pCO2 arterial: 36 mmHg (ref 32.0–48.0)
pH, Arterial: 7.3 — ABNORMAL LOW (ref 7.350–7.450)
pO2, Arterial: 190 mmHg — ABNORMAL HIGH (ref 83.0–108.0)

## 2018-06-01 LAB — RENAL FUNCTION PANEL
ALBUMIN: 3.6 g/dL (ref 3.5–5.0)
ANION GAP: 15 (ref 5–15)
BUN: 63 mg/dL — ABNORMAL HIGH (ref 8–23)
CO2: 22 mmol/L (ref 22–32)
Calcium: 8.5 mg/dL — ABNORMAL LOW (ref 8.9–10.3)
Chloride: 104 mmol/L (ref 98–111)
Creatinine, Ser: 3.01 mg/dL — ABNORMAL HIGH (ref 0.61–1.24)
GFR calc Af Amer: 21 mL/min — ABNORMAL LOW (ref >60)
GFR, EST NON AFRICAN AMERICAN: 18 mL/min — AB (ref >60)
Glucose, Bld: 67 mg/dL — ABNORMAL LOW (ref 70–99)
PHOSPHORUS: 4.6 mg/dL (ref 2.5–4.6)
POTASSIUM: 5.3 mmol/L — AB (ref 3.5–5.1)
Sodium: 141 mmol/L (ref 135–145)

## 2018-06-01 LAB — CBC
HCT: 31.9 % — ABNORMAL LOW (ref 39.0–52.0)
Hemoglobin: 10.2 g/dL — ABNORMAL LOW (ref 13.0–17.0)
MCH: 32.1 pg (ref 26.0–34.0)
MCHC: 32 g/dL (ref 30.0–36.0)
MCV: 100.3 fL — AB (ref 80.0–100.0)
NRBC: 1.5 % — AB (ref 0.0–0.2)
PLATELETS: 257 10*3/uL (ref 150–400)
RBC: 3.18 MIL/uL — ABNORMAL LOW (ref 4.22–5.81)
RDW: 13.6 % (ref 11.5–15.5)
WBC: 23.3 10*3/uL — ABNORMAL HIGH (ref 4.0–10.5)

## 2018-06-01 LAB — AMYLASE: Amylase: 194 U/L — ABNORMAL HIGH (ref 28–100)

## 2018-06-01 LAB — LIPASE, BLOOD: LIPASE: 32 U/L (ref 11–51)

## 2018-06-01 LAB — MAGNESIUM: MAGNESIUM: 2.6 mg/dL — AB (ref 1.7–2.4)

## 2018-06-01 LAB — PROCALCITONIN: Procalcitonin: 0.5 ng/mL

## 2018-06-01 MED ORDER — PUREFLOW DIALYSIS SOLUTION: INTRAVENOUS | Status: DC

## 2018-06-01 MED ORDER — VANCOMYCIN HCL IN DEXTROSE 1-5 GM/200ML-% IV SOLN: 1000.0000 mg | INTRAVENOUS | Status: DC

## 2018-06-01 MED ORDER — SODIUM CHLORIDE 0.9 % IV SOLN
0.0000 ug/min | INTRAVENOUS | Status: DC
  Administered 2018-06-01: 200 ug/min via INTRAVENOUS
  Filled 2018-06-01 (×2): qty 4

## 2018-06-01 MED ORDER — HYDROCORTISONE NA SUCCINATE PF 100 MG IJ SOLR: 50.0000 mg | Freq: Four times a day (QID) | INTRAMUSCULAR | Status: DC

## 2018-06-01 MED ORDER — SODIUM CHLORIDE 0.9 % IV BOLUS
250.0000 mL | Freq: Once | INTRAVENOUS | Status: AC
  Administered 2018-06-01: 250 mL via INTRAVENOUS

## 2018-06-01 MED ORDER — HYDROCORTISONE NA SUCCINATE PF 100 MG IJ SOLR
100.0000 mg | Freq: Once | INTRAMUSCULAR | Status: AC
  Administered 2018-06-01: 100 mg via INTRAVENOUS
  Filled 2018-06-01 (×2): qty 2

## 2018-06-01 MED ORDER — FENTANYL BOLUS VIA INFUSION
25.0000 ug | INTRAVENOUS | Status: DC | PRN
  Filled 2018-06-01: qty 25

## 2018-06-01 MED ORDER — HEPARIN SODIUM (PORCINE) 5000 UNIT/ML IJ SOLN
INTRAMUSCULAR | Status: AC
  Filled 2018-06-01: qty 2

## 2018-06-01 MED ORDER — FENTANYL CITRATE (PF) 100 MCG/2ML IJ SOLN
200.0000 ug | Freq: Once | INTRAMUSCULAR | Status: AC
  Administered 2018-06-01: 200 ug via INTRAVENOUS

## 2018-06-01 MED ORDER — MIDAZOLAM HCL 2 MG/2ML IJ SOLN: 1.0000 mg | INTRAMUSCULAR | Status: DC | PRN

## 2018-06-01 MED ORDER — MIDAZOLAM HCL 2 MG/2ML IJ SOLN
4.0000 mg | Freq: Once | INTRAMUSCULAR | Status: AC
  Administered 2018-06-01: 4 mg via INTRAVENOUS

## 2018-06-01 MED ORDER — SODIUM BICARBONATE 8.4 % IV SOLN
150.0000 meq | Freq: Once | INTRAVENOUS | Status: AC
  Administered 2018-06-01: 150 meq via INTRAVENOUS
  Filled 2018-06-01: qty 150

## 2018-06-01 MED ORDER — CHLORHEXIDINE GLUCONATE 0.12% ORAL RINSE (MEDLINE KIT): 15.0000 mL | Freq: Two times a day (BID) | OROMUCOSAL | Status: DC

## 2018-06-01 MED ORDER — VASOPRESSIN 20 UNIT/ML IV SOLN
0.0300 [IU]/min | INTRAVENOUS | Status: DC
  Administered 2018-06-01: 0.03 [IU]/min via INTRAVENOUS
  Filled 2018-06-01: qty 2

## 2018-06-01 MED ORDER — NOREPINEPHRINE 16 MG/250ML-% IV SOLN
0.0000 ug/min | INTRAVENOUS | Status: DC
  Administered 2018-06-01: 40 ug/min via INTRAVENOUS
  Filled 2018-06-01: qty 250

## 2018-06-01 MED ORDER — VANCOMYCIN HCL 10 G IV SOLR
2000.0000 mg | Freq: Once | INTRAVENOUS | Status: AC
  Administered 2018-06-01: 2000 mg via INTRAVENOUS
  Filled 2018-06-01: qty 2000

## 2018-06-01 MED ORDER — FENTANYL 2500MCG IN NS 250ML (10MCG/ML) PREMIX INFUSION
25.0000 ug/h | INTRAVENOUS | Status: DC
  Administered 2018-06-01: 200 ug/h via INTRAVENOUS
  Filled 2018-06-01: qty 250

## 2018-06-01 MED ORDER — PANTOPRAZOLE SODIUM 40 MG IV SOLR: 40.0000 mg | Freq: Every day | INTRAVENOUS | Status: DC

## 2018-06-01 MED ORDER — BISACODYL 10 MG RE SUPP: 10.0000 mg | Freq: Every day | RECTAL | Status: DC | PRN

## 2018-06-01 MED ORDER — IPRATROPIUM-ALBUTEROL 0.5-2.5 (3) MG/3ML IN SOLN: 3.0000 mL | Freq: Four times a day (QID) | RESPIRATORY_TRACT | Status: DC

## 2018-06-01 MED ORDER — FENTANYL CITRATE (PF) 100 MCG/2ML IJ SOLN
INTRAMUSCULAR | Status: AC
  Administered 2018-06-01: 200 ug via INTRAVENOUS
  Filled 2018-06-01: qty 4

## 2018-06-01 MED ORDER — FENTANYL CITRATE (PF) 100 MCG/2ML IJ SOLN: 50.0000 ug | Freq: Once | INTRAMUSCULAR | Status: DC

## 2018-06-01 MED ORDER — HEPARIN SODIUM (PORCINE) 1000 UNIT/ML DIALYSIS
1000.0000 [IU] | INTRAMUSCULAR | Status: DC | PRN
  Filled 2018-06-01: qty 6

## 2018-06-01 MED ORDER — MIDAZOLAM HCL 2 MG/2ML IJ SOLN
4.0000 mg | Freq: Once | INTRAMUSCULAR | Status: AC
  Administered 2018-06-01: 4 mg via INTRAVENOUS
  Filled 2018-06-01: qty 4

## 2018-06-01 MED ORDER — SODIUM CHLORIDE 0.9 % IV SOLN
2.0000 g | Freq: Two times a day (BID) | INTRAVENOUS | Status: DC
  Administered 2018-06-01: 2 g via INTRAVENOUS
  Filled 2018-06-01 (×2): qty 2

## 2018-06-01 MED ORDER — SENNOSIDES 8.8 MG/5ML PO SYRP
5.0000 mL | ORAL_SOLUTION | Freq: Two times a day (BID) | ORAL | Status: DC | PRN
  Filled 2018-06-01: qty 5

## 2018-06-01 MED ORDER — MIDAZOLAM HCL 2 MG/2ML IJ SOLN
INTRAMUSCULAR | Status: AC
  Administered 2018-06-01: 4 mg via INTRAVENOUS
  Filled 2018-06-01: qty 4

## 2018-06-01 MED ORDER — ORAL CARE MOUTH RINSE: 15.0000 mL | OROMUCOSAL | Status: DC

## 2018-06-03 LAB — BLOOD GAS, ARTERIAL
Bicarbonate: UNDETERMINED mmol/L (ref 20.0–28.0)
FIO2: 0.4
O2 SAT: UNDETERMINED %
PCO2 ART: 19 mmHg — AB (ref 32.0–48.0)
PH ART: 7.22 — AB (ref 7.350–7.450)
Patient temperature: 37
pO2, Arterial: 80 mmHg — ABNORMAL LOW (ref 83.0–108.0)

## 2018-06-05 ENCOUNTER — Telehealth: Payer: Self-pay

## 2018-06-05 LAB — CULTURE, BLOOD (ROUTINE X 2)
Culture: NO GROWTH
Culture: NO GROWTH
SPECIAL REQUESTS: ADEQUATE
Special Requests: ADEQUATE

## 2018-06-05 NOTE — Telephone Encounter (Signed)
Recieved Death Certificate from ____[employer]______ Delivered/Placed _____nurse box _______

## 2018-06-05 NOTE — Telephone Encounter (Signed)
Death certificate placed in MD folder for completion. CG

## 2018-06-05 NOTE — Telephone Encounter (Signed)
Death Certificate completed and put up front for pickup. April from [employer] aware.

## 2018-06-15 NOTE — Consult Note (Signed)
PHARMACY CONSULT NOTE - INITIAL   Pharmacy Consult for Review of Meds DAILY for adjustment due to CRRT  Patient Measurements: Height: 6' (182.9 cm) Weight: 211 lb 3.2 oz (95.8 kg) IBW/kg (Calculated) : 77.6  Labs: Recent Labs    05/30/18 0206  05/31/18 0106 05/31/18 0952 05/31/18 1759 05/31/18 2143  WBC 19.0*  --  20.0*  --  24.5* 25.5*  HGB 10.3*  --  10.1*  --  10.0* 9.6*  HCT 31.2*  --  30.9*  --  31.1* 30.9*  PLT 207  --  188  --  227 224  APTT 77*   < > 126* 93* 71*  --   CREATININE 1.63*  --  1.71*  --  2.43* 2.73*  MG 2.3  --  2.2  --   --  2.4  PHOS  --   --   --   --   --  4.7*  ALBUMIN  --   --   --   --   --  3.7  PROT  --   --   --   --   --  6.0*  AST  --   --   --   --   --  4,343*  ALT  --   --   --   --   --  2,704*  ALKPHOS  --   --   --   --   --  39  BILITOT  --   --   --   --   --  2.5*   < > = values in this interval not displayed.   Estimated Creatinine Clearance: 24.6 mL/min (A) (by C-G formula based on SCr of 2.73 mg/dL (H)).   Medications:  Scheduled:  . aspirin  81 mg Oral Daily  . atorvastatin  40 mg Oral QHS  . chlorhexidine gluconate (MEDLINE KIT)  15 mL Mouth Rinse BID  . clopidogrel  75 mg Oral Daily  . docusate sodium  100 mg Oral Daily  . escitalopram  10 mg Oral Daily  . fentaNYL (SUBLIMAZE) injection  50 mcg Intravenous Once  . guaiFENesin  600 mg Oral BID  . heparin      . ipratropium-albuterol  3 mL Nebulization Q6H  . magnesium oxide  400 mg Oral Daily  . mouth rinse  15 mL Mouth Rinse 10 times per day  . multivitamin with minerals   Oral Daily  . pantoprazole (PROTONIX) IV  40 mg Intravenous Daily  . sodium chloride flush  10-40 mL Intracatheter Q12H   Infusions:  . ceFEPime (MAXIPIME) IV    . dexmedetomidine (PRECEDEX) IV infusion 1.2 mcg/kg/hr (06/27/18 0300)  . fentaNYL infusion INTRAVENOUS 200 mcg/hr (June 27, 2018 0230)  . heparin 950 Units/hr (06/27/2018 0004)  . magnesium sulfate 1 - 4 g bolus IVPB    . phenylephrine  (NEO-SYNEPHRINE) Adult infusion 200 mcg/min (06-27-2018 0300)  . pureflow    .  sodium bicarbonate (isotonic) infusion in sterile water 75 mL/hr at 05/31/18 2200  . vancomycin    . [START ON 06/02/2018] vancomycin      Assessment: Pharmacy consulted for review of meds DAILY for adjustment due to CRRT. CRRT started 11/17.   Plan:  All medications have been reviewed. Abx have been dosed appropriately for CRRT. No medication adjustments  are necessary at this time.   Pernell Dupre, PharmD, BCPS Clinical Pharmacist 06/27/2018 3:51 AM

## 2018-06-15 NOTE — Consult Note (Signed)
Pharmacy Antibiotic Note  Justin BrownMichael Jabbour is a 84 y.o. male admitted on 07-16-18 with NSTEMI and resp failure.  Pharmacy has been consulted for cefepime and vancomycin dosing for Sepsis. Patient started on CRRT on 11/17.    Plan: Patient now on CRRT. Will order Vancomycin 2g IV x 1 dose followed by Vancomycin 1000 IV every 24 hours. Will draw vanc level prior to 3rd dose. Goal trough 15-20 mcg/mL.  Start Cefepime 2g IV every 12 hours.   Height: 6' (182.9 cm) Weight: 211 lb 3.2 oz (95.8 kg) IBW/kg (Calculated) : 77.6  Temp (24hrs), Avg:98.5 F (36.9 C), Min:97.9 F (36.6 C), Max:99.2 F (37.3 C)  Recent Labs  Lab 05/29/18 0450 05/30/18 0206 05/31/18 0106 05/31/18 1759 05/31/18 2131 05/31/18 2143  WBC 21.3* 19.0* 20.0* 24.5*  --  25.5*  CREATININE 1.17 1.63* 1.71* 2.43*  --  2.73*  LATICACIDVEN  --   --   --   --  9.4*  --     Estimated Creatinine Clearance: 24.6 mL/min (A) (by C-G formula based on SCr of 2.73 mg/dL (H)).    Allergies  Allergen Reactions  . Iodinated Diagnostic Agents Other (See Comments)     Was very "flushed", denies itching, swelling or hives. He states this was over 30 years ago with Ionic contrast and not the current non-ionic contrast. SPM    Antimicrobials this admission: 11/17 vancomycin >>  11/17 Cefepime >>   Microbiology results: 11/16  BCx: pending 11/13 MRSA PCR: negative  Thank you for allowing pharmacy to be a part of this patient's care.  Gardner CandleSheema M Lamyia Cdebaca, PharmD, BCPS Clinical Pharmacist 05/31/2018 3:38 AM

## 2018-06-15 NOTE — Progress Notes (Signed)
CRITICAL CARE NOTE  CC  Severe resp failure  SUBJECTIVE Patient remains critically ill Prognosis is guarded Increased WOB Failed BiPAP Using accessory muscles to breathe Needs emergent intubation and CRRT  Wife at bedside updated    SIGNIFICANT EVENTS    BP 133/79   Pulse 91   Temp 98.2 F (36.8 C) (Oral)   Resp (!) 32   Ht 6' (1.829 m)   Wt 95.8 kg   SpO2 100%   BMI 28.64 kg/m    REVIEW OF SYSTEMS  PATIENT IS UNABLE TO PROVIDE COMPLETE REVIEW OF SYSTEMS DUE TO SEVERE CRITICAL ILLNESS   PHYSICAL EXAMINATION:  GENERAL:critically ill appearing, +resp distress HEAD: Normocephalic, atraumatic.  EYES: Pupils equal, round, reactive to light.  No scleral icterus.  MOUTH: Moist mucosal membrane. NECK: Supple. No thyromegaly. No nodules. No JVD.  PULMONARY: +rhonchi, +wheezing CARDIOVASCULAR: S1 and S2. Regular rate and rhythm. No murmurs, rubs, or gallops.  GASTROINTESTINAL: Soft, nontender, -distended. No masses. Positive bowel sounds. No hepatosplenomegaly.  MUSCULOSKELETAL: +swelling, dressings intact NEUROLOGIC: obtunded, GCS<8 SKIN:intact,warm,dry      Indwelling Urinary Catheter continued, requirement due to   Reason to continue Indwelling Urinary Catheter for strict Intake/Output monitoring for hemodynamic instability   Central Line continued, requirement due to   Reason to continue [employer] Monitoring of central venous pressure or other hemodynamic parameters      ASSESSMENT AND PLAN SYNOPSIS 84 yo white male s/p endarterectomy b/l iliacs with acute NSTEMI with CHF and cardiogenic shock with acute renal failure with progressive resp failure  Severe Hypoxic and Hypercapnic Respiratory Failure Needs emergent intubation Vent support    CARDIAC FAILURE-cardiogenic shock NSTEMI -vasopressors   Renal Failure-most likely due to ATN -follow chem 7 -follow UO -continue Foley Catheter-assess need daily Will need CRRT Will attempt vasc  cath IJ   NEUROLOGY Obtunded Will start sedation  Cardiogenic shock  -use vasopressors to keep MAP>65   CARDIAC ICU monitoring   GI/Nutrition-place OG  GI PROPHYLAXIS as indicated DIET-->place OG and start TF's Constipation protocol as indicated  ENDO - ICU hypoglycemic\Hyperglycemia protocol -check FSBS per protocol   ELECTROLYTES -follow labs as needed -replace as needed -pharmacy consultation and following   DVT/GI PRX ordered TRANSFUSIONS AS NEEDED MONITOR FSBS ASSESS the need for LABS as needed   Critical Care Time devoted to patient care services described in this note is 45 minutes.   Overall, patient is critically ill, prognosis is guarded.  Patient with Multiorgan failure and at high risk for cardiac arrest and death.    Lucie LeatherKurian David Jameire Kouba, M.D.  [facility]  Medical Director [facility] Medical Director [facility]

## 2018-06-15 NOTE — Procedures (Signed)
Endotracheal Intubation: Patient required placement of an artificial airway secondary to Respiratory Failure  Consent: Emergent.   Hand washing performed prior to starting the procedure.   Medications administered for sedation prior to procedure:  Midazolam 4 mg IV,   Fentanyl 100 mcg IV.    A time out procedure was called and correct patient, name, & ID confirmed. Needed supplies and equipment were assembled and checked to include ETT, 10 ml syringe, Glidescope, Mac and Miller blades, suction, oxygen and bag mask valve, end tidal CO2 monitor.   Patient was positioned to align the mouth and pharynx to facilitate visualization of the glottis.   Heart rate, SpO2 and blood pressure was continuously monitored during the procedure. Pre-oxygenation was conducted prior to intubation and endotracheal tube was placed through the vocal cords into the trachea.     The artificial airway was placed under direct visualization via glidescope route using a 7.5  ETT on the first attempt.  ETT was secured at 23 cm mark.  Placement was confirmed by auscuitation of lungs with good breath sounds bilaterally and no stomach sounds.  Condensation was noted on endotracheal tube.   Pulse ox 98%.  CO2 detector in place with appropriate color change.   Complications: None .   Operator: Junie Avilla.   Chest radiograph ordered and pending.   Comments: OGT placed via glidescope.  Corrin Parker, M.D.  [facility]  Medical Director [place] Director [facility] Cardio-Pulmonary Department

## 2018-06-15 NOTE — Procedures (Signed)
Central Venous Dailysis Catheter Placement: DOUBLE LUMEN  °Indication: Hemo Dialysis/CRRT ° ° °Consent:emergent ° ° °Hand washing performed prior to starting the procedure.  ° °Procedure: An active timeout was performed and correct patient, name, & ID confirmed. Patient was positioned correctly for central venous access. Patient was prepped using strict sterile technique including chlorohexadine preps, sterile drape, sterile gown and sterile gloves.  The area was prepped, draped and anesthetized in the usual sterile manner. Patient comfort was obtained.   ° °A Double lumen catheter was placed in RT IJ Vein There was good blood return, catheter caps were placed on lumens, catheter flushed easily, the line was secured and a sterile dressing and BIO-PATCH applied.  ° °Ultrasound was used to visualize vasculature and guidance of needle.  ° °Number of Attempts: 1 °Complications:none ° °Estimated Blood Loss: none °Operator: Marrell Dicaprio. ° ° °Tal Kempker David Torrence Branagan, M.D.  °[facility]  °Medical Director [facility] °Medical Director [facility]  ° ° ° °

## 2018-06-15 NOTE — Death Summary Note (Signed)
DEATH SUMMARY   Patient Details  Name: Justin Mendez MRN: [id number] DOB: October 05, 1932  Admission/Discharge Information   Admit Date:  2018-06-22  Date of Death:  06/26/2018   Time of Death:  0647  Length of Stay: 4  Referring Physician: Danella Penton, MD   Reason(s) for Hospitalization  bilateral femoral endarterectomies with external iliac endarterectomies, bilateral common iliac artery stenting with stenting of the external iliac artery on 06-22-18  Diagnoses  Preliminary cause of death: Cardiogenic shock Secondary Diagnoses (including complications and co-morbidities):  Active Problems:   Atherosclerosis of native arteries of extremity with rest pain (HCC)   NSTEMI (non-ST elevated myocardial infarction) (HCC)   Cardiogenic shock (HCC)   Respiratory failure, acute (HCC)   Renal failure Severe cardiorenal syndrome with worsening creatinine and cardiogenic shock Lactic acidosis Severe metabolic acidosis Acute delirium Atherosclerotic occlusive disease bilateral lower extremities with lifestyle limiting claudication and severe rest pain symptoms Hypertension Coronary artery disease  Brief Hospital Course (including significant findings, care, treatment, and services provided and events leading to death)  Justin Mendez is a 84 y.o. year old male with with ischemic cardiomyopathy with an EF of 25-30%, severe three vessel CAD not amenable to CABG due to nonviable anterior wall, CHB s/p pacemaker, and severe PVD/PAD who underwent a bilateral femoral endarterectomies with external iliac endarterectomies, bilateral common iliac artery stenting with stenting of the external iliac artery on 06/22/18. Post procedure, he developed chest pain. His troponin peaked at >65. He was seen by cardiology and started on a heparin infusion for non-ST elevation MI.  He was not deemed appropriate for left heart catheterization and PCI based per Dr. America Brown note.  He subsequently developed  cardiogenic shock with accompanying cardiorenal syndrome.  He was seen by nephrology and the plan was to monitor his creatinine and if on a continuous upward trend will initiate continuous renal replacement therapy.  Last evening, patient became  severely agitated and attempting to get out of bed and also c/o difficulty breathing. Patient was assisted back to bed and started on precedex for agitation. STAT labs revealed severe metabolic acidosis and worsening creatinine level.  Blood cultures had been obtained for worsening leukocytosis. DIC panel, and procalcitonin were all negative.  He was given bicarb pushes x3 ampoules and the started on a bicarb infusion.  He was also placed on CPAP per his home settings.  Patient was calm with stable vital signs for about an hour and a half and then he became acutely agitated again.  Despite interventions, patient remained tachypneic and hypotensive with severe lactic and metabolic acidosis necessitating emergent intubation.  Patient was successfully intubated and a right internal jugular vascular catheter was placed for initiation of CRRT.  His post intubation ABG was normal and chest x-ray showed mild pulmonary edema with appropriate placement of right internal jugular vascular catheter and ET tube.  Was unremarkable.  About 2 hours post intubation,  patient went into refractory shock requiring 3 pressors-norepinephrine, phenylephrine and vasopressin.  Was also started on empiric antibiotics.  He was given additional doses of bicarb, Solu-Cortef, NS bolus 250cc x 1.  Despite interventions, patient continued to deteriorate.  Family did not want him resuscitated.  A DNR order was placed.  Shortly after that, patient  went into cardiac arrest and was pronounced at 6:47 AM.  Pertinent Labs and Studies  Significant Diagnostic Studies Dg Abd 1 View  Result Date: 26-Jun-2018 CLINICAL DATA:  Nasogastric tube placement. EXAM: ABDOMEN - 1 VIEW COMPARISON:  None. FINDINGS:  The  patient's enteric tube is noted ending overlying the antrum of the stomach. The visualized bowel gas pattern is nonspecific, with air and stool filled loops of colon. No free intra-abdominal air is seen on the provided upright view. No acute osseous abnormalities are identified. IMPRESSION: 1. Enteric tube noted ending overlying the antrum of the stomach. 2. Nonspecific bowel gas pattern; no free intra-abdominal air seen. Electronically Signed   By: Roanna RaiderJeffery  Chang M.D.   On: 03/13/2018 03:42   Portable Chest X-ray  Result Date: 03/13/2018 CLINICAL DATA:  Endotracheal tube placement. EXAM: PORTABLE CHEST 1 VIEW COMPARISON:  Chest radiograph performed 05/31/2018 FINDINGS: The patient's endotracheal tube is seen ending 3 cm above the carina. An enteric tube is noted extending below the diaphragm. A right IJ line is noted ending about the mid SVC. A right PICC is also noted ending about the mid SVC. Mild vascular congestion is noted. A small left pleural effusion is suspected. No pneumothorax is seen. Increased interstitial markings may reflect mild interstitial edema. Peribronchial thickening is noted. The cardiomediastinal silhouette is borderline enlarged. A pacemaker is noted overlying the left chest wall, with leads extending overlying the right atrium and right ventricle. No acute osseous abnormalities are seen. IMPRESSION: 1. Endotracheal tube seen ending 3 cm above the carina. 2. Mild vascular congestion and borderline cardiomegaly. Small left pleural effusion suspected. Increased interstitial markings may reflect mild interstitial edema. Electronically Signed   By: Roanna RaiderJeffery  Chang M.D.   On: 03/13/2018 03:41   Dg Chest Port 1 View  Result Date: 05/31/2018 CLINICAL DATA:  Acute onset of dyspnea. EXAM: PORTABLE CHEST 1 VIEW COMPARISON:  Chest radiograph performed earlier today at 7:04 a.m. FINDINGS: The lungs are well-aerated. Vascular congestion is noted. Increased interstitial markings raise concern  for mild interstitial edema. There is no evidence of focal opacification, pleural effusion or pneumothorax. The cardiomediastinal silhouette is mildly enlarged. A pacemaker is noted overlying the left chest wall, with leads ending overlying the right atrium and right ventricle. No acute osseous abnormalities are seen. Cervical spinal fusion hardware is partially imaged. The right PICC is noted ending at the distal SVC. IMPRESSION: Vascular congestion and mild cardiomegaly. Increased interstitial markings raise concern for mild interstitial edema. Electronically Signed   By: Roanna RaiderJeffery  Chang M.D.   On: 05/31/2018 21:39   Dg Chest Port 1 View  Result Date: 05/31/2018 CLINICAL DATA:  Dyspnea EXAM: PORTABLE CHEST 1 VIEW COMPARISON:  05/30/2018 FINDINGS: Cardiac shadow is mildly enlarged but stable. Pacing device is again seen and stable. Right-sided PICC line is noted in the mid superior vena cava new from the prior exam. Postsurgical changes in the cervical spine are seen. The lungs are well aerated bilaterally. No focal infiltrate or sizable effusion is seen. No acute bony abnormality is noted. IMPRESSION: PICC line in satisfactory position on the right. No acute abnormality noted. Electronically Signed   By: Alcide CleverMark  Lukens M.D.   On: 05/31/2018 07:27   Dg Chest Port 1 View  Result Date: 05/30/2018 CLINICAL DATA:  History of cardiomyopathy, paroxysmal atrial fibrillation, coronary artery disease, aortic stenosis. Status post bilateral femoral endarterectomy with iliac stent placement. EXAM: PORTABLE CHEST 1 VIEW COMPARISON:  Portable chest x-ray of May 28, 2018 FINDINGS: The lungs remain hyperinflated. The interstitial markings are coarse. The pulmonary vascularity is slightly more prominent today. The cardiac silhouette is enlarged and also slightly more conspicuous. The ICD is in stable position. There is no pleural effusion or alveolar pneumonia. IMPRESSION: COPD. No acute pneumonia  nor definite  pulmonary edema. Mild enlargement of the cardiac silhouette with slight increased prominence of the central pulmonary vascularity may reflect low-grade compensated CHF. Electronically Signed   By: David  [place] M.D.   On: 05/30/2018 09:42   Dg Chest Port 1 View  Result Date: 06/24/2018 CLINICAL DATA:  Hypoxia EXAM: PORTABLE CHEST 1 VIEW COMPARISON:  January 03, 2018 FINDINGS: Stable cardiomegaly. Stable pacemaker leads. The heart, hila, mediastinum, lungs, and pleura are otherwise unchanged and unremarkable with no acute abnormalities. IMPRESSION: No active disease. Electronically Signed   By: Gerome Sam III M.D   On: 06-24-2018 22:34   Dg C-arm 1-60 Min  Result Date: 06-24-18 CLINICAL DATA:  84 year old male with peripheral arterial disease undergoing arteriography and intervention EXAM: DG C-ARM 61-120 MIN COMPARISON:  None. FINDINGS: A total of 14 intraoperative saved images are submitted for review. The images document balloon angioplasty of kissing common iliac artery stents, placement of a left external iliac artery stent and placement of a right external iliac artery stent. Of note, on the right to the superficial femoral artery is severely diseased and nearly occluded. IMPRESSION: Placement of kissing iliac stents and bilateral external iliac stents. The right superficial femoral artery is heavily diseased and nearly occluded. Please see operative note for full detail. Electronically Signed   By: Malachy Moan M.D.   On: June 24, 2018 12:59   [place] Ekg Site Rite  Result Date: 05/30/2018 If Site Rite image not attached, placement could not be confirmed due to current cardiac rhythm.   Microbiology Recent Results (from the past 240 hour(s))  MRSA PCR Screening     Status: None   Collection Time: June 24, 2018  9:18 PM  Result Value Ref Range Status   MRSA by PCR NEGATIVE NEGATIVE Final    Comment:        The GeneXpert MRSA Assay (FDA approved for NASAL specimens only), is one component  of a comprehensive MRSA colonization surveillance program. It is not intended to diagnose MRSA infection nor to guide or monitor treatment for MRSA infections. Performed at [facility], [address] [place], [place] [address]     Lab Basic Metabolic Panel: Recent Labs  Lab 05/30/18 0206 05/31/18 0106 05/31/18 1759 05/31/18 2143 06/07/2018 0440  NA 139 138 136 137 141  K 4.3 4.6 4.9 4.7 5.3*  CL 109 106 106 106 104  CO2 21* 24 18* 13* 22  GLUCOSE 187* 127* 131* 157* 67*  BUN 37* 40* 55* 57* 63*  CREATININE 1.63* 1.71* 2.43* 2.73* 3.01*  CALCIUM 8.9 8.8* 8.6* 8.5* 8.5*  MG 2.3 2.2  --  2.4 2.6*  PHOS  --   --   --  4.7* 4.6   Liver Function Tests: Recent Labs  Lab 05/31/18 2143 06/09/2018 0440  AST 4,343*  --   ALT 2,704*  --   ALKPHOS 39  --   BILITOT 2.5*  --   PROT 6.0*  --   ALBUMIN 3.7 3.6   Recent Labs  Lab 06/07/2018 0440  LIPASE 32  AMYLASE 194*   No results for input(s): AMMONIA in the last 168 hours. CBC: Recent Labs  Lab 05/26/18 1509  06-24-2018 1840  05/30/18 0206 05/31/18 0106 05/31/18 1759 05/31/18 2143 05/26/2018 0440  WBC 8.3   < > 15.1*   < > 19.0* 20.0* 24.5* 25.5* 23.3*  NEUTROABS 5.2  --  13.7*  --   --   --   --   --   --  HGB 13.7   < > 11.1*   < > 10.3* 10.1* 10.0* 9.6* 10.2*  HCT 40.0   < > 33.4*   < > 31.2* 30.9* 31.1* 30.9* 31.9*  MCV 92.8   < > 93.6   < > 95.1 97.8 98.7 103.0* 100.3*  PLT 217   < > 182   < > 207 188 227 224 257   < > = values in this interval not displayed.   Cardiac Enzymes: Recent Labs  Lab 05/29/18 1851 05/30/18 0206 05/30/18 0716 05/30/18 1435 05/31/18 2143  TROPONINI 0.45* 27.73* >65.00* >65.00* 56.17*   Sepsis Labs: Recent Labs  Lab 05/30/18 0206 05/31/18 0106 05/31/18 1759 05/31/18 2131 05/31/18 2143 2018/06/19 0440  PROCALCITON <0.10 <0.10 0.16  --   --  0.50  WBC 19.0* 20.0* 24.5*  --  25.5* 23.3*  LATICACIDVEN  --   --   --  9.4*  --   --     Procedures/Operations   Endotracheal intubation: 06/19/18 Right internal jugular dialysis catheter: 06/19/2018  1. Bilateral common femoral, superficial femoral and profunda femoris endarterectomy with Cormatrix patch angioplasty 2. Bilateral external iliac artery endarterectomy with via bond stent placement to tack down the leading edge of the plaque 3. Bilateral common iliac artery angioplasty and stent placement using the kissing balloon technique with Dr. Wyn Quaker performing the stent from the right side and myself performing the left stent 4. Introduction catheter into aorta bilateral femoral approach with Dr. Wyn Quaker performing the right side and myself performing the left side 5. Right iliofemoral femoral angiography performed by Dr. Wyn Quaker 6. Left iliofemoral angiography performed by Dr. Eveline Keto Tukov-Yual 2018-06-19, 7:21 AM

## 2018-06-15 DEATH — deceased

## 2019-11-13 IMAGING — DX DG CHEST 1V PORT
2 series · 2 of 2 positions shown · non-contrast
Comparison: Portable chest x-ray May 28, 2018

CLINICAL DATA: History of cardiomyopathy, paroxysmal atrial
fibrillation, coronary artery disease, aortic stenosis. Status post
bilateral femoral endarterectomy with iliac stent placement.

EXAM:
PORTABLE CHEST 1 VIEW

[chest ap (1 of 2)]
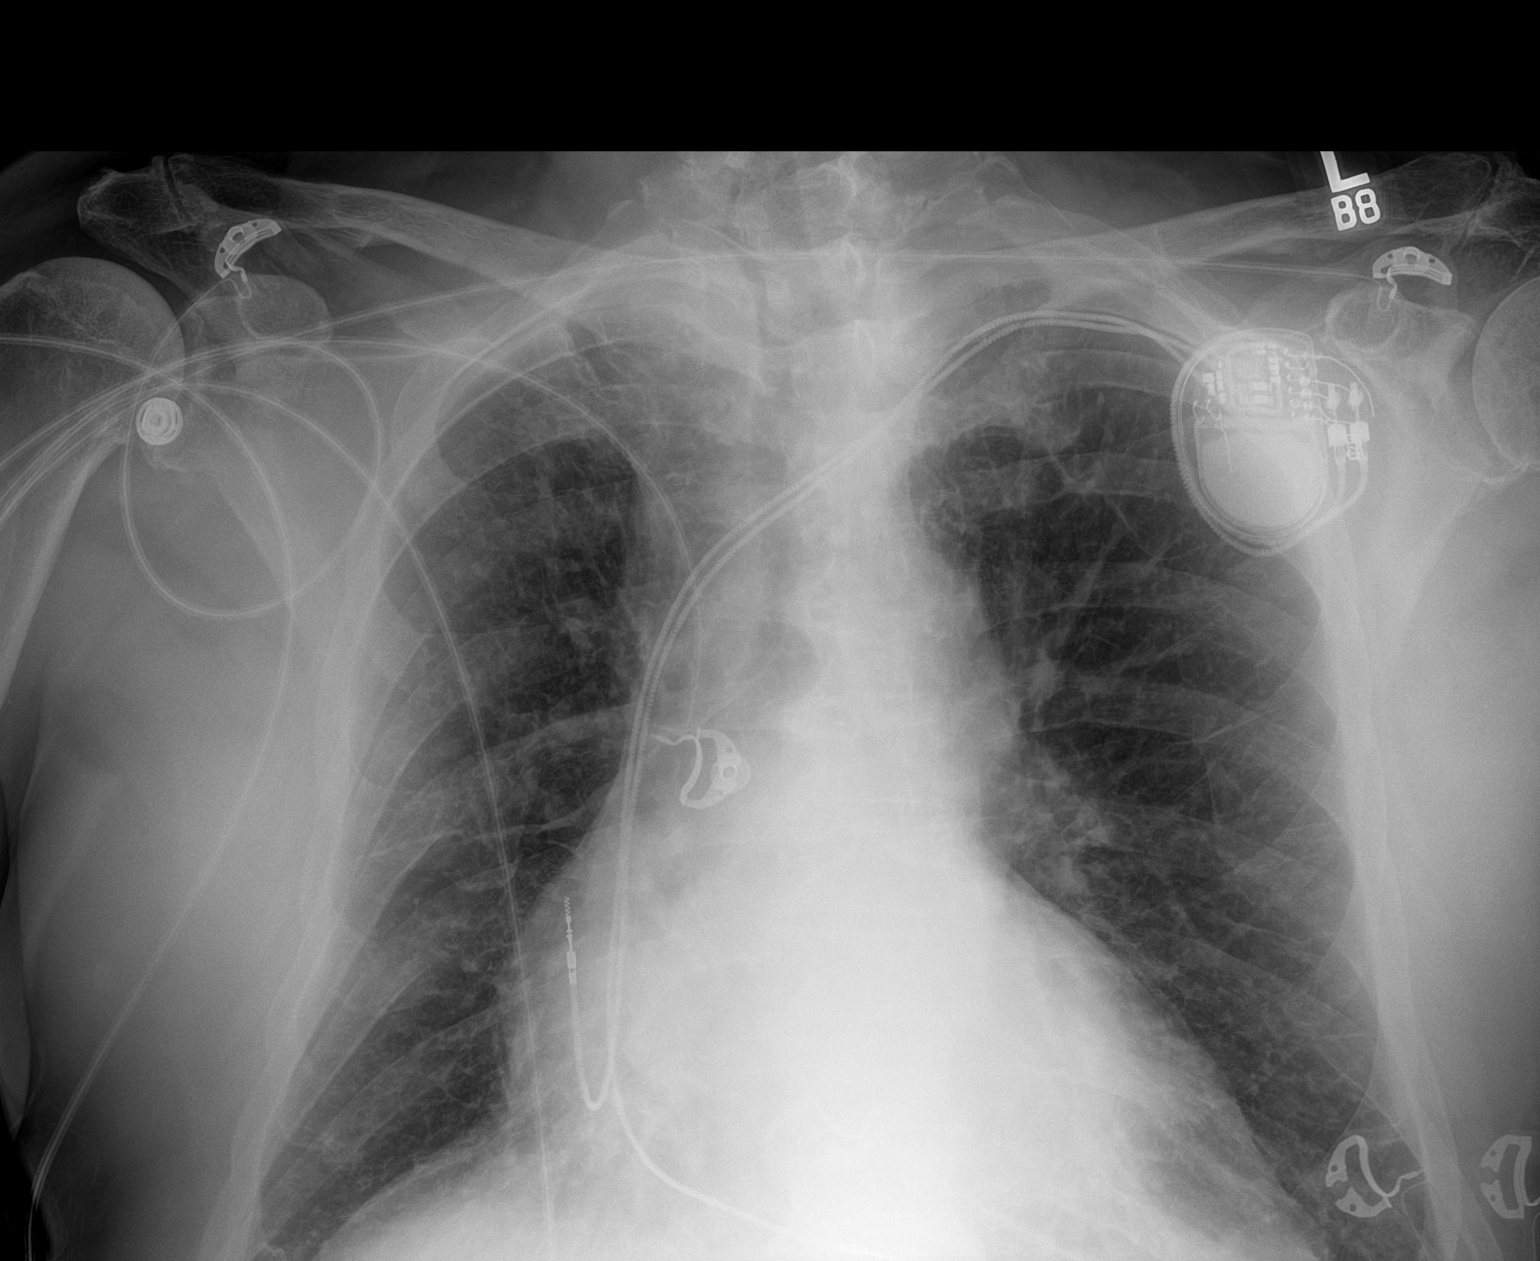

[chest ap (2 of 2)]
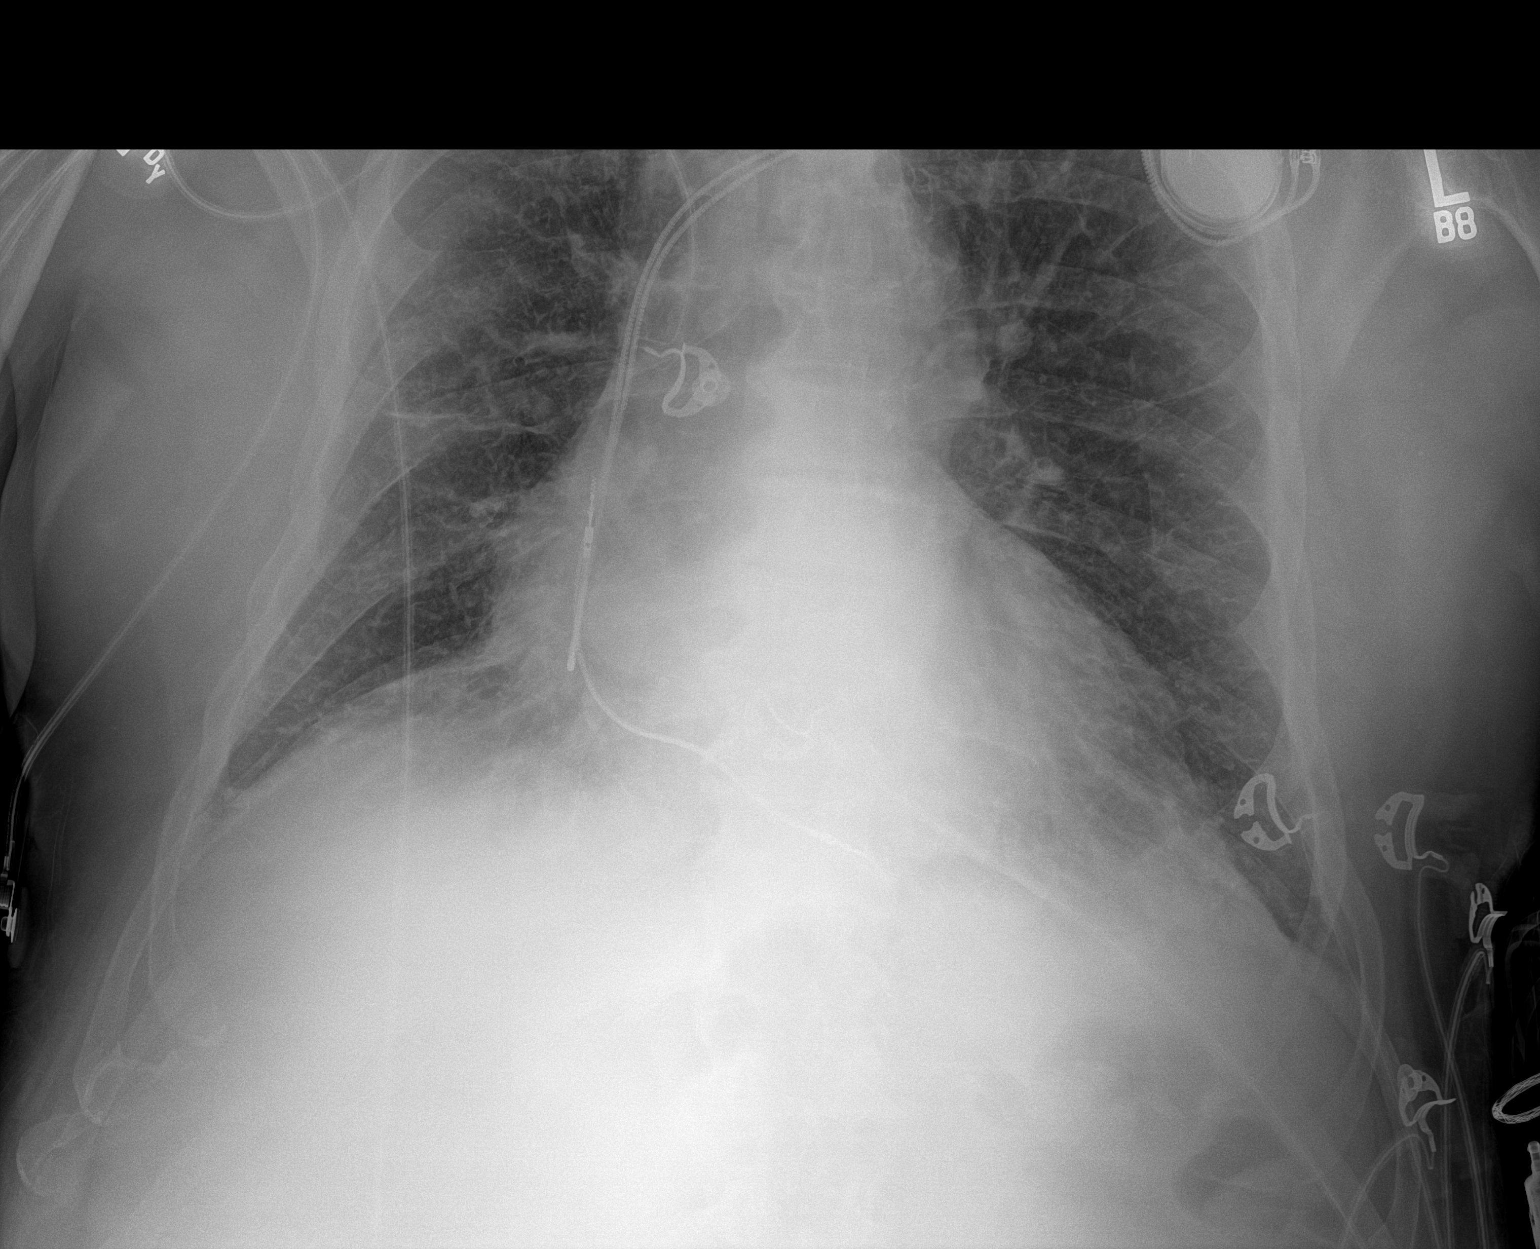

[2 of 2 positions shown; findings below may reference images not displayed]

FINDINGS: The lungs remain hyperinflated. The interstitial markings are
coarse. The pulmonary vascularity is slightly more prominent today.
The cardiac silhouette is enlarged and also slightly more
conspicuous. The ICD is in stable position. There is no pleural
effusion or alveolar pneumonia.
IMPRESSION: COPD. No acute pneumonia nor definite pulmonary edema. Mild
enlargement of the cardiac silhouette with slight increased
prominence of the central pulmonary vascularity may reflect
low-grade compensated CHF.

## 2019-11-14 IMAGING — DX DG CHEST 1V PORT
1 series · 1 of 1 positions shown · non-contrast
Comparison: Chest radiograph performed earlier today at [DATE] a.m.

CLINICAL DATA: Acute onset of dyspnea.

EXAM:
PORTABLE CHEST 1 VIEW

[chest ap]
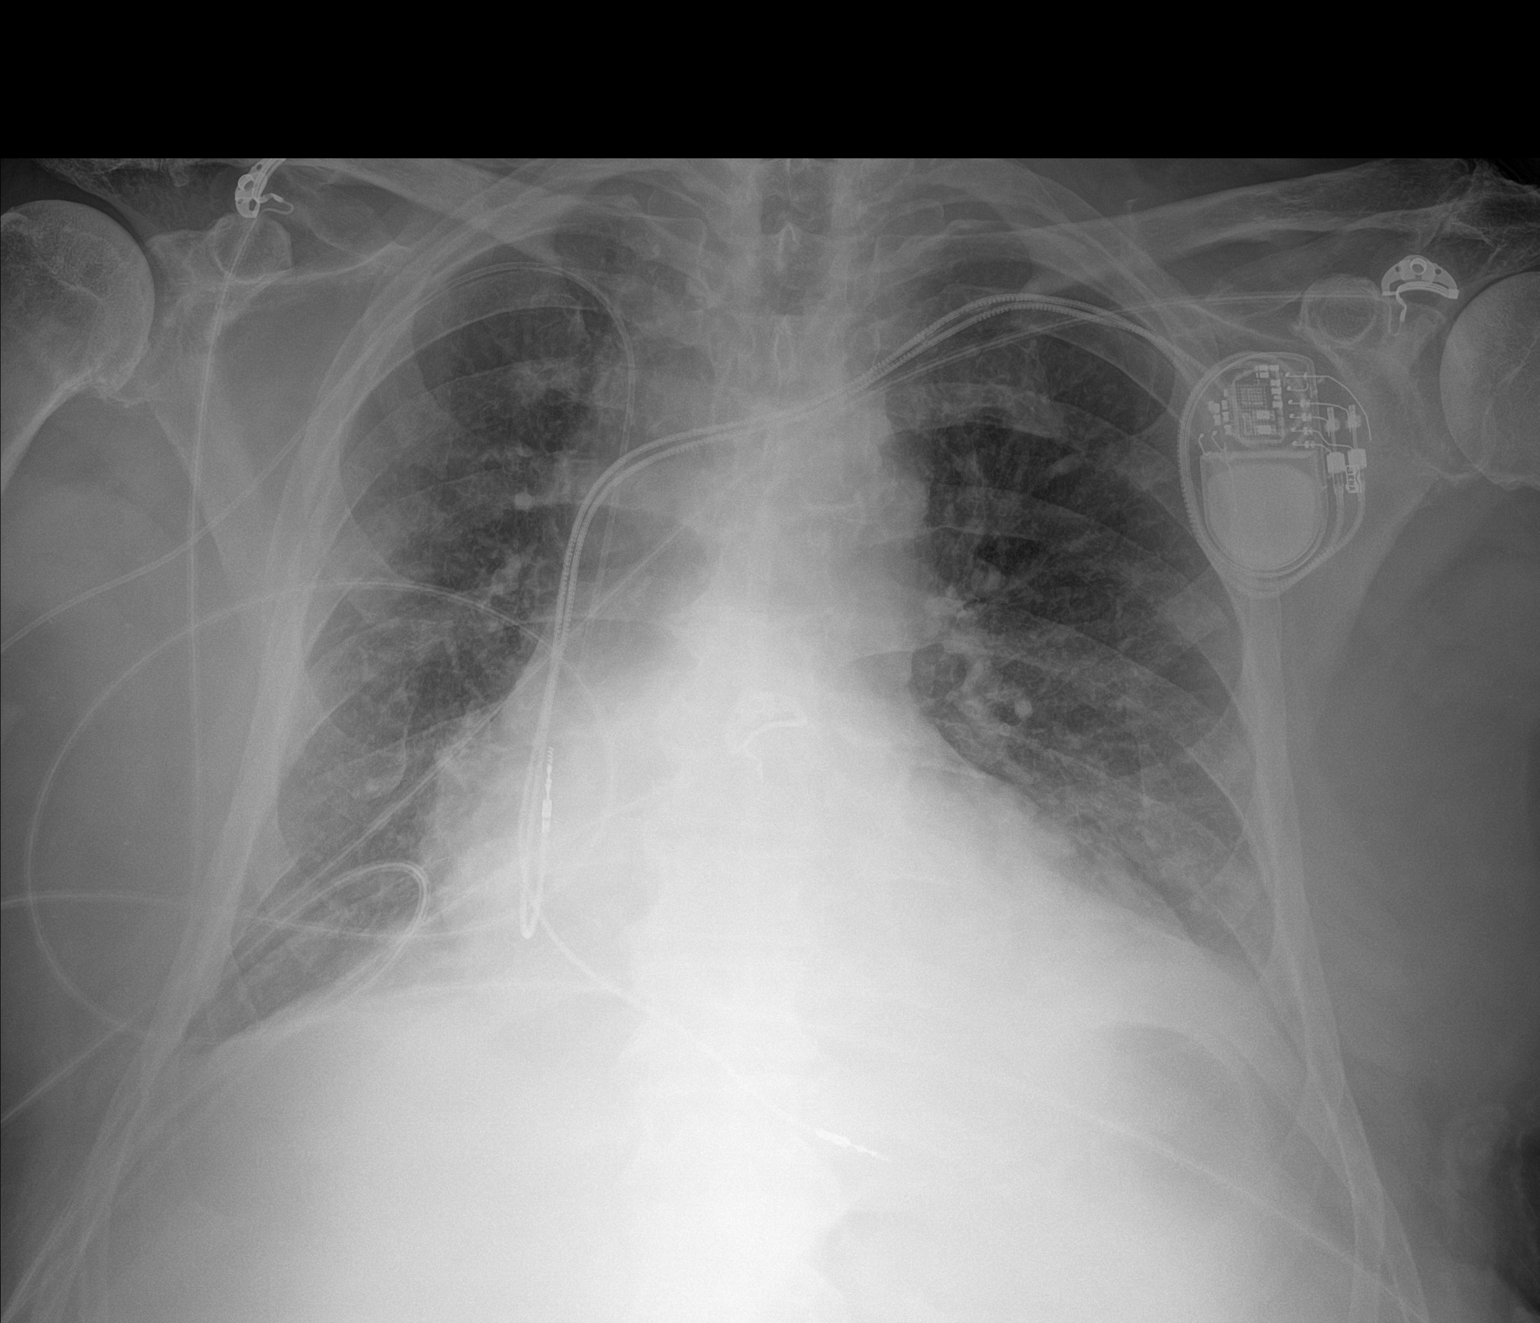

[1 of 1 positions shown; findings below may reference images not displayed]

FINDINGS: The lungs are well-aerated. Vascular congestion is noted. Increased
interstitial markings raise concern for mild interstitial edema.
There is no evidence of focal opacification, pleural effusion or
pneumothorax.

The cardiomediastinal silhouette is mildly enlarged. A pacemaker is
noted overlying the left chest wall, with leads ending overlying the
right atrium and right ventricle. No acute osseous abnormalities are
seen. Cervical spinal fusion hardware is partially imaged.

The right PICC is noted ending at the distal SVC.
IMPRESSION: Vascular congestion and mild cardiomegaly. Increased interstitial
markings raise concern for mild interstitial edema.

## 2019-11-14 IMAGING — DX DG CHEST 1V PORT
1 series · 1 of 1 positions shown · non-contrast
Comparison: 05/30/2018

CLINICAL DATA: Dyspnea

EXAM:
PORTABLE CHEST 1 VIEW

[chest ap]
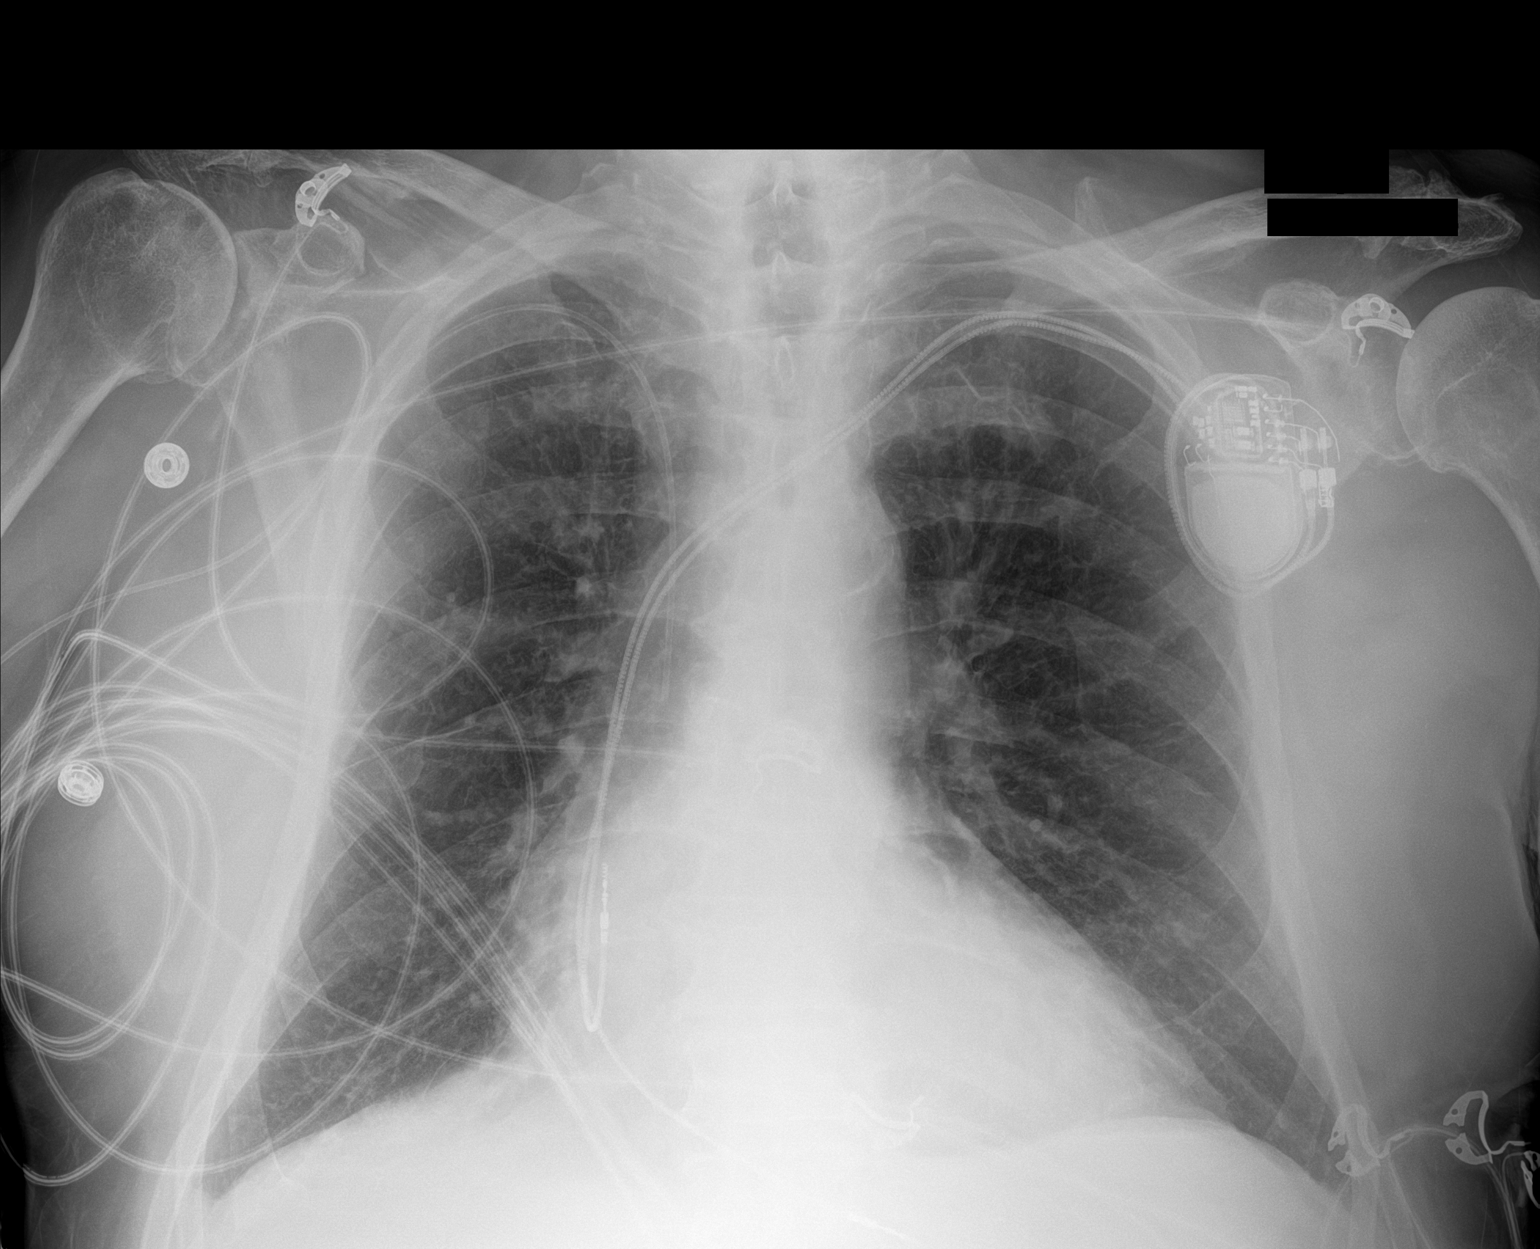

[1 of 1 positions shown; findings below may reference images not displayed]

FINDINGS: Cardiac shadow is mildly enlarged but stable. Pacing device is again
seen and stable. Right-sided PICC line is noted in the mid superior
vena cava new from the prior exam. Postsurgical changes in the
cervical spine are seen. The lungs are well aerated bilaterally. No
focal infiltrate or sizable effusion is seen. No acute bony
abnormality is noted.
IMPRESSION: PICC line in satisfactory position on the right.

No acute abnormality noted.

## 2019-11-15 IMAGING — DX DG ABDOMEN 1V
1 series · 1 of 1 positions shown · non-contrast
Comparison: None.

CLINICAL DATA: Nasogastric tube placement.

EXAM:
ABDOMEN - 1 VIEW

[abdomen supine]
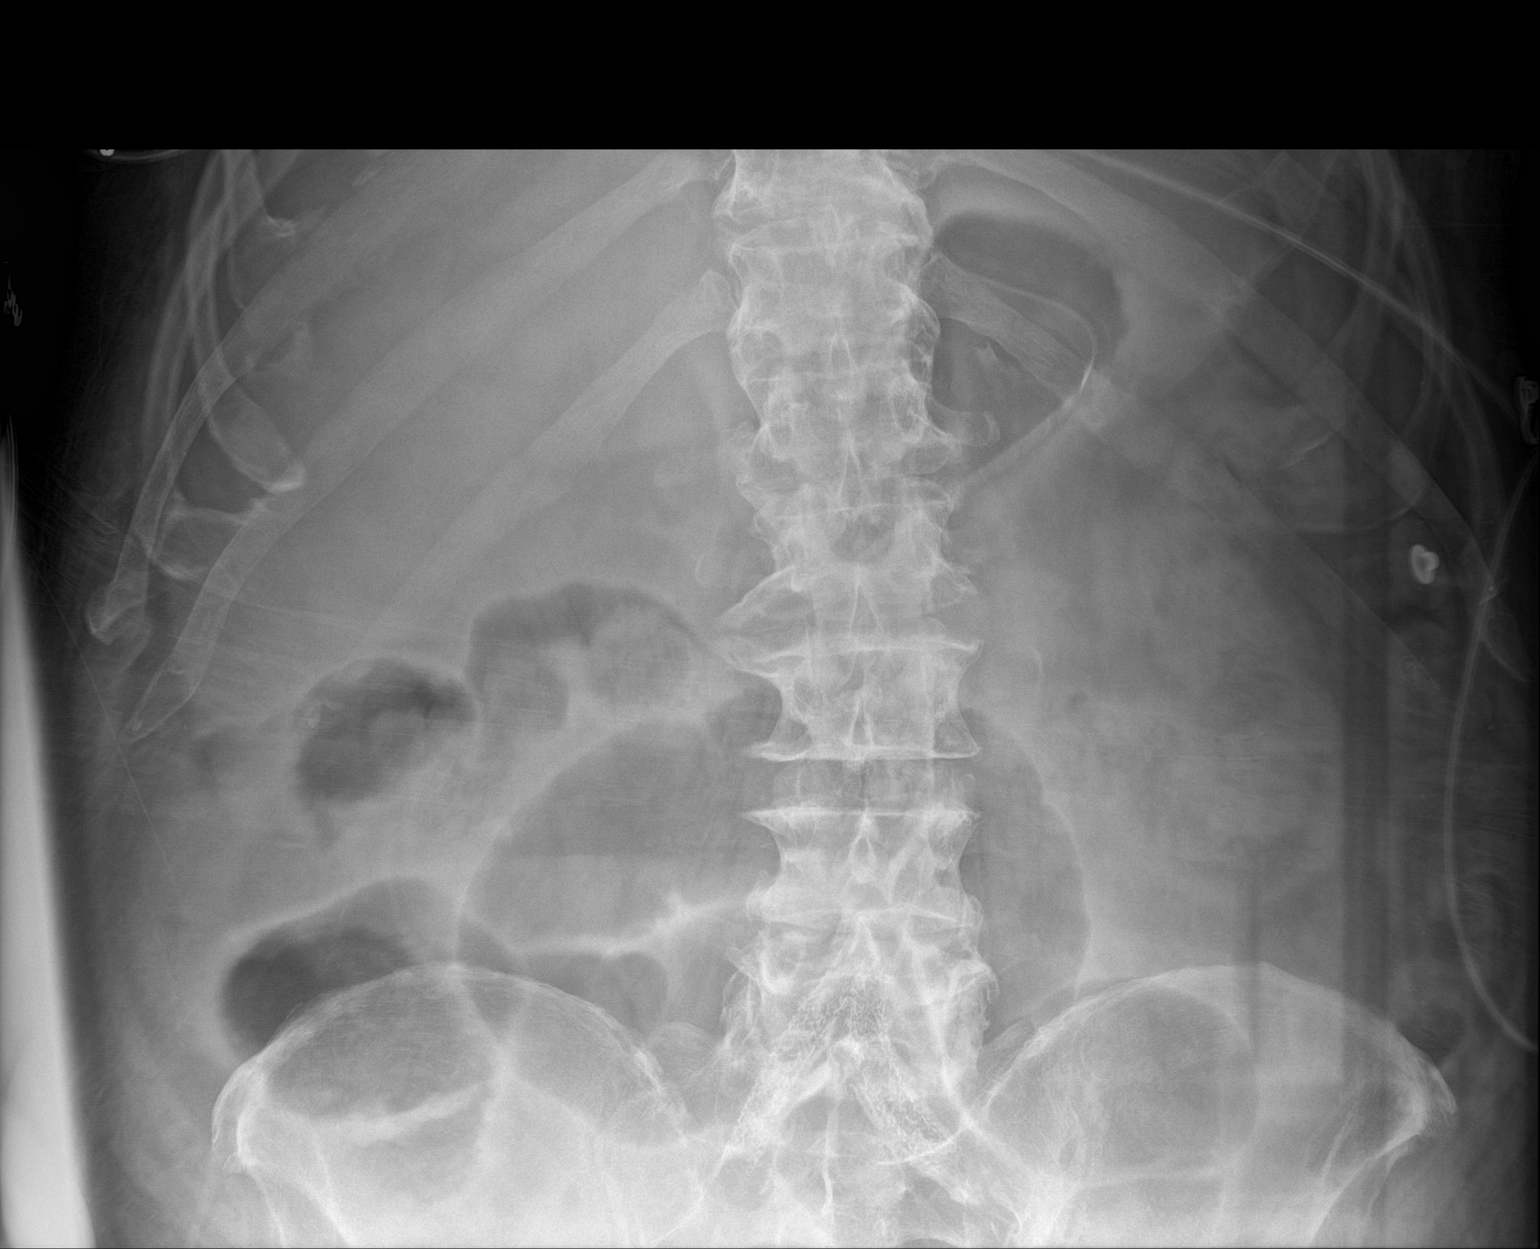

[1 of 1 positions shown; findings below may reference images not displayed]

FINDINGS: The patient's enteric tube is noted ending overlying the antrum of
the stomach.

The visualized bowel gas pattern is nonspecific, with air and stool
filled loops of colon. No free intra-abdominal air is seen on the
provided upright view. No acute osseous abnormalities are
identified.
IMPRESSION: 1. Enteric tube noted ending overlying the antrum of the stomach.
2. Nonspecific bowel gas pattern; no free intra-abdominal air seen.
